# Patient Record
Sex: Female | Born: 1940 | Race: White | Hispanic: No | Marital: Married | Smoking: Never smoker
Health system: Southern US, Community
[De-identification: ages and names within clinical notes are randomized; demographics above are authoritative.]

## PROBLEM LIST (undated history)

## (undated) DIAGNOSIS — T7840XA Allergy, unspecified, initial encounter: Secondary | ICD-10-CM

## (undated) DIAGNOSIS — B019 Varicella without complication: Secondary | ICD-10-CM

## (undated) DIAGNOSIS — K921 Melena: Secondary | ICD-10-CM

## (undated) DIAGNOSIS — N390 Urinary tract infection, site not specified: Secondary | ICD-10-CM

## (undated) DIAGNOSIS — N189 Chronic kidney disease, unspecified: Secondary | ICD-10-CM

## (undated) DIAGNOSIS — J189 Pneumonia, unspecified organism: Secondary | ICD-10-CM

## (undated) DIAGNOSIS — N200 Calculus of kidney: Secondary | ICD-10-CM

## (undated) DIAGNOSIS — K5900 Constipation, unspecified: Secondary | ICD-10-CM

## (undated) DIAGNOSIS — K219 Gastro-esophageal reflux disease without esophagitis: Secondary | ICD-10-CM

## (undated) DIAGNOSIS — K449 Diaphragmatic hernia without obstruction or gangrene: Secondary | ICD-10-CM

## (undated) DIAGNOSIS — M1990 Unspecified osteoarthritis, unspecified site: Secondary | ICD-10-CM

## (undated) HISTORY — PX: DILATION AND CURETTAGE OF UTERUS: SHX78

## (undated) HISTORY — DX: Urinary tract infection, site not specified: N39.0

## (undated) HISTORY — PX: LITHOTRIPSY: SUR834

## (undated) HISTORY — DX: Melena: K92.1

## (undated) HISTORY — DX: Varicella without complication: B01.9

## (undated) HISTORY — DX: Calculus of kidney: N20.0

## (undated) HISTORY — DX: Allergy, unspecified, initial encounter: T78.40XA

---
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
## 1966-01-11 DIAGNOSIS — J189 Pneumonia, unspecified organism: Secondary | ICD-10-CM

## 1966-01-11 HISTORY — DX: Pneumonia, unspecified organism: J18.9

## 1998-10-20 ENCOUNTER — Encounter: Admission: RE | Admit: 1998-10-20 | Discharge: 1998-10-20 | Payer: Self-pay | Admitting: Obstetrics and Gynecology

## 1999-01-08 ENCOUNTER — Encounter: Payer: Self-pay | Admitting: Obstetrics and Gynecology

## 1999-01-08 ENCOUNTER — Encounter: Admission: RE | Admit: 1999-01-08 | Discharge: 1999-01-08 | Payer: Self-pay | Admitting: Obstetrics and Gynecology

## 2000-01-11 ENCOUNTER — Encounter: Admission: RE | Admit: 2000-01-11 | Discharge: 2000-01-11 | Payer: Self-pay | Admitting: Obstetrics and Gynecology

## 2000-01-11 ENCOUNTER — Encounter: Payer: Self-pay | Admitting: Obstetrics and Gynecology

## 2000-01-21 ENCOUNTER — Encounter: Payer: Self-pay | Admitting: Obstetrics and Gynecology

## 2000-01-21 ENCOUNTER — Encounter: Admission: RE | Admit: 2000-01-21 | Discharge: 2000-01-21 | Payer: Self-pay | Admitting: Obstetrics and Gynecology

## 2001-01-30 ENCOUNTER — Encounter: Admission: RE | Admit: 2001-01-30 | Discharge: 2001-01-30 | Payer: Self-pay | Admitting: Obstetrics and Gynecology

## 2001-01-30 ENCOUNTER — Encounter: Payer: Self-pay | Admitting: Obstetrics and Gynecology

## 2002-01-31 ENCOUNTER — Encounter: Admission: RE | Admit: 2002-01-31 | Discharge: 2002-01-31 | Payer: Self-pay | Admitting: Obstetrics and Gynecology

## 2002-01-31 ENCOUNTER — Encounter: Payer: Self-pay | Admitting: Obstetrics and Gynecology

## 2003-02-12 ENCOUNTER — Encounter: Admission: RE | Admit: 2003-02-12 | Discharge: 2003-02-12 | Payer: Self-pay | Admitting: Obstetrics and Gynecology

## 2004-02-17 ENCOUNTER — Encounter: Admission: RE | Admit: 2004-02-17 | Discharge: 2004-02-17 | Payer: Self-pay | Admitting: Obstetrics and Gynecology

## 2004-08-05 ENCOUNTER — Encounter (INDEPENDENT_AMBULATORY_CARE_PROVIDER_SITE_OTHER): Payer: Self-pay | Admitting: *Deleted

## 2004-08-05 ENCOUNTER — Ambulatory Visit (HOSPITAL_BASED_OUTPATIENT_CLINIC_OR_DEPARTMENT_OTHER): Admission: RE | Admit: 2004-08-05 | Discharge: 2004-08-05 | Payer: Self-pay | Admitting: Urology

## 2004-08-05 ENCOUNTER — Ambulatory Visit (HOSPITAL_COMMUNITY): Admission: RE | Admit: 2004-08-05 | Discharge: 2004-08-05 | Payer: Self-pay | Admitting: Urology

## 2005-02-19 ENCOUNTER — Encounter: Admission: RE | Admit: 2005-02-19 | Discharge: 2005-02-19 | Payer: Self-pay | Admitting: Obstetrics and Gynecology

## 2006-03-02 ENCOUNTER — Encounter: Admission: RE | Admit: 2006-03-02 | Discharge: 2006-03-02 | Payer: Self-pay | Admitting: Obstetrics and Gynecology

## 2007-02-02 ENCOUNTER — Encounter: Admission: RE | Admit: 2007-02-02 | Discharge: 2007-02-02 | Payer: Self-pay | Admitting: Gastroenterology

## 2007-03-14 ENCOUNTER — Encounter: Admission: RE | Admit: 2007-03-14 | Discharge: 2007-03-14 | Payer: Self-pay | Admitting: Obstetrics and Gynecology

## 2008-03-15 ENCOUNTER — Encounter: Admission: RE | Admit: 2008-03-15 | Discharge: 2008-03-15 | Payer: Self-pay | Admitting: Obstetrics and Gynecology

## 2009-04-02 ENCOUNTER — Encounter: Admission: RE | Admit: 2009-04-02 | Discharge: 2009-04-02 | Payer: Self-pay | Admitting: Obstetrics and Gynecology

## 2010-03-19 ENCOUNTER — Other Ambulatory Visit: Payer: Self-pay | Admitting: Obstetrics and Gynecology

## 2010-03-19 DIAGNOSIS — Z139 Encounter for screening, unspecified: Secondary | ICD-10-CM

## 2010-04-07 ENCOUNTER — Ambulatory Visit (HOSPITAL_COMMUNITY)
Admission: RE | Admit: 2010-04-07 | Discharge: 2010-04-07 | Disposition: A | Discharge status: Home / Self Care | Payer: 59 | Source: Ambulatory Visit | Attending: Obstetrics and Gynecology | Admitting: Obstetrics and Gynecology

## 2010-04-07 DIAGNOSIS — Z139 Encounter for screening, unspecified: Secondary | ICD-10-CM

## 2010-04-07 DIAGNOSIS — Z1231 Encounter for screening mammogram for malignant neoplasm of breast: Secondary | ICD-10-CM | POA: Insufficient documentation

## 2010-05-29 NOTE — Op Note (Signed)
[name], [name]              ACCOUNT NO.:  [id number]   MEDICAL RECORD NO.:  [id number]          PATIENT TYPE:  AMB   LOCATION:  [facility]                         FACILITY:  [facility]   PHYSICIAN:  Maretta Bees. Vonita Moss, M.D.DATE OF BIRTH:  Dec 17, 1940   DATE OF PROCEDURE:  08/05/2004  DATE OF DISCHARGE:                                 OPERATIVE REPORT   PREOPERATIVE DIAGNOSIS:  Microhematuria and positive NNP-22 with negative  cytologies.   POSTOPERATIVE DIAGNOSIS:  Microhematuria and positive NNP-22 with negative  cytologies.   PROCEDURE:  Cystoscopy, bilateral retrograde pyelograms with interpretation  and a collection of saline washings from bladder and both renal pelves.   SURGEON:  Maretta Bees. Vonita Moss, M.D.   ANESTHESIA:  General.   INDICATIONS:  This lady has had microhematuria and positive and NNP-22 but  negative cytologies, and she was scheduled for cystoscopy and retrograde  pyelograms and saline washings by Dr. Earlene Plater but Dr. Earlene Plater got involved in a  long, complicated case and asked me for that I would do the case for him as  long as the patient agreed.  As it turned out, her husband is a patient of  mine, and [name] agreed to proceed today as planned with Dr. Earlene Plater.   DESCRIPTION OF PROCEDURE:  The patient brought to the operating room, placed  in the lithotomy position. External genitalia were prepped and draped in  usual fashion.  She was cystoscoped.  The bladder was absolutely normal with  no stones, tumors or inflammatory lesions.  Saline washings were obtained by  barbotage with a Toomey syringe from the bladder and sent to pathology for  cytology.  A 5-French whistle tip ureteral catheter was easily placed up the  left ureter to the left renal pelvis, and I obtained urine from the catheter  in addition to saline washings, all sent together in a separate specimen cup  for cytology.   I then performed a left retrograde pyelogram that showed no obstruction or  filling defects in the pyelocaliceal system or the ureter. In like fashion,  the ureteral catheter was placed up into the right renal pelvis and saline  washings by barbotage again sent for cytology and a right retrograde  pyelogram was obtained showing normal pyelocaliceal system and ureter on the  right side.  At this point the bladder was emptied, the scope removed and  the patient sent to recovery room in good condition having tolerated the  procedure well.     _________________    LJP/MEDQ  D:  08/05/2004  T:  08/06/2004  Job:  [id number]   cc:   S. Kyra Manges, M.D.  [phone] [address]  [place]  [place] [address]  Fax: [phone]

## 2011-03-16 ENCOUNTER — Other Ambulatory Visit (HOSPITAL_COMMUNITY): Payer: Self-pay | Admitting: Family Medicine

## 2011-03-16 ENCOUNTER — Other Ambulatory Visit: Payer: Self-pay

## 2011-03-16 ENCOUNTER — Other Ambulatory Visit: Payer: Self-pay | Admitting: Family Medicine

## 2011-03-16 DIAGNOSIS — Z139 Encounter for screening, unspecified: Secondary | ICD-10-CM

## 2011-04-09 ENCOUNTER — Ambulatory Visit (HOSPITAL_COMMUNITY): Payer: Self-pay

## 2011-04-12 ENCOUNTER — Ambulatory Visit (HOSPITAL_COMMUNITY)
Admission: RE | Admit: 2011-04-12 | Discharge: 2011-04-12 | Disposition: A | Discharge status: Home / Self Care | Payer: MEDICARE | Source: Ambulatory Visit | Attending: Family Medicine | Admitting: Family Medicine

## 2011-04-12 DIAGNOSIS — Z139 Encounter for screening, unspecified: Secondary | ICD-10-CM

## 2011-04-12 DIAGNOSIS — Z1231 Encounter for screening mammogram for malignant neoplasm of breast: Secondary | ICD-10-CM | POA: Insufficient documentation

## 2011-07-28 ENCOUNTER — Other Ambulatory Visit: Payer: Self-pay | Admitting: Gastroenterology

## 2011-08-05 ENCOUNTER — Emergency Department (HOSPITAL_COMMUNITY)
Admission: EM | Admit: 2011-08-05 | Discharge: 2011-08-05 | Disposition: A | Discharge status: Home / Self Care | Payer: Medicare Other | Attending: Emergency Medicine | Admitting: Emergency Medicine

## 2011-08-05 ENCOUNTER — Encounter (HOSPITAL_COMMUNITY): Payer: Self-pay | Admitting: Emergency Medicine

## 2011-08-05 ENCOUNTER — Emergency Department (HOSPITAL_COMMUNITY): Payer: Medicare Other

## 2011-08-05 DIAGNOSIS — Z8739 Personal history of other diseases of the musculoskeletal system and connective tissue: Secondary | ICD-10-CM | POA: Insufficient documentation

## 2011-08-05 DIAGNOSIS — K219 Gastro-esophageal reflux disease without esophagitis: Secondary | ICD-10-CM | POA: Insufficient documentation

## 2011-08-05 DIAGNOSIS — N200 Calculus of kidney: Secondary | ICD-10-CM | POA: Insufficient documentation

## 2011-08-05 HISTORY — DX: Diaphragmatic hernia without obstruction or gangrene: K44.9

## 2011-08-05 HISTORY — DX: Unspecified osteoarthritis, unspecified site: M19.90

## 2011-08-05 HISTORY — DX: Gastro-esophageal reflux disease without esophagitis: K21.9

## 2011-08-05 HISTORY — DX: Constipation, unspecified: K59.00

## 2011-08-05 LAB — CBC WITH DIFFERENTIAL/PLATELET
Basophils Absolute: 0 10*3/uL (ref 0.0–0.1)
Basophils Relative: 0 % (ref 0–1)
Eosinophils Relative: 0 % (ref 0–5)
Lymphocytes Relative: 10 % — ABNORMAL LOW (ref 12–46)
Monocytes Absolute: 0.3 10*3/uL (ref 0.1–1.0)
Monocytes Relative: 3 % (ref 3–12)
Neutro Abs: 10.1 10*3/uL — ABNORMAL HIGH (ref 1.7–7.7)
Neutrophils Relative %: 87 % — ABNORMAL HIGH (ref 43–77)
RDW: 12.7 % (ref 11.5–15.5)
WBC: 11.6 10*3/uL — ABNORMAL HIGH (ref 4.0–10.5)

## 2011-08-05 LAB — URINE MICROSCOPIC-ADD ON

## 2011-08-05 LAB — COMPREHENSIVE METABOLIC PANEL
ALT: 15 U/L (ref 0–35)
AST: 24 U/L (ref 0–37)
Albumin: 3.9 g/dL (ref 3.5–5.2)
Creatinine, Ser: 0.92 mg/dL (ref 0.50–1.10)
GFR calc Af Amer: 70 mL/min — ABNORMAL LOW (ref >90)
Glucose, Bld: 115 mg/dL — ABNORMAL HIGH (ref 70–99)
Potassium: 4.3 mEq/L (ref 3.5–5.1)
Total Bilirubin: 0.3 mg/dL (ref 0.3–1.2)

## 2011-08-05 LAB — URINALYSIS, ROUTINE W REFLEX MICROSCOPIC
Bilirubin Urine: NEGATIVE
Glucose, UA: NEGATIVE mg/dL
Urobilinogen, UA: 0.2 mg/dL (ref 0.0–1.0)
pH: 7 (ref 5.0–8.0)

## 2011-08-05 MED ORDER — KETOROLAC TROMETHAMINE 30 MG/ML IJ SOLN
30.0000 mg | Freq: Once | INTRAMUSCULAR | Status: AC
  Administered 2011-08-05: 30 mg via INTRAVENOUS
  Filled 2011-08-05: qty 1

## 2011-08-05 MED ORDER — ONDANSETRON HCL 4 MG/2ML IJ SOLN
4.0000 mg | Freq: Once | INTRAMUSCULAR | Status: AC
  Administered 2011-08-05: 4 mg via INTRAVENOUS
  Filled 2011-08-05: qty 2

## 2011-08-05 MED ORDER — MORPHINE SULFATE 2 MG/ML IJ SOLN: 2.0000 mg | Freq: Once | INTRAMUSCULAR | Status: DC

## 2011-08-05 MED ORDER — ONDANSETRON HCL 4 MG/2ML IJ SOLN: 4.0000 mg | Freq: Once | INTRAMUSCULAR | Status: DC

## 2011-08-05 MED ORDER — FENTANYL CITRATE 0.05 MG/ML IJ SOLN
50.0000 ug | Freq: Once | INTRAMUSCULAR | Status: AC
  Administered 2011-08-05: 50 ug via INTRAVENOUS
  Filled 2011-08-05: qty 2

## 2011-08-05 MED ORDER — ONDANSETRON 8 MG PO TBDP: 8.0000 mg | ORAL_TABLET | Freq: Three times a day (TID) | ORAL | Status: AC | PRN

## 2011-08-05 MED ORDER — SODIUM CHLORIDE 0.9 % IV BOLUS (SEPSIS)
1000.0000 mL | Freq: Once | INTRAVENOUS | Status: AC
  Administered 2011-08-05: 1000 mL via INTRAVENOUS

## 2011-08-05 MED ORDER — HYDROCODONE-ACETAMINOPHEN 5-325 MG PO TABS: ORAL_TABLET | ORAL | Status: DC

## 2011-08-05 NOTE — ED Notes (Signed)
Pt states that she ate lunch today at work and started having LUQ pain that wraps around to back. States she ate cantaloupe and a salad. Vomitted after lunch as well. States doctor just put her on prilosec for acid reflux and just found out she has a hiatal hernia.

## 2011-08-05 NOTE — ED Notes (Signed)
Pt states that she found out she had a "twist/kink" in her intestines about a year and half ago that her dr placed her on miralax for.

## 2011-08-06 NOTE — ED Provider Notes (Signed)
History     CSN: [id number]  Arrival date & time 08/05/11  1709   First MD Initiated Contact with Patient 08/05/11 1824      Chief Complaint  Patient presents with  . Abdominal Pain  . Emesis    (Consider location/radiation/quality/duration/timing/severity/associated sxs/prior treatment) HPI Patient is a 71 yo female who complains of left flank pain radiating into the left abdomen.  She rates this as an 8/10 and reports that is is sharp and began acutely 4 hours ago.  She endorses nausea but denies vomiting and denies any other GI symptoms.  She has no sick contacts and denies fevers.  Patient denies any urinary symptoms.  She knows she has history of having a kidney stone but reports this has just been seen in radiographic studies.  Nothing has made her symptoms better or worse.  No recent cough or fevers.  There are no other associated or modifying factors.  Past Medical History  Diagnosis Date  . Acid reflux   . Hiatal hernia   . Constipation   . 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 Dilation and curettage of uterus     History reviewed. No pertinent family history.  History  Substance Use Topics  . Smoking status: Never Smoker   . Smokeless tobacco: Not on file  . Alcohol Use: No    OB History    Grav Para Term Preterm Abortions TAB SAB Ect Mult Living                  Review of Systems  Constitutional: Negative.   HENT: Negative.   Eyes: Negative.   Respiratory: Negative.   Cardiovascular: Negative.   Gastrointestinal: Positive for nausea and vomiting.  Genitourinary: Positive for flank pain.  Musculoskeletal: Negative.   Skin: Negative.   Neurological: Negative.   Hematological: Negative.   Psychiatric/Behavioral: Negative.   All other systems reviewed and are negative.    Allergies  Penicillins  Home Medications   Current Outpatient Rx  Name Route Sig Dispense Refill  . CALCIUM CARBONATE 1250 MG PO CHEW Oral  Chew 1 tablet by mouth 2 (two) times daily.    Marland Kitchen NAPROXEN SODIUM 220 MG PO TABS Oral Take 440 mg by mouth daily as needed. For pain.    Marland Kitchen OMEPRAZOLE 20 MG PO CPDR Oral Take 40 mg by mouth daily.    Marland Kitchen POLYETHYLENE GLYCOL 3350 PO PACK Oral Take 17 g by mouth daily as needed. For constipation.    Marland Kitchen HYDROCODONE-ACETAMINOPHEN 5-325 MG PO TABS  Take 1-2 tabs po q 6 hours PRN pain. 15 tablet 0  . ONDANSETRON 8 MG PO TBDP Oral Take 1 tablet (8 mg total) by mouth every 8 (eight) hours as needed for nausea. 20 tablet 0    BP 107/62  Pulse 76  Resp 19  Ht 5\' 7"  (1.702 m)  Wt 157 lb (71.215 kg)  BMI 24.59 kg/m2  SpO2 97%  Physical Exam  Nursing note and vitals reviewed. GEN: Well-developed, well-nourished female in no distress HEENT: Atraumatic, normocephalic. Oropharynx clear without erythema EYES: PERRLA BL, no scleral icterus. NECK: Trachea midline, no meningismus CV: regular rate and rhythm. No murmurs, rubs, or gallops PULM: No respiratory distress.  No crackles, wheezes, or rales. GI: soft, non-tender. No guarding, rebound, or tenderness. + bowel sounds  GU: left CVAT Neuro: cranial nerves grossly 2-12 intact, no abnormalities of strength or sensation, A and O x  3 MSK: Patient moves all 4 extremities symmetrically, no deformity, edema, or injury noted Skin: No rashes petechiae, purpura, or jaundice Psych: no abnormality of mood   ED Course  Procedures (including critical care time)  Indication: protocol Please note this EKG was reviewed extemporaneously by myself.   Date: 08/05/2011  Rate: 70  Rhythm: sinus arrhythmia  QRS Axis: normal  Intervals: normal  ST/T Wave abnormalities: nonspecific T wave changes  Conduction Disutrbances:none  Narrative Interpretation:   Old EKG Reviewed: unchanged      Labs Reviewed  CBC WITH DIFFERENTIAL - Abnormal; Notable for the following:    WBC 11.6 (*)     Neutrophils Relative 87 (*)     Neutro Abs 10.1 (*)     Lymphocytes Relative  10 (*)     All other components within normal limits  COMPREHENSIVE METABOLIC PANEL - Abnormal; Notable for the following:    Glucose, Bld 115 (*)     GFR calc non Af Amer 61 (*)     GFR calc Af Amer 70 (*)     All other components within normal limits  URINALYSIS, ROUTINE W REFLEX MICROSCOPIC - Abnormal; Notable for the following:    APPearance CLOUDY (*)     Hgb urine dipstick LARGE (*)     Ketones, ur 15 (*)     Leukocytes, UA SMALL (*)     All other components within normal limits  LIPASE, BLOOD  URINE MICROSCOPIC-ADD ON  URINE CULTURE   Ct Abdomen Pelvis Wo Contrast  08/05/2011  *RADIOLOGY REPORT*  Clinical Data: Left upper quadrant abdominal and back pain today.  CT ABDOMEN AND PELVIS WITHOUT CONTRAST  Technique:  Multidetector CT imaging of the abdomen and pelvis was performed following the standard protocol without intravenous contrast.  Comparison: CT 01/30/2010.  Findings: There is linear atelectasis or scarring in both lung bases.  The patient has developed moderate hydronephrosis and mild hydroureter on the left with associated perinephric soft tissue stranding.  The previously noted small calculus in the lower pole of the left kidney is now positioned dependently within the left renal pelvis (image 26).  This does not appear to be obstructing the UPJ currently.  No ureteral or bladder calculi are seen.  The right kidney has a stable appearance without nephrolithiasis or hydronephrosis.  A small to moderate hiatal hernia is noted.  The liver, spleen, gallbladder, pancreas and adrenal glands appear unremarkable.  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 There is no evidence of obstruction.  No other inflammatory changes are seen.  The uterus and ovaries appear normal.  No acute osseous findings are seen.  IMPRESSION:  1.  Left-sided hydronephrosis and perinephric soft tissue stranding consistent with acute/recent ureteral obstruction.  A specific etiology is not  demonstrated.  There is a 3 mm calculus dependently in the left renal pelvis which could intermittently obstruct the UPJ.  Alternately, the obstruction could be secondary to blood clot. 2.  No other urinary tract calculi identified. 3.  No other significant findings.  Original Report Authenticated By: Gerrianne Scale, M.D.     1. Nephrolithiasis       MDM  Patient was evaluated by myself.  After pain and nausea medicines she felt better.  UA showed hematuria but the rest of the labs were unremarkable.  Patient did have a 3 mm stone seen on CT and findings to suggest she had passed a stone.  Patient was referred to urology and discharged in  good condition with nausea and pain meds in case symptoms return.          Cyndra Numbers, MD 08/06/11 [phone]

## 2012-02-03 ENCOUNTER — Other Ambulatory Visit: Payer: Self-pay | Admitting: Gastroenterology

## 2012-02-03 DIAGNOSIS — K5900 Constipation, unspecified: Secondary | ICD-10-CM

## 2012-02-03 DIAGNOSIS — R1011 Right upper quadrant pain: Secondary | ICD-10-CM

## 2012-02-16 ENCOUNTER — Ambulatory Visit
Admission: RE | Admit: 2012-02-16 | Discharge: 2012-02-16 | Disposition: A | Discharge status: Home / Self Care | Payer: Medicare Other | Source: Ambulatory Visit | Attending: Gastroenterology | Admitting: Gastroenterology

## 2012-02-16 DIAGNOSIS — K5900 Constipation, unspecified: Secondary | ICD-10-CM

## 2012-02-16 DIAGNOSIS — R1011 Right upper quadrant pain: Secondary | ICD-10-CM

## 2012-03-20 ENCOUNTER — Other Ambulatory Visit: Payer: Self-pay

## 2012-03-20 DIAGNOSIS — Z1231 Encounter for screening mammogram for malignant neoplasm of breast: Secondary | ICD-10-CM

## 2012-04-19 ENCOUNTER — Ambulatory Visit
Admission: RE | Admit: 2012-04-19 | Discharge: 2012-04-19 | Disposition: A | Discharge status: Home / Self Care | Payer: Medicare Other | Source: Ambulatory Visit

## 2012-04-19 DIAGNOSIS — Z1231 Encounter for screening mammogram for malignant neoplasm of breast: Secondary | ICD-10-CM

## 2013-03-27 ENCOUNTER — Other Ambulatory Visit: Payer: Self-pay

## 2013-03-27 DIAGNOSIS — Z1231 Encounter for screening mammogram for malignant neoplasm of breast: Secondary | ICD-10-CM

## 2013-04-24 ENCOUNTER — Ambulatory Visit
Admission: RE | Admit: 2013-04-24 | Discharge: 2013-04-24 | Disposition: A | Discharge status: Home / Self Care | Payer: Medicare Other | Source: Ambulatory Visit

## 2013-04-24 DIAGNOSIS — Z1231 Encounter for screening mammogram for malignant neoplasm of breast: Secondary | ICD-10-CM

## 2014-02-21 ENCOUNTER — Other Ambulatory Visit: Payer: Self-pay | Admitting: Urology

## 2014-03-14 ENCOUNTER — Encounter (HOSPITAL_COMMUNITY): Payer: Self-pay | Admitting: *Deleted

## 2014-03-18 ENCOUNTER — Encounter (HOSPITAL_COMMUNITY)
Admission: RE | Disposition: A | Discharge status: Home / Self Care | Payer: Self-pay | Source: Ambulatory Visit | Attending: Urology

## 2014-03-18 ENCOUNTER — Ambulatory Visit (HOSPITAL_COMMUNITY)
Admission: RE | Admit: 2014-03-18 | Discharge: 2014-03-18 | Disposition: A | Discharge status: Home / Self Care | Payer: Medicare Other | Source: Ambulatory Visit | Attending: Urology | Admitting: Urology

## 2014-03-18 ENCOUNTER — Encounter (HOSPITAL_COMMUNITY): Payer: Self-pay | Admitting: *Deleted

## 2014-03-18 ENCOUNTER — Ambulatory Visit (HOSPITAL_COMMUNITY): Payer: Medicare Other

## 2014-03-18 DIAGNOSIS — N200 Calculus of kidney: Secondary | ICD-10-CM | POA: Diagnosis not present

## 2014-03-18 DIAGNOSIS — Z79899 Other long term (current) drug therapy: Secondary | ICD-10-CM | POA: Diagnosis not present

## 2014-03-18 DIAGNOSIS — Z87442 Personal history of urinary calculi: Secondary | ICD-10-CM | POA: Diagnosis not present

## 2014-03-18 HISTORY — DX: Pneumonia, unspecified organism: J18.9

## 2014-03-18 HISTORY — DX: Chronic kidney disease, unspecified: N18.9

## 2014-03-18 SURGERY — LITHOTRIPSY, ESWL
Anesthesia: LOCAL | Laterality: Left

## 2014-03-18 MED ORDER — DIAZEPAM 5 MG PO TABS
10.0000 mg | ORAL_TABLET | ORAL | Status: AC
  Administered 2014-03-18: 10 mg via ORAL
  Filled 2014-03-18: qty 2

## 2014-03-18 MED ORDER — DIPHENHYDRAMINE HCL 25 MG PO CAPS
25.0000 mg | ORAL_CAPSULE | ORAL | Status: AC
  Administered 2014-03-18: 25 mg via ORAL
  Filled 2014-03-18: qty 1

## 2014-03-18 MED ORDER — CIPROFLOXACIN HCL 500 MG PO TABS
500.0000 mg | ORAL_TABLET | ORAL | Status: AC
  Administered 2014-03-18: 500 mg via ORAL
  Filled 2014-03-18: qty 1

## 2014-03-18 MED ORDER — DEXTROSE-NACL 5-0.45 % IV SOLN
INTRAVENOUS | Status: DC
  Administered 2014-03-18: 08:00:00 via INTRAVENOUS

## 2014-03-18 NOTE — H&P (Signed)
Reason For Visit Cystoscopy   Active Problems Problems  1. Microscopic hematuria (R31.2)   Assessed By: Carolan Clines (Urology); Last Assessed: 20 Feb 2014 2. Nephrolithiasis (N20.0)  History of Present Illness     74 YO female returns today for a cystoscopy for hx of microhematuria. She has a hx of bilateral retrograde pyelogram pyelograms in 2006 ( Dr. Terance Hart) because of abnormal cytologies, with negative evaluation; and CT in 2012 ( Dr. Rosana Hoes) in 2012 showed L renal pelvic stone measuring 4.51mm.    She was last seen for right flank pain and dysuria by Dr. Jeffie Pollock. No definitive right ureteral stone noted. Occasional Pepsi. Water: 3 glasses/day + bottle/day.     Seen 02/04/14 with the complaint of right flank pain followed by dark urine. She took Cipro that she had on hand.  She has has mild urgency as well. She had no nausea. She has a prior history of stones with a left renal pelvic stone in 2013.   Past Medical History Problems  1. History of Calculus of left ureter (N20.1) 2. History of Calculus of ureter (N20.1) 3. History of Essential Hematuria 4. History of kidney stones (Z87.442) 5. History of Microscopic hematuria (R31.2)  Surgical History Problems  1. History of Colonoscopy (Fiberoptic) 2. History of Cystoscopy (Diagnostic)  Current Meds 1. Biotin 10 MG Oral Tablet;  Therapy: (Recorded:15Jan2013) to Recorded 2. Calcium TABS;  Therapy: (Recorded:06Oct2008) to Recorded 3. MiraLax Oral Powder;  Therapy: (Recorded:15Jan2013) to Recorded 4. Ranitidine TABS;  Therapy: (Recorded:25Jan2016) to Recorded 5. Tamsulosin HCl - 0.4 MG Oral Capsule; TAKE 1 CAPSULE Daily;  Therapy: [id number] to (Evaluate:24Apr2016)  Requested for: 25Jan2016;  Last Rx:25Jan2016 Ordered 6. TraMADol HCl - 50 MG Oral Tablet; 1 po q4-6 hours prn for pain;  Therapy: [id number] to (Last Rx:25Jan2016) Ordered 7. Uribel 118 MG Oral Capsule; take 1 capsule   4  times a day prn;  Therapy:  [id number] to (Last Rx:25Jan2016) Ordered  Allergies Medication  1. Penicillins  Family History Problems  1. Family history of Death In The Family Father : Father   age 60  of pneumonia 2. Family history of Death In The Family Mother : Mother   age 54; pneumonia and congestive heart failure 3. Family history of Family Health Status Number Of Children   2 daughters  Social History Problems  1. Denied: Alcohol Use 2. Caffeine Use   2 a day 3. Marital History - Currently Married 4. Never A Smoker 5. Occupation:   Psychologist, counselling 6. Denied: Tobacco Use  Review of Systems Genitourinary, constitutional, skin, eye, otolaryngeal, hematologic/lymphatic, cardiovascular, pulmonary, endocrine, musculoskeletal, gastrointestinal, neurological and psychiatric system(s) were reviewed and pertinent findings if present are noted and are otherwise negative.  Genitourinary: urinary frequency, urinary urgency, dysuria and hematuria, but no pelvic pain.  Gastrointestinal: flank pain and constipation.    Physical Exam Constitutional: Well nourished and well developed . No acute distress.  ENT:. The ears and nose are normal in appearance.  Neck: The appearance of the neck is normal and no neck mass is present.  Pulmonary: No respiratory distress and normal respiratory rhythm and effort.  Cardiovascular: Heart rate and rhythm are normal . No peripheral edema.  Abdomen: The abdomen is rounded. The abdomen is soft and nontender. No masses are palpated. No CVA tenderness. No hernias are palpable. No hepatosplenomegaly noted.  Genitourinary:  Chaperone Present: kim lewis.  Examination of the external genitalia shows normal female external genitalia, no vulvar atrophy and no  lesions. The urethra is normal in appearance and not tender. There is no urethral mass. Urethral hypermobility is present. Vaginal exam demonstrates the vaginal epithelium to be poorly estrogenized, but no abnormalities, no  uterine prolapse and no mass. No cystocele is identified. No enterocele is identified. No rectocele is identified. There is no evidence of a vesicovaginal fistula. The uterus is without abnormalities and non tender. The adnexa are palpably normal. The bladder is non tender and not distended. The anus is normal on inspection. The perineum is normal on inspection.  Lymphatics: The femoral and inguinal nodes are not enlarged or tender.  Skin: Normal skin turgor, no visible rash and no visible skin lesions.  Neuro/Psych:. Mood and affect are appropriate.    Results/Data  Urine [Data Includes: Last 1 Day]   [id number]  COLOR YELLOW   APPEARANCE CLEAR   SPECIFIC GRAVITY 1.010   pH 7.0   GLUCOSE NEG mg/dL  BILIRUBIN NEG   KETONE NEG mg/dL  BLOOD NEG   PROTEIN NEG mg/dL  UROBILINOGEN 0.2 mg/dL  NITRITE NEG   LEUKOCYTE ESTERASE SMALL   SQUAMOUS EPITHELIAL/HPF RARE   WBC 0-2 WBC/hpf  RBC 0-2 RBC/hpf  BACTERIA RARE   CRYSTALS NONE SEEN   CASTS NONE SEEN   Selected Results  AU CT-STONE PROTOCOL [id number] 12:00AM Jimmey Ralph   Test Name Result Flag Reference  CT-STONE PROTOCOL (Report)    ** RADIOLOGY REPORT BY [facility] **   CLINICAL DATA: Microhematuria  EXAM: CT ABDOMEN AND PELVIS WITHOUT CONTRAST  TECHNIQUE: Multidetector CT imaging of the abdomen and pelvis was performed following the standard protocol without IV contrast.  COMPARISON: 02/16/2012  FINDINGS: Lower chest: There is no pleural or pericardial effusion identified. The lung bases appear clear.  Hepatobiliary: No suspicious liver abnormality. The gallbladder appears normal. No biliary dilatation.  Pancreas: Normal appearance of the pancreas.  Spleen: The spleen is normal.  Adrenals/Urinary Tract: The adrenal glands are both within normal limits. Normal appearance of the right kidney. There is left pelvocaliectasis. Within the dependent portion of the mildly dilated extra renal pelvis there is  a stone measuring 5 mm, image 21/series 2. The urinary bladder appears normal.  Stomach/Bowel: Moderate to large hiatal hernia. The small bowel loops have a normal course and caliber. No obstruction. Most of the colon is left of the midline compatible with a new malrotation deformity. This is similar to the previous exam. No obstruction.  Vascular/Lymphatic: Calcified atherosclerotic disease involves the abdominal aorta. No aneurysm. No enlarged retroperitoneal or mesenteric adenopathy. No enlarged pelvic or inguinal lymph nodes.  Reproductive: The uterus and adnexal structures have a normal appearance for patient's age.  Other: There is no ascites or focal fluid collections within the abdomen or pelvis.  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  IMPRESSION: 1. Left pelvocaliectasis. Stone within the dependent portion of the mildly dilated extrarenal pelvis measures 5 mm. 2. Atherosclerotic disease.   Electronically Signed  By: Kerby Moors M.D.  On: 02/15/2014 11:34    20 Feb 2014 7:47 AM  UA With REFLEX    COLOR YELLOW     APPEARANCE CLEAR     SPECIFIC GRAVITY 1.010     pH 7.0     GLUCOSE NEG     BILIRUBIN NEG     KETONE NEG     BLOOD NEG     PROTEIN NEG     UROBILINOGEN 0.2     NITRITE NEG     LEUKOCYTE  ESTERASE SMALL     SQUAMOUS EPITHELIAL/HPF RARE     WBC 0-2     CRYSTALS NONE SEEN     CASTS NONE SEEN     RBC 0-2     BACTERIA RARE  Procedure  Procedure: Cystoscopy  Chaperone Present: kim lewis.  Indication: Hematuria.  Informed Consent: Risks, benefits, and potential adverse events were discussed and informed consent was obtained from the patient.  Prep: The patient was prepped with betadine.  Anesthesia:. Local anesthesia was administered intraurethrally with 2% lidocaine jelly.  Antibiotic prophylaxis: Ciprofloxacin.  Procedure Note:  Urethral meatus:. No abnormalities.  Anterior urethra: No  abnormalities.  Prostatic urethra: No abnormalities.  Bladder: Visulization was clear. The ureteral orifices were in the normal anatomic position bilaterally and had clear efflux of urine. A systematic survey of the bladder demonstrated no bladder tumors or stones. The mucosa was smooth without abnormalities. The patient tolerated the procedure well.  Complications: None.    Assessment Assessed  1. Nephrolithiasis (N20.0) 2. Microscopic hematuria (R31.2)  Microhematuria 2ndary to 30mm stone in the L extrarenal pelvis. She has caliectasis also, with hx of negative evaluation by Dr. Terance Hart and Dr. Rosana Hoes. Joaquim Lai is at risk of moving into the upper ureter. No flank pain today. We have discussed her x-rays and her hematuria and her findings. I have recommended lithotripsy because it is less invasive, and should be efficient in powdering a stone of this size ( probably ca-oxalate). She will stop ASA 1 week before her surgery. No medical problems.   Plan Nephrolithiasis  1. Follow-up Schedule Surgery Office  Follow-up  Status: Hold For -  Appointment  Requested for: [phone]  Lithotripsy of L renal pelvic stone.   Discussion/Summary cc: Dr. Levin Bacon   Signatures Electronically signed by : Carolan Clines, M.D.; Feb 20 2014  8:45AM EST

## 2014-03-18 NOTE — Discharge Instructions (Signed)
Lithotripsy for Kidney Stones °Lithotripsy is a treatment that can sometimes help eliminate kidney stones and pain that they cause. A form of lithotripsy, also known as extracorporeal shock wave lithotripsy, is a nonsurgical procedure that helps your body rid itself of the kidney stone when it is too big to pass on its own. Extracorporeal shock wave lithotripsy is a method of crushing a kidney stone with shock waves. These shock waves pass through your body and are focused on your stone. They cause the kidney stones to crumble while still in the urinary tract. It is then easier for the smaller pieces of stone to pass in the urine. °Lithotripsy usually takes about an hour. It is done in a hospital, a lithotripsy center, or a mobile unit. It usually does not require an overnight stay. Your health care provider will instruct you on preparation for the procedure. Your health care provider will tell you what to expect afterward. °LET YOUR HEALTH CARE PROVIDER KNOW ABOUT: °· Any allergies you have. °· All medicines you are taking, including vitamins, herbs, eye drops, creams, and over-the-counter medicines. °· Previous problems you or members of your family have had with the use of anesthetics. °· Any blood disorders you have. °· Previous surgeries you have had. °· Medical conditions you have. °RISKS AND COMPLICATIONS °Generally, lithotripsy for kidney stones is a safe procedure. However, as with any procedure, complications can occur. Possible complications include: °· Infection. °· Bleeding of the kidney. °· Bruising of the kidney or skin. °· Obstruction of the ureter. °· Failure of the stone to fragment. °BEFORE THE PROCEDURE °· Do not eat or drink for 6-8 hours prior to the procedure. You may, however, take the medications with a sip of water that your physician instructs you to take °· Do not take aspirin or aspirin-containing products for 7 days prior to your procedure °· Do not take nonsteroidal anti-inflammatory  products for 7 days prior to your procedure °PROCEDURE °A stent (flexible tube with holes) may be placed in your ureter. The ureter is the tube that transports the urine from the kidneys to the bladder. Your health care provider may place a stent before the procedure. This will help keep urine flowing from the kidney if the fragments of the stone block the ureter. You may have an IV tube placed in one of your veins to give you fluids and medicines. These medicines may help you relax or make you sleep. During the procedure, you will lie comfortably on a fluid-filled cushion or in a warm-water bath. After an X-ray or ultrasound exam to locate your stone, shock waves are aimed at the stone. If you are awake, you may feel a tapping sensation as the shock waves pass through your body. If large stone particles remain after treatment, a second procedure may be necessary at a later date. °For comfort during the test: °· Relax as much as possible. °· Try to remain still as much as possible. °· Try to follow instructions to speed up the test. °· Let your health care provider know if you are uncomfortable, anxious, or in pain. °AFTER THE PROCEDURE  °After surgery, you will be taken to the recovery area. A nurse will watch and check your progress. Once you're awake, stable, and taking fluids well, you will be allowed to go home as long as there are no problems. You will also be allowed to pass your urine before discharge. You may be given antibiotics to help prevent infection. You may also be prescribed   pain medicine if needed. In a week or two, your health care provider may remove your stent, if you have one. You may first have an X-ray exam to check on how successful the fragmentation of your stone has been and how much of the stone has passed. Your health care provider will check to see whether or not stone particles remain. °SEEK IMMEDIATE MEDICAL CARE IF: °· You develop a fever or shaking chills. °· Your pain is not  relieved by medicine. °· You feel sick to your stomach (nauseated) and you vomit. °· You develop heavy bleeding. °· You have difficulty urinating. °· You start to pass your stent from your penis. °Document Released: 12/26/1999 Document Revised: 10/18/2012 Document Reviewed: 07/13/2012 °Exit[employer]® Patient Information ©2015 [employer]. This information is not intended to replace advice given to you by your health care provider. Make sure you discuss any questions you have with your health care provider. ° °

## 2014-03-18 NOTE — Interval H&P Note (Signed)
History and Physical Interval Note:  03/18/2014 8:48 AM  Tanya Swanson  has presented today for surgery, with the diagnosis of LEFT RENAL PELVIC STONE  The various methods of treatment have been discussed with the patient and family. After consideration of risks, benefits and other options for treatment, the patient has consented to  Procedure(s): LEFT EXTRACORPOREAL SHOCK WAVE LITHOTRIPSY (ESWL) (Left) as a surgical intervention .  The patient's history has been reviewed, patient examined, no change in status, stable for surgery.  I have reviewed the patient's chart and labs.  Questions were answered to the patient's satisfaction.     Carolan Clines I

## 2014-03-20 ENCOUNTER — Other Ambulatory Visit: Payer: Self-pay

## 2014-03-20 DIAGNOSIS — Z1231 Encounter for screening mammogram for malignant neoplasm of breast: Secondary | ICD-10-CM

## 2014-04-26 ENCOUNTER — Ambulatory Visit
Admission: RE | Admit: 2014-04-26 | Discharge: 2014-04-26 | Disposition: A | Discharge status: Home / Self Care | Payer: Medicare Other | Source: Ambulatory Visit

## 2014-04-26 DIAGNOSIS — Z1231 Encounter for screening mammogram for malignant neoplasm of breast: Secondary | ICD-10-CM

## 2015-01-13 DIAGNOSIS — H40013 Open angle with borderline findings, low risk, bilateral: Secondary | ICD-10-CM | POA: Diagnosis not present

## 2015-01-13 DIAGNOSIS — H5203 Hypermetropia, bilateral: Secondary | ICD-10-CM | POA: Diagnosis not present

## 2015-01-13 DIAGNOSIS — H2513 Age-related nuclear cataract, bilateral: Secondary | ICD-10-CM | POA: Diagnosis not present

## 2015-01-14 DIAGNOSIS — H2511 Age-related nuclear cataract, right eye: Secondary | ICD-10-CM | POA: Diagnosis not present

## 2015-02-10 DIAGNOSIS — L570 Actinic keratosis: Secondary | ICD-10-CM | POA: Diagnosis not present

## 2015-02-10 DIAGNOSIS — X32XXXD Exposure to sunlight, subsequent encounter: Secondary | ICD-10-CM | POA: Diagnosis not present

## 2015-02-10 DIAGNOSIS — D225 Melanocytic nevi of trunk: Secondary | ICD-10-CM | POA: Diagnosis not present

## 2015-02-10 DIAGNOSIS — L821 Other seborrheic keratosis: Secondary | ICD-10-CM | POA: Diagnosis not present

## 2015-02-17 NOTE — Patient Instructions (Signed)
Your procedure is scheduled on:  02/24/2015               Report to [facility] at  6:15   AM.  Call this number if you have problems the morning of surgery: [phone]   Remember:   Do not eat or drink :After Midnight.    Take these medicines the morning of surgery with A SIP OF WATER:    Zactac         Do not wear jewelry, make-up or nail polish.  Do not wear lotions, powders, or perfumes. You may wear deodorant.  Do not shave 48 hours prior to surgery.  Do not bring valuables to the hospital.  Contacts, dentures or bridgework may not be worn into surgery.  Patients discharged the day of surgery will not be allowed to drive home.  Name and phone number of your driver:    @[email or web address] @ Cataract Surgery  A cataract is a clouding of the lens of the eye. When a lens becomes cloudy, vision is reduced based on the degree and nature of the clouding. Surgery may be needed to improve vision. Surgery removes the cloudy lens and usually replaces it with a substitute lens (intraocular lens, IOL). LET YOUR EYE DOCTOR KNOW ABOUT:  Allergies to food or medicine.   Medicines taken including herbs, eyedrops, over-the-counter medicines, and creams.   Use of steroids (by mouth or creams).   Previous problems with anesthetics or numbing medicine.   History of bleeding problems or blood clots.   Previous surgery.   Other health problems, including diabetes and kidney problems.   Possibility of pregnancy, if this applies.  RISKS AND COMPLICATIONS  Infection.   Inflammation of the eyeball (endophthalmitis) that can spread to both eyes (sympathetic ophthalmia).   Poor wound healing.   If an IOL is inserted, it can later fall out of proper position. This is very uncommon.   Clouding of the part of your eye that holds an IOL in place. This is called an "after-cataract." These are uncommon, but easily treated.  BEFORE THE PROCEDURE  Do not eat or drink anything except small amounts of  water for 8 to 12 before your surgery, or as directed by your caregiver.   Unless you are told otherwise, continue any eyedrops you have been prescribed.   Talk to your primary caregiver about all other medicines that you take (both prescription and non-prescription). In some cases, you may need to stop or change medicines near the time of your surgery. This is most important if you are taking blood-thinning medicine.Do not stop medicines unless you are told to do so.   Arrange for someone to drive you to and from the procedure.   Do not put contact lenses in either eye on the day of your surgery.  PROCEDURE There is more than one method for safely removing a cataract. Your doctor can explain the differences and help determine which is best for you. Phacoemulsification surgery is the most common form of cataract surgery.  An injection is given behind the eye or eyedrops are given to make this a painless procedure.   A small cut (incision) is made on the edge of the clear, dome-shaped surface that covers the front of the eye (cornea).   A tiny probe is painlessly inserted into the eye. This device gives off ultrasound waves that soften and break up the cloudy center of the lens. This makes it easier for the cloudy lens to be  removed by suction.   An IOL may be implanted.   The normal lens of the eye is covered by a clear capsule. Part of that capsule is intentionally left in the eye to support the IOL.   Your surgeon may or may not use stitches to close the incision.  There are other forms of cataract surgery that require a larger incision and stiches to close the eye. This approach is taken in cases where the doctor feels that the cataract cannot be easily removed using phacoemulsification. AFTER THE PROCEDURE  When an IOL is implanted, it does not need care. It becomes a permanent part of your eye and cannot be seen or felt.   Your doctor will schedule follow-up exams to check on your  progress.   Review your other medicines with your doctor to see which can be resumed after surgery.   Use eyedrops or take medicine as prescribed by your doctor.  Document Released: 12/17/2010 Document Reviewed: 12/14/2010 [facility] Patient Information 2012 [place].  .Cataract Surgery Care After Refer to this sheet in the next few weeks. These instructions provide you with information on caring for yourself after your procedure. Your caregiver may also give you more specific instructions. Your treatment has been planned according to current medical practices, but problems sometimes occur. Call your caregiver if you have any problems or questions after your procedure.  HOME CARE INSTRUCTIONS   Avoid strenuous activities as directed by your caregiver.   Ask your caregiver when you can resume driving.   Use eyedrops or other medicines to help healing and control pressure inside your eye as directed by your caregiver.   Only take over-the-counter or prescription medicines for pain, discomfort, or fever as directed by your caregiver.   Do not to touch or rub your eyes.   You may be instructed to use a protective shield during the first few days and nights after surgery. If not, wear sunglasses to protect your eyes. This is to protect the eye from pressure or from being accidentally bumped.   Keep the area around your eye clean and dry. Avoid swimming or allowing water to hit you directly in the face while showering. Keep soap and shampoo out of your eyes.   Do not bend or lift heavy objects. Bending increases pressure in the eye. You can walk, climb stairs, and do light household chores.   Do not put a contact lens into the eye that had surgery until your caregiver says it is okay to do so.   Ask your doctor when you can return to work. This will depend on the kind of work that you do. If you work in a dusty environment, you may be advised to wear protective eyewear for a period of  time.   Ask your caregiver when it will be safe to engage in sexual activity.   Continue with your regular eye exams as directed by your caregiver.  What to expect:  It is normal to feel itching and mild discomfort for a few days after cataract surgery. Some fluid discharge is also common, and your eye may be sensitive to light and touch.   After 1 to 2 days, even moderate discomfort should disappear. In most cases, healing will take about 6 weeks.   If you received an intraocular lens (IOL), you may notice that colors are very bright or have a blue tinge. Also, if you have been in bright sunlight, everything may appear reddish for a few hours.  If you see these color tinges, it is because your lens is clear and no longer cloudy. Within a few months after receiving an IOL, these extra colors should go away. When you have healed, you will probably need new glasses.  SEEK MEDICAL CARE IF:   You have increased bruising around your eye.   You have discomfort not helped by medicine.  SEEK IMMEDIATE MEDICAL CARE IF:   You have a fever.   You have a worsening or sudden vision loss.   You have redness, swelling, or increasing pain in the eye.   You have a thick discharge from the eye that had surgery.  MAKE SURE YOU:  Understand these instructions.   Will watch your condition.   Will get help right away if you are not doing well or get worse.  Document Released: 07/17/2004 Document Revised: 12/17/2010 Document Reviewed: 08/21/2010 [facility] Patient Information 2012 [place].    Monitored Anesthesia Care  Monitored anesthesia care is an anesthesia service for a medical procedure. Anesthesia is the loss of the ability to feel pain. It is produced by medications called anesthetics. It may affect a small area of your body (local anesthesia), a large area of your body (regional anesthesia), or your entire body (general anesthesia). The need for monitored anesthesia care depends your  procedure, your condition, and the potential need for regional or general anesthesia. It is often provided during procedures where:   General anesthesia may be needed if there are complications. This is because you need special care when you are under general anesthesia.   You will be under local or regional anesthesia. This is so that you are able to have higher levels of anesthesia if needed.   You will receive calming medications (sedatives). This is especially the case if sedatives are given to put you in a semi-conscious state of relaxation (deep sedation). This is because the amount of sedative needed to produce this state can be hard to predict. Too much of a sedative can produce general anesthesia. Monitored anesthesia care is performed by one or more caregivers who have special training in all types of anesthesia. You will need to meet with these caregivers before your procedure. During this meeting, they will ask you about your medical history. They will also give you instructions to follow. (For example, you will need to stop eating and drinking before your procedure. You may also need to stop or change medications you are taking.) During your procedure, your caregivers will stay with you. They will:   Watch your condition. This includes watching you blood pressure, breathing, and level of pain.   Diagnose and treat problems that occur.   Give medications if they are needed. These may include calming medications (sedatives) and anesthetics.   Make sure you are comfortable.  Having monitored anesthesia care does not necessarily mean that you will be under anesthesia. It does mean that your caregivers will be able to manage anesthesia if you need it or if it occurs. It also means that you will be able to have a different type of anesthesia than you are having if you need it. When your procedure is complete, your caregivers will continue to watch your condition. They will make sure any  medications wear off before you are allowed to go home.  Document Released: 09/23/2004 Document Revised: 04/24/2012 Document Reviewed: 02/09/2012 [facility] Patient Information 2014 [place], [place].

## 2015-02-18 ENCOUNTER — Encounter (HOSPITAL_COMMUNITY)
Admission: RE | Admit: 2015-02-18 | Discharge: 2015-02-18 | Disposition: A | Discharge status: Home / Self Care | Payer: Medicare HMO | Source: Ambulatory Visit | Attending: Ophthalmology | Admitting: Ophthalmology

## 2015-02-18 ENCOUNTER — Encounter (HOSPITAL_COMMUNITY): Payer: Self-pay

## 2015-02-18 ENCOUNTER — Other Ambulatory Visit: Payer: Self-pay

## 2015-02-18 DIAGNOSIS — Z01812 Encounter for preprocedural laboratory examination: Secondary | ICD-10-CM | POA: Insufficient documentation

## 2015-02-18 DIAGNOSIS — Z01810 Encounter for preprocedural cardiovascular examination: Secondary | ICD-10-CM | POA: Diagnosis not present

## 2015-02-21 MED ORDER — PHENYLEPHRINE HCL 2.5 % OP SOLN
OPHTHALMIC | Status: AC
  Filled 2015-02-21: qty 15

## 2015-02-21 MED ORDER — CYCLOPENTOLATE-PHENYLEPHRINE OP SOLN OPTIME - NO CHARGE
OPHTHALMIC | Status: AC
  Filled 2015-02-21: qty 2

## 2015-02-21 MED ORDER — TETRACAINE HCL 0.5 % OP SOLN
OPHTHALMIC | Status: AC
  Filled 2015-02-21: qty 4

## 2015-02-21 MED ORDER — LIDOCAINE HCL 3.5 % OP GEL
OPHTHALMIC | Status: AC
  Filled 2015-02-21: qty 1

## 2015-02-21 MED ORDER — LIDOCAINE HCL (PF) 1 % IJ SOLN
INTRAMUSCULAR | Status: AC
  Filled 2015-02-21: qty 2

## 2015-02-21 MED ORDER — NEOMYCIN-POLYMYXIN-DEXAMETH 3.5-10000-0.1 OP SUSP
OPHTHALMIC | Status: AC
  Filled 2015-02-21: qty 5

## 2015-02-24 ENCOUNTER — Ambulatory Visit (HOSPITAL_COMMUNITY): Payer: Medicare HMO | Admitting: Anesthesiology

## 2015-02-24 ENCOUNTER — Encounter (HOSPITAL_COMMUNITY): Payer: Self-pay | Admitting: *Deleted

## 2015-02-24 ENCOUNTER — Ambulatory Visit (HOSPITAL_COMMUNITY)
Admission: RE | Admit: 2015-02-24 | Discharge: 2015-02-24 | Disposition: A | Discharge status: Home / Self Care | Payer: Medicare HMO | Source: Ambulatory Visit | Attending: Ophthalmology | Admitting: Ophthalmology

## 2015-02-24 ENCOUNTER — Encounter (HOSPITAL_COMMUNITY)
Admission: RE | Disposition: A | Discharge status: Home / Self Care | Payer: Self-pay | Source: Ambulatory Visit | Attending: Ophthalmology

## 2015-02-24 DIAGNOSIS — Z79899 Other long term (current) drug therapy: Secondary | ICD-10-CM | POA: Insufficient documentation

## 2015-02-24 DIAGNOSIS — H269 Unspecified cataract: Secondary | ICD-10-CM | POA: Diagnosis not present

## 2015-02-24 DIAGNOSIS — K219 Gastro-esophageal reflux disease without esophagitis: Secondary | ICD-10-CM | POA: Diagnosis not present

## 2015-02-24 DIAGNOSIS — H2511 Age-related nuclear cataract, right eye: Secondary | ICD-10-CM | POA: Diagnosis not present

## 2015-02-24 HISTORY — PX: CATARACT EXTRACTION W/PHACO: SHX586

## 2015-02-24 SURGERY — PHACOEMULSIFICATION, CATARACT, WITH IOL INSERTION
Anesthesia: Monitor Anesthesia Care | Site: Eye | Laterality: Right

## 2015-02-24 MED ORDER — MIDAZOLAM HCL 2 MG/2ML IJ SOLN
INTRAMUSCULAR | Status: AC
  Filled 2015-02-24: qty 2

## 2015-02-24 MED ORDER — NEOMYCIN-POLYMYXIN-DEXAMETH 3.5-10000-0.1 OP SUSP
OPHTHALMIC | Status: DC | PRN
  Administered 2015-02-24: 2 [drp] via OPHTHALMIC

## 2015-02-24 MED ORDER — EPINEPHRINE HCL 1 MG/ML IJ SOLN
INTRAMUSCULAR | Status: AC
  Filled 2015-02-24: qty 1

## 2015-02-24 MED ORDER — BSS IO SOLN
INTRAOCULAR | Status: DC | PRN
  Administered 2015-02-24: 15 mL

## 2015-02-24 MED ORDER — TETRACAINE HCL 0.5 % OP SOLN
1.0000 [drp] | OPHTHALMIC | Status: AC
  Administered 2015-02-24 (×3): 1 [drp] via OPHTHALMIC

## 2015-02-24 MED ORDER — PHENYLEPHRINE HCL 2.5 % OP SOLN
1.0000 [drp] | OPHTHALMIC | Status: AC
  Administered 2015-02-24 (×3): 1 [drp] via OPHTHALMIC

## 2015-02-24 MED ORDER — LIDOCAINE HCL 3.5 % OP GEL
1.0000 "application " | Freq: Once | OPHTHALMIC | Status: AC
  Administered 2015-02-24: 1 via OPHTHALMIC

## 2015-02-24 MED ORDER — EPINEPHRINE HCL 1 MG/ML IJ SOLN
INTRAOCULAR | Status: DC | PRN
  Administered 2015-02-24: 500 mL

## 2015-02-24 MED ORDER — CYCLOPENTOLATE-PHENYLEPHRINE 0.2-1 % OP SOLN
1.0000 [drp] | OPHTHALMIC | Status: AC
  Administered 2015-02-24 (×3): 1 [drp] via OPHTHALMIC

## 2015-02-24 MED ORDER — LACTATED RINGERS IV SOLN
INTRAVENOUS | Status: DC
  Administered 2015-02-24: 07:00:00 via INTRAVENOUS

## 2015-02-24 MED ORDER — FENTANYL CITRATE (PF) 100 MCG/2ML IJ SOLN: 25.0000 ug | INTRAMUSCULAR | Status: DC | PRN

## 2015-02-24 MED ORDER — POVIDONE-IODINE 5 % OP SOLN
OPHTHALMIC | Status: DC | PRN
  Administered 2015-02-24: 1 via OPHTHALMIC

## 2015-02-24 MED ORDER — PROVISC 10 MG/ML IO SOLN
INTRAOCULAR | Status: DC | PRN
  Administered 2015-02-24: 0.85 mL via INTRAOCULAR

## 2015-02-24 MED ORDER — ONDANSETRON HCL 4 MG/2ML IJ SOLN: 4.0000 mg | Freq: Once | INTRAMUSCULAR | Status: DC | PRN

## 2015-02-24 MED ORDER — MIDAZOLAM HCL 2 MG/2ML IJ SOLN
1.0000 mg | INTRAMUSCULAR | Status: DC | PRN
  Administered 2015-02-24: 2 mg via INTRAVENOUS

## 2015-02-24 MED ORDER — LIDOCAINE HCL (PF) 1 % IJ SOLN
INTRAMUSCULAR | Status: DC | PRN
  Administered 2015-02-24: .5 mL

## 2015-02-24 SURGICAL SUPPLY — 12 items
CLOTH BEACON ORANGE TIMEOUT ST (SAFETY) ×1 IMPLANT
EYE SHIELD UNIVERSAL CLEAR (GAUZE/BANDAGES/DRESSINGS) ×1 IMPLANT
GLOVE BIOGEL PI IND STRL 6.5 (GLOVE) IMPLANT
GLOVE BIOGEL PI IND STRL 7.0 (GLOVE) IMPLANT
GLOVE BIOGEL PI INDICATOR 6.5 (GLOVE) ×1
GLOVE BIOGEL PI INDICATOR 7.0 (GLOVE) ×1
PAD ARMBOARD 7.5X6 YLW CONV (MISCELLANEOUS) ×1 IMPLANT
SIGHTPATH CAT PROC W REG LENS (Ophthalmic Related) ×2 IMPLANT
SYRINGE LUER LOK 1CC (MISCELLANEOUS) ×1 IMPLANT
TAPE SURG TRANSPORE 1 IN (GAUZE/BANDAGES/DRESSINGS) IMPLANT
TAPE SURGICAL TRANSPORE 1 IN (GAUZE/BANDAGES/DRESSINGS) ×1
WATER STERILE IRR 250ML POUR (IV SOLUTION) ×1 IMPLANT

## 2015-02-24 NOTE — Discharge Instructions (Signed)

## 2015-02-24 NOTE — Transfer of Care (Signed)
Immediate Anesthesia Transfer of Care Note  Patient: Tanya Swanson  Procedure(s) Performed: Procedure(s): CATARACT EXTRACTION PHACO AND INTRAOCULAR LENS PLACEMENT RIGHT EYE CDE=8.01 (Right)  Patient Location: Short Stay  Anesthesia Type:MAC  Level of Consciousness: awake, alert , oriented and patient cooperative  Airway & Oxygen Therapy: Patient Spontanous Breathing  Post-op Assessment: Report given to RN and Post -op Vital signs reviewed and stable  Post vital signs: Reviewed and stable  Last Vitals:  Filed Vitals:   02/24/15 0715 02/24/15 0720  BP: 104/59 96/63  Pulse:    Temp:    Resp: 16 17    Complications: No apparent anesthesia complications

## 2015-02-24 NOTE — Anesthesia Procedure Notes (Signed)
Procedure Name: MAC Date/Time: 02/24/2015 7:26 AM Performed by: Andree Elk, AMY A Pre-anesthesia Checklist: Patient identified, Timeout performed, Emergency Drugs available, Suction available and Patient being monitored Patient Re-evaluated:Patient Re-evaluated prior to inductionOxygen Delivery Method: Nasal cannula

## 2015-02-24 NOTE — Anesthesia Postprocedure Evaluation (Signed)
Anesthesia Post Note  Patient: Tanya Swanson  Procedure(s) Performed: Procedure(s) (LRB): CATARACT EXTRACTION PHACO AND INTRAOCULAR LENS PLACEMENT RIGHT EYE CDE=8.01 (Right)  Patient location during evaluation: Short Stay Anesthesia Type: MAC Level of consciousness: awake and alert and oriented Pain management: pain level controlled Vital Signs Assessment: post-procedure vital signs reviewed and stable Respiratory status: spontaneous breathing Cardiovascular status: stable Postop Assessment: no signs of nausea or vomiting Anesthetic complications: no    Last Vitals:  Filed Vitals:   02/24/15 0715 02/24/15 0720  BP: 104/59 96/63  Pulse:    Temp:    Resp: 16 17    Last Pain: There were no vitals filed for this visit.               ADAMS, AMY A

## 2015-02-24 NOTE — Op Note (Signed)
Date of Admission: 02/24/2015  Date of Surgery: 02/24/2015   Pre-Op Dx: Cataract Right Eye  Post-Op Dx: Senile Nuclear Cataract Right  Eye,  Dx Code H25.11  Surgeon: Tonny Branch, M.D.  Assistants: None  Anesthesia: Topical with MAC  Indications: Painless, progressive loss of vision with compromise of daily activities.  Surgery: Cataract Extraction with Intraocular lens Implant Right Eye  Discription: The patient had dilating drops and viscous lidocaine placed into the Right eye in the pre-op holding area. After transfer to the operating room, a time out was performed. The patient was then prepped and draped. Beginning with a 72 degree blade a paracentesis port was made at the surgeon's 2 o'clock position. The anterior chamber was then filled with 1% non-preserved lidocaine. This was followed by filling the anterior chamber with Provisc.  A 2.30mm keratome blade was used to make a clear corneal incision at the temporal limbus.  A bent cystatome needle was used to create a continuous tear capsulotomy. Hydrodissection was performed with balanced salt solution on a Fine canula. The lens nucleus was then removed using the phacoemulsification handpiece. Residual cortex was removed with the I&A handpiece. The anterior chamber and capsular bag were refilled with Provisc. A posterior chamber intraocular lens was placed into the capsular bag with it's injector. The implant was positioned with the Kuglan hook. The Provisc was then removed from the anterior chamber and capsular bag with the I&A handpiece. Stromal hydration of the main incision and paracentesis port was performed with BSS on a Fine canula. The wounds were tested for leak which was negative. The patient tolerated the procedure well. There were no operative complications. The patient was then transferred to the recovery room in stable condition.  Complications: None  Specimen: None  EBL: None  Prosthetic device: Hoya iSert 250, power 19.5 D,  SN [id number].

## 2015-02-24 NOTE — Anesthesia Preprocedure Evaluation (Signed)
Anesthesia Evaluation  Patient identified by MRN, date of birth, ID band Patient awake    Reviewed: Allergy & Precautions, NPO status   Airway Mallampati: II  TM Distance: >3 FB     Dental  (+) Dental Advisory Given,    Pulmonary pneumonia, resolved,    Pulmonary exam normal        Cardiovascular Normal cardiovascular exam     Neuro/Psych  Neuromuscular disease    GI/Hepatic hiatal hernia, GERD  Medicated and Controlled,  Endo/Other    Renal/GU      Musculoskeletal   Abdominal Normal abdominal exam  (+)   Peds  Hematology   Anesthesia Other Findings   Reproductive/Obstetrics                             Anesthesia Physical Anesthesia Plan  ASA: II  Anesthesia Plan: MAC   Post-op Pain Management:    Induction: Intravenous  Airway Management Planned: Nasal Cannula  Additional Equipment:   Intra-op Plan:   Post-operative Plan:   Informed Consent: I have reviewed the patients History and Physical, chart, labs and discussed the procedure including the risks, benefits and alternatives for the proposed anesthesia with the patient or authorized representative who has indicated his/her understanding and acceptance.   Dental advisory given  Plan Discussed with: CRNA  Anesthesia Plan Comments:         Anesthesia Quick Evaluation

## 2015-02-24 NOTE — H&P (Signed)
I have reviewed the H&P, the patient was re-examined, and I have identified no interval changes in medical condition and plan of care since the history and physical of record  

## 2015-02-25 ENCOUNTER — Encounter (HOSPITAL_COMMUNITY): Payer: Self-pay | Admitting: Ophthalmology

## 2015-03-17 DIAGNOSIS — H2512 Age-related nuclear cataract, left eye: Secondary | ICD-10-CM | POA: Diagnosis not present

## 2015-03-17 DIAGNOSIS — Z961 Presence of intraocular lens: Secondary | ICD-10-CM | POA: Diagnosis not present

## 2015-03-17 DIAGNOSIS — H5202 Hypermetropia, left eye: Secondary | ICD-10-CM | POA: Diagnosis not present

## 2015-03-17 DIAGNOSIS — H5231 Anisometropia: Secondary | ICD-10-CM | POA: Diagnosis not present

## 2015-03-28 ENCOUNTER — Encounter (HOSPITAL_COMMUNITY): Payer: Self-pay

## 2015-03-28 ENCOUNTER — Encounter (HOSPITAL_COMMUNITY)
Admission: RE | Admit: 2015-03-28 | Discharge: 2015-03-28 | Disposition: A | Discharge status: Home / Self Care | Payer: Medicare HMO | Source: Ambulatory Visit | Attending: Ophthalmology | Admitting: Ophthalmology

## 2015-04-02 ENCOUNTER — Other Ambulatory Visit: Payer: Self-pay

## 2015-04-02 DIAGNOSIS — Z1231 Encounter for screening mammogram for malignant neoplasm of breast: Secondary | ICD-10-CM

## 2015-04-03 ENCOUNTER — Encounter (HOSPITAL_COMMUNITY)
Admission: RE | Disposition: A | Discharge status: Home / Self Care | Payer: Self-pay | Source: Ambulatory Visit | Attending: Ophthalmology

## 2015-04-03 ENCOUNTER — Ambulatory Visit (HOSPITAL_COMMUNITY)
Admission: RE | Admit: 2015-04-03 | Discharge: 2015-04-03 | Disposition: A | Discharge status: Home / Self Care | Payer: Medicare HMO | Source: Ambulatory Visit | Attending: Ophthalmology | Admitting: Ophthalmology

## 2015-04-03 ENCOUNTER — Ambulatory Visit (HOSPITAL_COMMUNITY): Payer: Medicare HMO | Admitting: Anesthesiology

## 2015-04-03 ENCOUNTER — Encounter (HOSPITAL_COMMUNITY): Payer: Self-pay | Admitting: *Deleted

## 2015-04-03 DIAGNOSIS — Z79899 Other long term (current) drug therapy: Secondary | ICD-10-CM | POA: Insufficient documentation

## 2015-04-03 DIAGNOSIS — Z7982 Long term (current) use of aspirin: Secondary | ICD-10-CM | POA: Insufficient documentation

## 2015-04-03 DIAGNOSIS — H2512 Age-related nuclear cataract, left eye: Secondary | ICD-10-CM | POA: Diagnosis not present

## 2015-04-03 DIAGNOSIS — I10 Essential (primary) hypertension: Secondary | ICD-10-CM | POA: Insufficient documentation

## 2015-04-03 DIAGNOSIS — H269 Unspecified cataract: Secondary | ICD-10-CM | POA: Diagnosis not present

## 2015-04-03 DIAGNOSIS — K219 Gastro-esophageal reflux disease without esophagitis: Secondary | ICD-10-CM | POA: Diagnosis not present

## 2015-04-03 HISTORY — PX: CATARACT EXTRACTION W/PHACO: SHX586

## 2015-04-03 SURGERY — PHACOEMULSIFICATION, CATARACT, WITH IOL INSERTION
Anesthesia: Monitor Anesthesia Care | Site: Eye | Laterality: Left

## 2015-04-03 MED ORDER — PHENYLEPHRINE HCL 2.5 % OP SOLN
1.0000 [drp] | OPHTHALMIC | Status: AC
  Administered 2015-04-03 (×3): 1 [drp] via OPHTHALMIC

## 2015-04-03 MED ORDER — LACTATED RINGERS IV SOLN
INTRAVENOUS | Status: DC
  Administered 2015-04-03: 10:00:00 via INTRAVENOUS

## 2015-04-03 MED ORDER — POVIDONE-IODINE 5 % OP SOLN
OPHTHALMIC | Status: DC | PRN
  Administered 2015-04-03: 1 via OPHTHALMIC

## 2015-04-03 MED ORDER — MIDAZOLAM HCL 2 MG/2ML IJ SOLN
INTRAMUSCULAR | Status: AC
  Filled 2015-04-03: qty 2

## 2015-04-03 MED ORDER — EPINEPHRINE HCL 1 MG/ML IJ SOLN
INTRAMUSCULAR | Status: AC
Start: 2015-04-03 — End: 2015-04-03
  Filled 2015-04-03: qty 1

## 2015-04-03 MED ORDER — EPINEPHRINE HCL 1 MG/ML IJ SOLN
INTRAMUSCULAR | Status: DC | PRN
  Administered 2015-04-03: 500 mL

## 2015-04-03 MED ORDER — BSS IO SOLN
INTRAOCULAR | Status: DC | PRN
  Administered 2015-04-03: 15 mL

## 2015-04-03 MED ORDER — LIDOCAINE HCL 3.5 % OP GEL
1.0000 "application " | Freq: Once | OPHTHALMIC | Status: AC
  Administered 2015-04-03: 1 via OPHTHALMIC

## 2015-04-03 MED ORDER — NEOMYCIN-POLYMYXIN-DEXAMETH 3.5-10000-0.1 OP SUSP
OPHTHALMIC | Status: DC | PRN
  Administered 2015-04-03: 2 [drp] via OPHTHALMIC

## 2015-04-03 MED ORDER — LIDOCAINE HCL (PF) 1 % IJ SOLN
INTRAMUSCULAR | Status: DC | PRN
  Administered 2015-04-03: .5 mL

## 2015-04-03 MED ORDER — FENTANYL CITRATE (PF) 100 MCG/2ML IJ SOLN
25.0000 ug | INTRAMUSCULAR | Status: AC
  Administered 2015-04-03 (×2): 25 ug via INTRAVENOUS

## 2015-04-03 MED ORDER — FENTANYL CITRATE (PF) 100 MCG/2ML IJ SOLN
INTRAMUSCULAR | Status: AC
  Filled 2015-04-03: qty 2

## 2015-04-03 MED ORDER — LIDOCAINE 3.5 % OP GEL OPTIME - NO CHARGE
OPHTHALMIC | Status: DC | PRN
  Administered 2015-04-03: 1 [drp] via OPHTHALMIC

## 2015-04-03 MED ORDER — PROVISC 10 MG/ML IO SOLN
INTRAOCULAR | Status: DC | PRN
  Administered 2015-04-03: 0.85 mL via INTRAOCULAR

## 2015-04-03 MED ORDER — MIDAZOLAM HCL 2 MG/2ML IJ SOLN
1.0000 mg | INTRAMUSCULAR | Status: DC | PRN
  Administered 2015-04-03: 2 mg via INTRAVENOUS

## 2015-04-03 MED ORDER — TETRACAINE HCL 0.5 % OP SOLN
1.0000 [drp] | OPHTHALMIC | Status: AC
  Administered 2015-04-03 (×3): 1 [drp] via OPHTHALMIC

## 2015-04-03 MED ORDER — CYCLOPENTOLATE-PHENYLEPHRINE 0.2-1 % OP SOLN
1.0000 [drp] | OPHTHALMIC | Status: AC
Start: 2015-04-03 — End: 2015-04-03
  Administered 2015-04-03 (×3): 1 [drp] via OPHTHALMIC

## 2015-04-03 SURGICAL SUPPLY — 12 items
CLOTH BEACON ORANGE TIMEOUT ST (SAFETY) ×1 IMPLANT
EYE SHIELD UNIVERSAL CLEAR (GAUZE/BANDAGES/DRESSINGS) ×1 IMPLANT
GLOVE BIOGEL PI IND STRL 7.0 (GLOVE) IMPLANT
GLOVE BIOGEL PI INDICATOR 7.0 (GLOVE) ×2
GLOVE EXAM NITRILE MD LF STRL (GLOVE) ×1 IMPLANT
GOWN STRL REUS W/TWL LRG LVL3 (GOWN DISPOSABLE) ×1 IMPLANT
PAD ARMBOARD 7.5X6 YLW CONV (MISCELLANEOUS) ×1 IMPLANT
SIGHTPATH CAT PROC W REG LENS (Ophthalmic Related) ×2 IMPLANT
SYRINGE LUER LOK 1CC (MISCELLANEOUS) ×1 IMPLANT
TAPE SURG TRANSPORE 1 IN (GAUZE/BANDAGES/DRESSINGS) IMPLANT
TAPE SURGICAL TRANSPORE 1 IN (GAUZE/BANDAGES/DRESSINGS) ×1
WATER STERILE IRR 250ML POUR (IV SOLUTION) ×1 IMPLANT

## 2015-04-03 NOTE — Op Note (Signed)
Date of Admission: 04/03/2015  Date of Surgery: 04/03/2015   Pre-Op Dx: Cataract Left Eye  Post-Op Dx: Senile Nuclear Cataract Left  Eye,  Dx Code H25.12  Surgeon: Tonny Branch, M.D.  Assistants: None  Anesthesia: Topical with MAC  Indications: Painless, progressive loss of vision with compromise of daily activities.  Surgery: Cataract Extraction with Intraocular lens Implant Left Eye  Discription: The patient had dilating drops and viscous lidocaine placed into the Left eye in the pre-op holding area. After transfer to the operating room, a time out was performed. The patient was then prepped and draped. Beginning with a 36 degree blade a paracentesis port was made at the surgeon's 2 o'clock position. The anterior chamber was then filled with 1% non-preserved lidocaine. This was followed by filling the anterior chamber with Provisc.  A 2.28mm keratome blade was used to make a clear corneal incision at the temporal limbus.  A bent cystatome needle was used to create a continuous tear capsulotomy. Hydrodissection was performed with balanced salt solution on a Fine canula. The lens nucleus was then removed using the phacoemulsification handpiece. Residual cortex was removed with the I&A handpiece. The anterior chamber and capsular bag were refilled with Provisc. A posterior chamber intraocular lens was placed into the capsular bag with it's injector. The implant was positioned with the Kuglan hook. The Provisc was then removed from the anterior chamber and capsular bag with the I&A handpiece. Stromal hydration of the main incision and paracentesis port was performed with BSS on a Fine canula. The wounds were tested for leak which was negative. The patient tolerated the procedure well. There were no operative complications. The patient was then transferred to the recovery room in stable condition.  Complications: None  Specimen: None  EBL: None  Prosthetic device: Hoya iSert 250, power 19.5 D, SN  [id number].

## 2015-04-03 NOTE — H&P (Signed)
I have reviewed the H&P, the patient was re-examined, and I have identified no interval changes in medical condition and plan of care since the history and physical of record  

## 2015-04-03 NOTE — Anesthesia Preprocedure Evaluation (Signed)
Anesthesia Evaluation  Patient identified by MRN, date of birth, ID band Patient awake    Reviewed: Allergy & Precautions, NPO status   Airway Mallampati: II  TM Distance: >3 FB     Dental  (+) Dental Advisory Given,    Pulmonary pneumonia, resolved,    Pulmonary exam normal        Cardiovascular Normal cardiovascular exam     Neuro/Psych  Neuromuscular disease    GI/Hepatic hiatal hernia, GERD  Medicated and Controlled,  Endo/Other    Renal/GU      Musculoskeletal   Abdominal Normal abdominal exam  (+)   Peds  Hematology   Anesthesia Other Findings   Reproductive/Obstetrics                             Anesthesia Physical Anesthesia Plan  ASA: II  Anesthesia Plan: MAC   Post-op Pain Management:    Induction: Intravenous  Airway Management Planned: Nasal Cannula  Additional Equipment:   Intra-op Plan:   Post-operative Plan:   Informed Consent: I have reviewed the patients History and Physical, chart, labs and discussed the procedure including the risks, benefits and alternatives for the proposed anesthesia with the patient or authorized representative who has indicated his/her understanding and acceptance.   Dental advisory given  Plan Discussed with: CRNA  Anesthesia Plan Comments:         Anesthesia Quick Evaluation

## 2015-04-03 NOTE — Discharge Instructions (Signed)
Cataract Surgery, Care After °Refer to this sheet in the next few weeks. These instructions provide you with information on caring for yourself after your procedure. Your caregiver may also give you more specific instructions. Your treatment has been planned according to current medical practices, but problems sometimes occur. Call your caregiver if you have any problems or questions after your procedure.  °HOME CARE INSTRUCTIONS  °· Avoid strenuous activities as directed by your caregiver. °· Ask your caregiver when you can resume driving. °· Use eyedrops or other medicines to help healing and control pressure inside your eye as directed by your caregiver. °· Only take over-the-counter or prescription medicines for pain, discomfort, or fever as directed by your caregiver. °· Do not to touch or rub your eyes. °· You may be instructed to use a protective shield during the first few days and nights after surgery. If not, wear sunglasses to protect your eyes. This is to protect the eye from pressure or from being accidentally bumped. °· Keep the area around your eye clean and dry. Avoid swimming or allowing water to hit you directly in the face while showering. Keep soap and shampoo out of your eyes. °· Do not bend or lift heavy objects. Bending increases pressure in the eye. You can walk, climb stairs, and do light household chores. °· Do not put a contact lens into the eye that had surgery until your caregiver says it is okay to do so. °· Ask your doctor when you can return to work. This will depend on the kind of work that you do. If you work in a dusty environment, you may be advised to wear protective eyewear for a period of time. °· Ask your caregiver when it will be safe to engage in sexual activity. °· Continue with your regular eye exams as directed by your caregiver. °What to expect: °· It is normal to feel itching and mild discomfort for a few days after cataract surgery. Some fluid discharge is also common,  and your eye may be sensitive to light and touch. °· After 1 to 2 days, even moderate discomfort should disappear. In most cases, healing will take about 6 weeks. °· If you received an intraocular lens (IOL), you may notice that colors are very bright or have a blue tinge. Also, if you have been in bright sunlight, everything may appear reddish for a few hours. If you see these color tinges, it is because your lens is clear and no longer cloudy. Within a few months after receiving an IOL, these extra colors should go away. When you have healed, you will probably need new glasses. °SEEK MEDICAL CARE IF:  °· You have increased bruising around your eye. °· You have discomfort not helped by medicine. °SEEK IMMEDIATE MEDICAL CARE IF:  °· You have a  fever. °· You have a worsening or sudden vision loss. °· You have redness, swelling, or increasing pain in the eye. °· You have a thick discharge from the eye that had surgery. °MAKE SURE YOU: °· Understand these instructions. °· Will watch your condition. °· Will get help right away if you are not doing well or get worse. °  °This information is not intended to replace advice given to you by your health care provider. Make sure you discuss any questions you have with your health care provider. °  °Document Released: 07/17/2004 Document Revised: 01/18/2014 Document Reviewed: 08/21/2010 °Elsevier Interactive Patient Education ©2016 [employer] ° °

## 2015-04-03 NOTE — Transfer of Care (Signed)
Immediate Anesthesia Transfer of Care Note  Patient: Tanya Swanson  Procedure(s) Performed: Procedure(s) with comments: CATARACT EXTRACTION PHACO AND INTRAOCULAR LENS PLACEMENT (IOC) (Left) - CDE:11.74  Patient Location: Short Stay  Anesthesia Type:MAC  Level of Consciousness: awake, alert , oriented and patient cooperative  Airway & Oxygen Therapy: Patient Spontanous Breathing  Post-op Assessment: Report given to RN, Post -op Vital signs reviewed and stable and Patient moving all extremities  Post vital signs: Reviewed and stable  Last Vitals:  Filed Vitals:   04/03/15 1000 04/03/15 1005  BP: 106/66 100/64  Resp: 18 18    Complications: No apparent anesthesia complications

## 2015-04-03 NOTE — Anesthesia Postprocedure Evaluation (Signed)
Anesthesia Post Note  Patient: Tanya Swanson  Procedure(s) Performed: Procedure(s) (LRB): CATARACT EXTRACTION PHACO AND INTRAOCULAR LENS PLACEMENT (IOC) (Left)  Patient location during evaluation: Short Stay Anesthesia Type: MAC Level of consciousness: awake and alert, oriented and patient cooperative Pain management: pain level controlled Respiratory status: spontaneous breathing and nonlabored ventilation Cardiovascular status: stable Postop Assessment: no signs of nausea or vomiting Anesthetic complications: no    Last Vitals:  Filed Vitals:   04/03/15 1000 04/03/15 1005  BP: 106/66 100/64  Resp: 18 18    Last Pain: There were no vitals filed for this visit.               Haruto Demaria J

## 2015-04-04 ENCOUNTER — Encounter (HOSPITAL_COMMUNITY): Payer: Self-pay | Admitting: Ophthalmology

## 2015-04-09 DIAGNOSIS — Z1211 Encounter for screening for malignant neoplasm of colon: Secondary | ICD-10-CM | POA: Diagnosis not present

## 2015-04-09 DIAGNOSIS — Z0000 Encounter for general adult medical examination without abnormal findings: Secondary | ICD-10-CM | POA: Diagnosis not present

## 2015-04-20 DIAGNOSIS — H60502 Unspecified acute noninfective otitis externa, left ear: Secondary | ICD-10-CM | POA: Diagnosis not present

## 2015-04-29 ENCOUNTER — Ambulatory Visit
Admission: RE | Admit: 2015-04-29 | Discharge: 2015-04-29 | Disposition: A | Discharge status: Home / Self Care | Payer: Medicare HMO | Source: Ambulatory Visit

## 2015-04-29 DIAGNOSIS — Z1231 Encounter for screening mammogram for malignant neoplasm of breast: Secondary | ICD-10-CM

## 2015-05-05 DIAGNOSIS — K5901 Slow transit constipation: Secondary | ICD-10-CM | POA: Diagnosis not present

## 2015-05-05 DIAGNOSIS — R1310 Dysphagia, unspecified: Secondary | ICD-10-CM | POA: Diagnosis not present

## 2015-05-05 DIAGNOSIS — K219 Gastro-esophageal reflux disease without esophagitis: Secondary | ICD-10-CM | POA: Diagnosis not present

## 2015-05-05 DIAGNOSIS — K921 Melena: Secondary | ICD-10-CM | POA: Diagnosis not present

## 2015-05-12 DIAGNOSIS — K921 Melena: Secondary | ICD-10-CM | POA: Diagnosis not present

## 2015-08-26 DIAGNOSIS — M7541 Impingement syndrome of right shoulder: Secondary | ICD-10-CM | POA: Diagnosis not present

## 2015-08-27 DIAGNOSIS — K219 Gastro-esophageal reflux disease without esophagitis: Secondary | ICD-10-CM | POA: Diagnosis not present

## 2015-08-27 DIAGNOSIS — K921 Melena: Secondary | ICD-10-CM | POA: Diagnosis not present

## 2015-08-27 DIAGNOSIS — K5901 Slow transit constipation: Secondary | ICD-10-CM | POA: Diagnosis not present

## 2015-09-09 DIAGNOSIS — K5901 Slow transit constipation: Secondary | ICD-10-CM | POA: Diagnosis not present

## 2015-09-09 DIAGNOSIS — K921 Melena: Secondary | ICD-10-CM | POA: Diagnosis not present

## 2015-09-16 DIAGNOSIS — D72829 Elevated white blood cell count, unspecified: Secondary | ICD-10-CM | POA: Diagnosis not present

## 2015-09-16 LAB — CBC AND DIFFERENTIAL
HEMATOCRIT: 37 (ref 36–46)
Hemoglobin: 12.7 (ref 12.0–16.0)
NEUTROS ABS: 4
Platelets: 261 (ref 150–399)
WBC: 6.7

## 2015-09-16 LAB — FECAL OCCULT BLOOD, GUAIAC: FECAL OCCULT BLD: POSITIVE

## 2015-10-13 DIAGNOSIS — R69 Illness, unspecified: Secondary | ICD-10-CM | POA: Diagnosis not present

## 2015-12-08 DIAGNOSIS — M545 Low back pain: Secondary | ICD-10-CM | POA: Diagnosis not present

## 2015-12-08 DIAGNOSIS — G8929 Other chronic pain: Secondary | ICD-10-CM | POA: Diagnosis not present

## 2015-12-08 DIAGNOSIS — R109 Unspecified abdominal pain: Secondary | ICD-10-CM | POA: Diagnosis not present

## 2015-12-25 DIAGNOSIS — M461 Sacroiliitis, not elsewhere classified: Secondary | ICD-10-CM | POA: Diagnosis not present

## 2016-01-20 DIAGNOSIS — M461 Sacroiliitis, not elsewhere classified: Secondary | ICD-10-CM | POA: Diagnosis not present

## 2016-01-20 DIAGNOSIS — M5136 Other intervertebral disc degeneration, lumbar region: Secondary | ICD-10-CM | POA: Diagnosis not present

## 2016-02-18 DIAGNOSIS — Z599 Problem related to housing and economic circumstances, unspecified: Secondary | ICD-10-CM | POA: Diagnosis not present

## 2016-02-18 DIAGNOSIS — Z6825 Body mass index (BMI) 25.0-25.9, adult: Secondary | ICD-10-CM | POA: Diagnosis not present

## 2016-02-18 DIAGNOSIS — K219 Gastro-esophageal reflux disease without esophagitis: Secondary | ICD-10-CM | POA: Diagnosis not present

## 2016-02-18 DIAGNOSIS — Z7982 Long term (current) use of aspirin: Secondary | ICD-10-CM | POA: Diagnosis not present

## 2016-02-18 DIAGNOSIS — Z0000 Encounter for general adult medical examination without abnormal findings: Secondary | ICD-10-CM | POA: Diagnosis not present

## 2016-03-24 ENCOUNTER — Other Ambulatory Visit: Payer: Self-pay | Admitting: Physician Assistant

## 2016-03-24 DIAGNOSIS — Z1231 Encounter for screening mammogram for malignant neoplasm of breast: Secondary | ICD-10-CM

## 2016-04-09 DIAGNOSIS — Z23 Encounter for immunization: Secondary | ICD-10-CM | POA: Diagnosis not present

## 2016-04-09 DIAGNOSIS — Z0000 Encounter for general adult medical examination without abnormal findings: Secondary | ICD-10-CM | POA: Diagnosis not present

## 2016-04-29 ENCOUNTER — Ambulatory Visit
Admission: RE | Admit: 2016-04-29 | Discharge: 2016-04-29 | Disposition: A | Discharge status: Home / Self Care | Payer: Medicare HMO | Source: Ambulatory Visit | Attending: Physician Assistant | Admitting: Physician Assistant

## 2016-04-29 DIAGNOSIS — Z1231 Encounter for screening mammogram for malignant neoplasm of breast: Secondary | ICD-10-CM | POA: Diagnosis not present

## 2016-06-24 DIAGNOSIS — M5136 Other intervertebral disc degeneration, lumbar region: Secondary | ICD-10-CM | POA: Diagnosis not present

## 2016-06-24 DIAGNOSIS — M461 Sacroiliitis, not elsewhere classified: Secondary | ICD-10-CM | POA: Diagnosis not present

## 2016-07-21 ENCOUNTER — Encounter: Payer: Self-pay | Admitting: Family Medicine

## 2016-07-28 ENCOUNTER — Encounter: Payer: Self-pay | Admitting: Family Medicine

## 2016-07-28 ENCOUNTER — Ambulatory Visit (INDEPENDENT_AMBULATORY_CARE_PROVIDER_SITE_OTHER): Payer: Medicare HMO | Admitting: Family Medicine

## 2016-07-28 VITALS — BP 110/72 | HR 78 | Resp 12 | Ht 67.0 in | Wt 156.2 lb

## 2016-07-28 DIAGNOSIS — M545 Low back pain: Secondary | ICD-10-CM | POA: Diagnosis not present

## 2016-07-28 DIAGNOSIS — R0989 Other specified symptoms and signs involving the circulatory and respiratory systems: Secondary | ICD-10-CM

## 2016-07-28 DIAGNOSIS — G8929 Other chronic pain: Secondary | ICD-10-CM

## 2016-07-28 DIAGNOSIS — R5383 Other fatigue: Secondary | ICD-10-CM

## 2016-07-28 LAB — URINALYSIS, ROUTINE W REFLEX MICROSCOPIC
Bilirubin Urine: NEGATIVE
Hgb urine dipstick: NEGATIVE
KETONES UR: NEGATIVE
Leukocytes, UA: NEGATIVE
Nitrite: NEGATIVE
PH: 8 (ref 5.0–8.0)
RBC / HPF: NONE SEEN (ref 0–?)
SPECIFIC GRAVITY, URINE: 1.02 (ref 1.000–1.030)
TOTAL PROTEIN, URINE-UPE24: NEGATIVE
UROBILINOGEN UA: 0.2 (ref 0.0–1.0)
Urine Glucose: NEGATIVE

## 2016-07-28 LAB — CBC
HCT: 39.1 % (ref 36.0–46.0)
Hemoglobin: 13.1 g/dL (ref 12.0–15.0)
MCHC: 33.6 g/dL (ref 30.0–36.0)
MCV: 90 fl (ref 78.0–100.0)
Platelets: 322 10*3/uL (ref 150.0–400.0)
RBC: 4.34 Mil/uL (ref 3.87–5.11)
RDW: 13.5 % (ref 11.5–15.5)
WBC: 5.6 10*3/uL (ref 4.0–10.5)

## 2016-07-28 LAB — BASIC METABOLIC PANEL
BUN: 11 mg/dL (ref 6–23)
CALCIUM: 9.6 mg/dL (ref 8.4–10.5)
CO2: 28 mEq/L (ref 19–32)
Chloride: 106 mEq/L (ref 96–112)
Creatinine, Ser: 0.82 mg/dL (ref 0.40–1.20)
GFR: 71.76 mL/min (ref >60.00)
GLUCOSE: 92 mg/dL (ref 70–99)
Potassium: 4.5 mEq/L (ref 3.5–5.1)
Sodium: 142 mEq/L (ref 135–145)

## 2016-07-28 LAB — TSH: TSH: 1.08 u[IU]/mL (ref 0.35–4.50)

## 2016-07-28 NOTE — Patient Instructions (Addendum)
A few things to remember from today's visit:   Other fatigue - Plan: Basic metabolic panel, TSH, CBC, Urinalysis, Routine w reflex microscopic  Chronic left-sided low back pain without sciatica - Plan: Basic metabolic panel, Urinalysis, Routine w reflex microscopic  Bruit of right carotid artery   Carotid ltrasound will be arranged.  We have ordered labs or studies at this visit.  It can take up to 1-2 weeks for results and processing. IF results require follow up or explanation, we will call you with instructions. Clinically stable results will be released to your [facility] If you have not heard from [place] or cannot find your results in [facility] in 2 weeks please contact our office at [phone].  If you are not yet signed up for [facility], please consider signing up  Please be sure medication list is accurate. If a new problem present, please set up appointment sooner than planned today.

## 2016-07-28 NOTE — Progress Notes (Signed)
HPI:   Ms.Donnelle Fuller Plan Dioguardi is a 76 y.o. female, who is here today to establish care.  Former PCP: Tollie Pizza. Last preventive routine visit: 03/2016.  Chronic medical problems: Nephrolithiasis, lower back pain,GERD, vit D deficiency, and osteopenia among some. Blood in stool last year, saw Dr Coletta Memos GI.  Follows with urologists prn.   Concerns today: Fatigue.  For the past 6 weeks she has felt more tired than usual.She attributes it to her age but would like some labs done.  She denies recent illness, changes in sleep or appetite. She tried to take 45 min naps but felt worse. Not aware of sleep apnea. She sleeps around 6 hours, feels rested.  She has not been exercising for the past 8 weeks because her husband recently had coronary stent placed and now recovering.  She denies Hx of anxiety or depression.  She still works part time, 5 days per month.  Lower back pain for 1-2 years. She has followed with ortho and Dx with DDD. According to pt, she received an epidural injection but did not help. Pain is Sharp, 8/10, no radiated, no associated LE saddle anesthesia or urine/bowel incontinence.  Pain is worse when she is in bed at night, exacerbated by turning in bed; and first thing I the morning when she gets up. + Stiffness. Improves with movement and Tylenol 1000 mg at bedtime also helps.  Denies dysuria,increased urinary frequency, gross hematuria,or decreased urine output.  She also mentions that she had some screening test done that included carotid artery  Evaluation and reported as "abnormal." She denies dizziness or syncope.   Review of Systems  Constitutional: Positive for fatigue. Negative for activity change, appetite change, fever and unexpected weight change.  HENT: Negative for mouth sores, nosebleeds and trouble swallowing.   Eyes: Negative for redness and visual disturbance.  Respiratory: Negative for cough, shortness of breath and wheezing.     Cardiovascular: Negative for chest pain, palpitations and leg swelling.  Gastrointestinal: Negative for abdominal pain, nausea and vomiting.       Negative for changes in bowel habits.  Endocrine: Negative for cold intolerance, heat intolerance, polydipsia, polyphagia and polyuria.  Genitourinary: Negative for decreased urine volume, dysuria and hematuria.  Musculoskeletal: Positive for back pain. Negative for gait problem.  Skin: Negative for pallor and rash.  Allergic/Immunologic: Positive for environmental allergies.  Neurological: Negative for dizziness, syncope, weakness, numbness and headaches.  Hematological: Negative for adenopathy. Does not bruise/bleed easily.  Psychiatric/Behavioral: Negative for confusion and sleep disturbance. The patient is nervous/anxious.       Current Outpatient Prescriptions on File Prior to Visit  Medication Sig Dispense Refill  . aspirin EC 81 MG tablet Take 81 mg by mouth daily.    . polyethylene glycol (MIRALAX / GLYCOLAX) packet Take 17 g by mouth daily as needed. For constipation.    . ranitidine (ZANTAC) 300 MG tablet Take 300 mg by mouth daily.     No current facility-administered medications on file prior to visit.      Past Medical History:  Diagnosis Date  . Acid reflux   . Allergy   . 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 Blood in stool   . Chicken pox   . Chronic kidney disease    UTI and kidneystones  . Constipation    unknown urology procedure 2006  . Hiatal hernia   . Kidney stones   . Pneumonia 1968  . Urinary tract infection  Allergies  Allergen Reactions  . Penicillins Other (See Comments)    Has patient had a PCN reaction causing immediate rash, facial/tongue/throat swelling, SOB or lightheadedness with hypotension: Yes Has patient had a PCN reaction causing severe rash involving mucus membranes or skin necrosis: No Has patient had a PCN reaction that required hospitalization No Has patient had a PCN reaction occurring  within the last 10 years: No If all of the above answers are "NO", then may proceed with Cephalosporin use. Passed out   Past Surgical History:  Procedure Laterality Date  . CATARACT EXTRACTION W/PHACO Right 02/24/2015   Procedure: CATARACT EXTRACTION PHACO AND INTRAOCULAR LENS PLACEMENT RIGHT EYE CDE=8.01;  Surgeon: Tonny Branch, MD;  Location: [facility];  Service: Ophthalmology;  Laterality: Right;  . CATARACT EXTRACTION W/PHACO Left 04/03/2015   Procedure: CATARACT EXTRACTION PHACO AND INTRAOCULAR LENS PLACEMENT (IOC);  Surgeon: Tonny Branch, MD;  Location: [facility];  Service: Ophthalmology;  Laterality: Left;  CDE:11.74  . DILATION AND CURETTAGE OF UTERUS    . LITHOTRIPSY    . TONSILLECTOMY  1951    Family History  Problem Relation Age of Onset  . Breast cancer Maternal Aunt   . Arthritis Mother   . Hypertension Mother   . Arthritis Father   . Hypertension Father     Social History   Social History  . Marital status: Married    Spouse name: N/A  . Number of children: N/A  . Years of education: N/A   Social History Main Topics  . Smoking status: Never Smoker  . Smokeless tobacco: Never Used  . Alcohol use No  . Drug use: No  . Sexual activity: Not Asked   Other Topics Concern  . None   Social History Narrative  . None    Vitals:   07/28/16 0756  BP: 110/72  Pulse: 78  Resp: 12   O2 sat at RA 96%. Body mass index is 24.47 kg/m.   Physical Exam  Nursing note and vitals reviewed. Constitutional: She is oriented to person, place, and time. She appears well-developed and well-nourished. No distress.  HENT:  Head: Atraumatic.  Mouth/Throat: Oropharynx is clear and moist and mucous membranes are normal.  Eyes: Pupils are equal, round, and reactive to light. Conjunctivae and EOM are normal.  Neck: Carotid bruit is present (Right). No tracheal deviation present. No thyroid mass and no thyromegaly present.  Cardiovascular: Normal rate and regular rhythm.   No murmur  heard. Pulses:      Dorsalis pedis pulses are 2+ on the right side, and 2+ on the left side.  Respiratory: Effort normal and breath sounds normal. No respiratory distress.  GI: Soft. She exhibits no mass. There is no hepatomegaly. There is no tenderness.  Musculoskeletal: She exhibits no edema.  Pain upon palpation of left paraspinal lumbar muscles, mild muscle spasm.  Pain is exacerbated with movement on exam table.   Lymphadenopathy:    She has no cervical adenopathy.  Neurological: She is alert and oriented to person, place, and time. She has normal strength. Gait normal.  Reflex Scores:      Patellar reflexes are 2+ on the right side and 2+ on the left side. SLR negative bilateral.  Skin: Skin is warm. No rash noted. No erythema.  Psychiatric: Her mood appears anxious.  Well groomed, good eye contact.     ASSESSMENT AND PLAN:   Ms. Bristol was seen today for establish care.  Diagnoses and all orders for this visit:  Other  fatigue  We discussed possible causes, recent stress related to husband's illness and stopping regular exercise could contribute to problem. Further recommendations will be given according to lab results. F/U in 3 months.  -     Basic metabolic panel -     TSH -     CBC -     Urinalysis, Routine w reflex microscopic  Chronic left-sided low back pain without sciatica  Reporting ortho evaluation and imaging done. It seems musculoskeletal related. Recommend OTC Asper cream with Lidocaine. Acetaminophen 500 mg max 3000 mg daily.  -     Basic metabolic panel -     Urinalysis, Routine w reflex microscopic  Bruit of right carotid artery  Asymptomatic. Carotid Duplex US will be arranged. Instructed about warning signs. Continue Aspirin for now.    Setsuko Robins G. Martinique, MD  [facility]. [place] office.

## 2016-07-29 ENCOUNTER — Encounter: Payer: Self-pay | Admitting: Family Medicine

## 2016-08-11 ENCOUNTER — Telehealth: Payer: Self-pay | Admitting: Family Medicine

## 2016-08-11 DIAGNOSIS — R0989 Other specified symptoms and signs involving the circulatory and respiratory systems: Secondary | ICD-10-CM

## 2016-08-11 NOTE — Telephone Encounter (Signed)
The order is incorrect please use VAS [id number] please re enter carotid doppler per felicia at [facility] [place]

## 2016-08-11 NOTE — Telephone Encounter (Signed)
Order corrected & re-entered.  Thanks!

## 2016-08-18 ENCOUNTER — Ambulatory Visit (HOSPITAL_COMMUNITY)
Admission: RE | Admit: 2016-08-18 | Discharge: 2016-08-18 | Disposition: A | Discharge status: Home / Self Care | Payer: Medicare HMO | Source: Ambulatory Visit | Attending: Cardiology | Admitting: Cardiology

## 2016-08-18 DIAGNOSIS — R0989 Other specified symptoms and signs involving the circulatory and respiratory systems: Secondary | ICD-10-CM | POA: Insufficient documentation

## 2016-08-18 DIAGNOSIS — I6523 Occlusion and stenosis of bilateral carotid arteries: Secondary | ICD-10-CM | POA: Insufficient documentation

## 2016-08-24 ENCOUNTER — Encounter: Payer: Self-pay | Admitting: Family Medicine

## 2016-09-30 ENCOUNTER — Encounter: Payer: Self-pay | Admitting: Family Medicine

## 2016-10-19 DIAGNOSIS — R69 Illness, unspecified: Secondary | ICD-10-CM | POA: Diagnosis not present

## 2017-01-28 ENCOUNTER — Telehealth: Payer: Self-pay | Admitting: Family Medicine

## 2017-01-28 NOTE — Telephone Encounter (Signed)
Spoke with [employer], no fax as of yet, she stated that she would have them refax orders.

## 2017-01-28 NOTE — Telephone Encounter (Signed)
Copied from [place] [phone]. Topic: Quick Communication - See Telephone Encounter >> Jan 28, 2017 10:38 AM Ahmed Prima L wrote: CRM for notification. See Telephone encounter for:   01/28/17.  [facility] called and wants to know if the office received a fax for a script for Lumbar and should brace  Call back is [phone]

## 2017-02-17 DIAGNOSIS — D2372 Other benign neoplasm of skin of left lower limb, including hip: Secondary | ICD-10-CM | POA: Diagnosis not present

## 2017-02-17 DIAGNOSIS — M79672 Pain in left foot: Secondary | ICD-10-CM | POA: Diagnosis not present

## 2017-04-11 DIAGNOSIS — R1310 Dysphagia, unspecified: Secondary | ICD-10-CM | POA: Diagnosis not present

## 2017-04-11 DIAGNOSIS — E559 Vitamin D deficiency, unspecified: Secondary | ICD-10-CM | POA: Insufficient documentation

## 2017-04-11 DIAGNOSIS — K5901 Slow transit constipation: Secondary | ICD-10-CM | POA: Diagnosis not present

## 2017-04-11 DIAGNOSIS — K219 Gastro-esophageal reflux disease without esophagitis: Secondary | ICD-10-CM | POA: Diagnosis not present

## 2017-04-11 NOTE — Progress Notes (Signed)
HPI:   Tanya Swanson is a 77 y.o. female, who is here today for her routine physical.  Last CPE: 03/2016  Regular exercise 3 or more time per week: She does exercise twice per week, she goes to the [facility].  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  Following a healthy diet: She is trying to follow a healthier diet.  She lives with her husband, who is also independent.  Chronic medical problems: Vitamin D deficiency,back pain,allergic rhinitis,GERD,hand OA. She follows with ortho. Takes Tylenol extra strength 2 tabs daily.  She followed with her GI yesterday.  Immunization History  Administered Date(s) Administered  . Influenza,inj,Quad PF,6+ Mos 10/13/2015  . Pneumococcal Conjugate-13 04/09/2016  . Pneumococcal Polysaccharide-23 01/12/2011  . Tdap 01/14/2011  . Zoster 01/12/2007   Denies depression symptoms. No falls in the past year.  Mammogram: 04/2016 [facility] 1 Colonoscopy: Virtual colonoscopy 02/2012.  1.No clinically significant polyp or constricting lesion is seen. 2.  The majority the colon is in the left abdomen, and there does appear to be transposition of the SMA and SMA, indicating a degree of intestinal malrotation.  DEXA: 02/2012.   She takes calcium with vitamin D.  She is not interested in having DEXA.   She has no concerns today. She would like blood work done today.    Review of Systems  Constitutional: Negative for appetite change, fatigue and fever.  HENT: Positive for congestion, hearing loss (Chronic.) and rhinorrhea. Negative for facial swelling, mouth sores, sore throat and trouble swallowing.   Eyes: Negative for redness and visual disturbance.  Respiratory: Negative for cough, shortness of breath and wheezing.   Cardiovascular: Negative for chest pain and leg swelling.  Gastrointestinal: Negative for abdominal pain, nausea and vomiting.       No changes in bowel habits.    Endocrine: Negative for cold intolerance, heat intolerance, polydipsia, polyphagia and polyuria.  Genitourinary: Negative for decreased urine volume, dysuria and hematuria.  Musculoskeletal: Positive for arthralgias and back pain. Negative for gait problem.  Skin: Negative for color change and rash.  Allergic/Immunologic: Positive for environmental allergies.  Neurological: Negative for syncope, weakness and headaches.  Psychiatric/Behavioral: Negative for confusion and sleep disturbance. The patient is not nervous/anxious.   All other systems reviewed and are negative.     Current Outpatient Medications on File Prior to Visit  Medication Sig Dispense Refill  . Biotin (SUPER BIOTIN) 5 MG TABS Take by mouth.    . Calcium-Vitamin D-Vitamin K [id number] MG-UNT-MCG CHEW Chew by mouth.    . Cholecalciferol (VITAMIN D3) 2000 units TABS Take 1 tablet by mouth daily.    . polyethylene glycol (MIRALAX / GLYCOLAX) packet Take 17 g by mouth daily as needed. For constipation.    . ranitidine (ZANTAC) 300 MG tablet Take 300 mg by mouth daily.     No current facility-administered medications on file prior to visit.      Past Medical History:  Diagnosis Date  . Acid reflux   . Allergy   . 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 Blood in stool   . Chicken pox   . Chronic kidney disease    UTI and kidneystones  . Constipation    unknown urology procedure 2006  . Hiatal hernia   . Kidney stones   . Pneumonia 1968  . Urinary tract infection     Past Surgical History:  Procedure Laterality Date  .  CATARACT EXTRACTION W/PHACO Right 02/24/2015   Procedure: CATARACT EXTRACTION PHACO AND INTRAOCULAR LENS PLACEMENT RIGHT EYE CDE=8.01;  Surgeon: Tonny Branch, MD;  Location: [facility];  Service: Ophthalmology;  Laterality: Right;  . CATARACT EXTRACTION W/PHACO Left 04/03/2015   Procedure: CATARACT EXTRACTION PHACO AND INTRAOCULAR LENS PLACEMENT (IOC);  Surgeon: Tonny Branch, MD;  Location: [facility];  Service:  Ophthalmology;  Laterality: Left;  CDE:11.74  . DILATION AND CURETTAGE OF UTERUS    . LITHOTRIPSY    . TONSILLECTOMY  1951    Allergies  Allergen Reactions  . Penicillins Other (See Comments)    Has patient had a PCN reaction causing immediate rash, facial/tongue/throat swelling, SOB or lightheadedness with hypotension: Yes Has patient had a PCN reaction causing severe rash involving mucus membranes or skin necrosis: No Has patient had a PCN reaction that required hospitalization No Has patient had a PCN reaction occurring within the last 10 years: No If all of the above answers are "NO", then may proceed with Cephalosporin use. Passed out    Family History  Problem Relation Age of Onset  . Breast cancer Maternal Aunt   . Arthritis Mother   . Hypertension Mother   . Arthritis Father   . Hypertension Father     Social History   Socioeconomic History  . Marital status: Married    Spouse name: Not on file  . Number of children: Not on file  . Years of education: Not on file  . Highest education level: Not on file  Occupational History  . Not on file  Social Needs  . Financial resource strain: Not on file  . Food insecurity:    Worry: Not on file    Inability: Not on file  . Transportation needs:    Medical: Not on file    Non-medical: Not on file  Tobacco Use  . Smoking status: Never Smoker  . Smokeless tobacco: Never Used  Substance and Sexual Activity  . Alcohol use: No  . Drug use: No  . Sexual activity: Not on file  Lifestyle  . Physical activity:    Days per week: Not on file    Minutes per session: Not on file  . Stress: Not on file  Relationships  . Social connections:    Talks on phone: Not on file    Gets together: Not on file    Attends religious service: Not on file    Active member of club or organization: Not on file    Attends meetings of clubs or organizations: Not on file    Relationship status: Not on file  Other Topics Concern  . Not on  file  Social History Narrative  . Not on file     Vitals:   04/12/17 0740  BP: 110/66  Pulse: 79  Resp: 12  Temp: 98 F (36.7 C)  SpO2: 97%   Body mass index is 24.49 kg/m.   Wt Readings from Last 3 Encounters:  04/12/17 156 lb 6 oz (70.9 kg)  07/28/16 156 lb 4 oz (70.9 kg)  04/03/15 148 lb (67.1 kg)    Physical Exam  Nursing note and vitals reviewed. Constitutional: She is oriented to person, place, and time. She appears well-developed and well-nourished. No distress.  HENT:  Head: Normocephalic and atraumatic.  Right Ear: Hearing, tympanic membrane, external ear and ear canal normal.  Left Ear: Hearing, tympanic membrane, external ear and ear canal normal.  Mouth/Throat: Uvula is midline, oropharynx is clear and moist and mucous  membranes are normal.  Eyes: Pupils are equal, round, and reactive to light. Conjunctivae and EOM are normal.  Neck: No tracheal deviation present. No thyromegaly present.  Cardiovascular: Normal rate and regular rhythm.  No murmur heard. Pulses:      Dorsalis pedis pulses are 2+ on the right side, and 2+ on the left side.  Varicose veins in LE, bilateral.  Respiratory: Effort normal and breath sounds normal. No respiratory distress.  GI: Soft. She exhibits no mass. There is no hepatomegaly. There is no tenderness.  Musculoskeletal: She exhibits no edema or tenderness.  Lymphadenopathy:    She has no cervical adenopathy.       Right: No supraclavicular adenopathy present.       Left: No supraclavicular adenopathy present.  Neurological: She is alert and oriented to person, place, and time. She has normal strength. No cranial nerve deficit. Gait normal.  Skin: Skin is warm. No rash noted. No erythema.  Psychiatric: She has a normal mood and affect.  Well groomed, good eye contact.      ASSESSMENT AND PLAN:  Ms. Leighanna Kirn was here today annual physical examination.   Orders Placed This Encounter  Procedures  . Basic  metabolic panel  . Lipid panel  . VITAMIN D 25 Hydroxy (Vit-D Deficiency, Fractures)    Lab Results  Component Value Date   CHOL 149 04/12/2017   HDL 60.30 04/12/2017   LDLCALC 74 04/12/2017   TRIG 71.0 04/12/2017   CHOLHDL 2 04/12/2017   Lab Results  Component Value Date   CREATININE 0.82 04/12/2017   BUN 11 04/12/2017   NA 143 04/12/2017   K 4.2 04/12/2017   CL 107 04/12/2017   CO2 30 04/12/2017     Routine general medical examination at a health care facility  We discussed the importance of regular physical activity and healthy diet for prevention of chronic illness and/or complications. Preventive guidelines reviewed. Vaccination up to date. She doe snot have a POA or living will, strongly recommended. Fall prevention. Ca++ and vit D supplementation to continue. Next CPE in a year.  Vitamin D deficiency, unspecified  No changes in current management, will follow labs done today and will give further recommendations accordingly.  -     Basic metabolic panel -     Lipid panel -     VITAMIN D 25 Hydroxy (Vit-D Deficiency, Fractures)  Gastroesophageal reflux disease without esophagitis  Continue Ranitidine. GERD precautions. Following with Dr Watt Climes.  Generalized osteoarthritis of multiple sites  Continue Tylenol daily as needed. Following with ortho for back pain.     Return in 1 year (on 04/13/2018) for routine.          Ahyan Kreeger G. Martinique, MD  [facility]. [place] office.

## 2017-04-12 ENCOUNTER — Ambulatory Visit (INDEPENDENT_AMBULATORY_CARE_PROVIDER_SITE_OTHER): Payer: Medicare HMO | Admitting: Family Medicine

## 2017-04-12 ENCOUNTER — Other Ambulatory Visit: Payer: Self-pay | Admitting: Family Medicine

## 2017-04-12 ENCOUNTER — Encounter: Payer: Self-pay | Admitting: Family Medicine

## 2017-04-12 VITALS — BP 110/66 | HR 79 | Temp 98.0°F | Resp 12 | Ht 67.0 in | Wt 156.4 lb

## 2017-04-12 DIAGNOSIS — K219 Gastro-esophageal reflux disease without esophagitis: Secondary | ICD-10-CM | POA: Insufficient documentation

## 2017-04-12 DIAGNOSIS — Z0000 Encounter for general adult medical examination without abnormal findings: Secondary | ICD-10-CM | POA: Diagnosis not present

## 2017-04-12 DIAGNOSIS — E559 Vitamin D deficiency, unspecified: Secondary | ICD-10-CM | POA: Diagnosis not present

## 2017-04-12 DIAGNOSIS — Z139 Encounter for screening, unspecified: Secondary | ICD-10-CM

## 2017-04-12 DIAGNOSIS — M159 Polyosteoarthritis, unspecified: Secondary | ICD-10-CM | POA: Diagnosis not present

## 2017-04-12 LAB — BASIC METABOLIC PANEL
BUN: 11 mg/dL (ref 6–23)
CO2: 30 meq/L (ref 19–32)
Calcium: 9.3 mg/dL (ref 8.4–10.5)
Chloride: 107 mEq/L (ref 96–112)
Creatinine, Ser: 0.82 mg/dL (ref 0.40–1.20)
GFR: 71.63 mL/min (ref >60.00)
GLUCOSE: 85 mg/dL (ref 70–99)
POTASSIUM: 4.2 meq/L (ref 3.5–5.1)
SODIUM: 143 meq/L (ref 135–145)

## 2017-04-12 LAB — LIPID PANEL
CHOL/HDL RATIO: 2
Cholesterol: 149 mg/dL (ref 0–200)
HDL: 60.3 mg/dL (ref >39.00)
LDL CALC: 74 mg/dL (ref 0–99)
NONHDL: 88.26
Triglycerides: 71 mg/dL (ref 0.0–149.0)
VLDL: 14.2 mg/dL (ref 0.0–40.0)

## 2017-04-12 LAB — VITAMIN D 25 HYDROXY (VIT D DEFICIENCY, FRACTURES): VITD: 75.48 ng/mL (ref 30.00–100.00)

## 2017-04-12 NOTE — Patient Instructions (Addendum)
A few things to remember from today's visit:   Routine general medical examination at a health care facility  Vitamin D deficiency, unspecified - Plan: Basic metabolic panel, Lipid panel  A few tips:  -As we age balance is not as good as it was, so there is a higher risks for falls. Please remove small rugs and furniture that is "in your way" and could increase the risk of falls. Stretching exercises may help with fall prevention: Yoga and Tai Chi are some examples. Low impact exercise is better, so you are not very achy the next day.  -Sun screen and avoidance of direct sun light recommended. Caution with dehydration, if working outdoors be sure to drink enough fluids.  - Some medications are not safe as we age, increases the risk of side effects and can potentially interact with other medication you are also taken;  including some of over the counter medications. Be sure to let me know when you start a new medication even if it is a dietary/vitamin supplement.   -Healthy diet low in red meet/animal fat and sugar + regular physical activity is recommended.      Please be sure medication list is accurate. If a new problem present, please set up appointment sooner than planned today.

## 2017-04-14 ENCOUNTER — Encounter: Payer: Self-pay | Admitting: Family Medicine

## 2017-05-03 ENCOUNTER — Ambulatory Visit
Admission: RE | Admit: 2017-05-03 | Discharge: 2017-05-03 | Disposition: A | Discharge status: Home / Self Care | Payer: Medicare HMO | Source: Ambulatory Visit | Attending: Family Medicine | Admitting: Family Medicine

## 2017-05-03 DIAGNOSIS — Z1231 Encounter for screening mammogram for malignant neoplasm of breast: Secondary | ICD-10-CM | POA: Diagnosis not present

## 2017-05-03 DIAGNOSIS — Z139 Encounter for screening, unspecified: Secondary | ICD-10-CM

## 2017-05-17 DIAGNOSIS — Z1212 Encounter for screening for malignant neoplasm of rectum: Secondary | ICD-10-CM | POA: Diagnosis not present

## 2017-05-17 DIAGNOSIS — Z1211 Encounter for screening for malignant neoplasm of colon: Secondary | ICD-10-CM | POA: Diagnosis not present

## 2017-05-25 DIAGNOSIS — H5200 Hypermetropia, unspecified eye: Secondary | ICD-10-CM | POA: Diagnosis not present

## 2017-05-27 LAB — COLOGUARD: COLOGUARD: POSITIVE

## 2017-06-17 DIAGNOSIS — R195 Other fecal abnormalities: Secondary | ICD-10-CM | POA: Diagnosis not present

## 2017-06-17 DIAGNOSIS — K5730 Diverticulosis of large intestine without perforation or abscess without bleeding: Secondary | ICD-10-CM | POA: Diagnosis not present

## 2017-06-17 DIAGNOSIS — D126 Benign neoplasm of colon, unspecified: Secondary | ICD-10-CM | POA: Diagnosis not present

## 2017-06-21 DIAGNOSIS — D126 Benign neoplasm of colon, unspecified: Secondary | ICD-10-CM | POA: Diagnosis not present

## 2017-08-10 DIAGNOSIS — S90862A Insect bite (nonvenomous), left foot, initial encounter: Secondary | ICD-10-CM | POA: Diagnosis not present

## 2017-08-10 DIAGNOSIS — X32XXXD Exposure to sunlight, subsequent encounter: Secondary | ICD-10-CM | POA: Diagnosis not present

## 2017-08-10 DIAGNOSIS — L570 Actinic keratosis: Secondary | ICD-10-CM | POA: Diagnosis not present

## 2017-10-02 DIAGNOSIS — J302 Other seasonal allergic rhinitis: Secondary | ICD-10-CM | POA: Diagnosis not present

## 2017-10-02 DIAGNOSIS — H60502 Unspecified acute noninfective otitis externa, left ear: Secondary | ICD-10-CM | POA: Diagnosis not present

## 2017-10-12 ENCOUNTER — Encounter: Payer: Self-pay | Admitting: Family Medicine

## 2017-10-12 ENCOUNTER — Ambulatory Visit: Payer: Medicare HMO | Admitting: Family Medicine

## 2017-10-12 VITALS — BP 104/70 | HR 70 | Temp 98.0°F | Resp 12 | Ht 67.0 in | Wt 158.4 lb

## 2017-10-12 DIAGNOSIS — Z1211 Encounter for screening for malignant neoplasm of colon: Secondary | ICD-10-CM

## 2017-10-12 DIAGNOSIS — H9209 Otalgia, unspecified ear: Secondary | ICD-10-CM

## 2017-10-12 DIAGNOSIS — H9313 Tinnitus, bilateral: Secondary | ICD-10-CM

## 2017-10-12 DIAGNOSIS — H9193 Unspecified hearing loss, bilateral: Secondary | ICD-10-CM | POA: Diagnosis not present

## 2017-10-12 DIAGNOSIS — Z23 Encounter for immunization: Secondary | ICD-10-CM | POA: Diagnosis not present

## 2017-10-12 NOTE — Patient Instructions (Addendum)
A few things to remember from today's visit:   Tinnitus of both ears - Plan: Ambulatory referral to ENT  Bilateral hearing loss, unspecified hearing loss type - Plan: Ambulatory referral to ENT  Colon cancer screening Tinnitus Tinnitus refers to hearing a sound when there is no actual source for that sound. This is often described as ringing in the ears. However, people with this condition may hear a variety of noises. A person may hear the sound in one ear or in both ears. The sounds of tinnitus can be soft, loud, or somewhere in between. Tinnitus can last for a few seconds or can be constant for days. It may go away without treatment and come back at various times. When tinnitus is constant or happens often, it can lead to other problems, such as trouble sleeping and trouble concentrating. Almost everyone experiences tinnitus at some point. Tinnitus that is long-lasting (chronic) or comes back often is a problem that may require medical attention. What are the causes? The cause of tinnitus is often not known. In some cases, it can result from other problems or conditions, including:  Exposure to loud noises from machinery, music, or other sources.  Hearing loss.  Ear or sinus infections.  Earwax buildup.  A foreign object in the ear.  Use of certain medicines.  Use of alcohol and caffeine.  High blood pressure.  Heart diseases.  Anemia.  Allergies.  Meniere disease.  Thyroid problems.  Tumors.  An enlarged part of a weakened blood vessel (aneurysm).  What are the signs or symptoms? The main symptom of tinnitus is hearing a sound when there is no source for that sound. It may sound like:  Buzzing.  Roaring.  Ringing.  Blowing air, similar to the sound heard when you listen to a seashell.  Hissing.  Whistling.  Sizzling.  Humming.  Running water.  A sustained musical note.  How is this diagnosed? Tinnitus is diagnosed based on your symptoms. Your  health care provider will do a physical exam. A comprehensive hearing exam (audiologic exam) will be done if your tinnitus:  Affects only one ear (unilateral).  Causes hearing difficulties.  Lasts 6 months or longer.  You may also need to see a health care provider who specializes in hearing disorders (audiologist). You may be asked to complete a questionnaire to determine the severity of your tinnitus. Tests may be done to help determine the cause and to rule out other conditions. These can include:  Imaging studies of your head and brain, such as: ? A CT scan. ? An MRI.  An imaging study of your blood vessels (angiogram).  How is this treated? Treating an underlying medical condition can sometimes make tinnitus go away. If your tinnitus continues, other treatments may include:  Medicines, such as certain antidepressants or sleeping aids.  Sound generators to mask the tinnitus. These include: ? Tabletop sound machines that play relaxing sounds to help you fall asleep. ? Wearable devices that fit in your ear and play sounds or music. ? A small device that uses headphones to deliver a signal embedded in music (acoustic neural stimulation). In time, this may change the pathways of your brain and make you less sensitive to tinnitus. This device is used for very severe cases when no other treatment is working.  Therapy and counseling to help you manage the stress of living with tinnitus.  Using hearing aids or cochlear implants, if your tinnitus is related to hearing loss.  Follow these instructions  at home:  When possible, avoid being in loud places and being exposed to loud sounds.  Wear hearing protection, such as earplugs, when you are exposed to loud noises.  Do not take stimulants, such as nicotine, alcohol, or caffeine.  Practice techniques for reducing stress, such as meditation, yoga, or deep breathing.  Use a white noise machine, a humidifier, or other devices to mask  the sound of tinnitus.  Sleep with your head slightly raised. This may reduce the impact of tinnitus.  Try to get plenty of rest each night. Contact a health care provider if:  You have tinnitus in just one ear.  Your tinnitus continues for 3 weeks or longer without stopping.  Home care measures are not helping.  You have tinnitus after a head injury.  You have tinnitus along with any of the following: ? Dizziness. ? Loss of balance. ? Nausea and vomiting. This information is not intended to replace advice given to you by your health care provider. Make sure you discuss any questions you have with your health care provider. Document Released: 12/28/2004 Document Revised: 08/31/2015 Document Reviewed: 05/30/2013 Elsevier Interactive Patient Education  2018 [employer].   Please be sure medication list is accurate. If a new problem present, please set up appointment sooner than planned today.

## 2017-10-12 NOTE — Progress Notes (Signed)
ACUTE VISIT  HPI:  Chief Complaint  Patient presents with  . Left ear pain    Tanya Swanson is a 77 y.o.female here today complaining of about 3 weeks of mild bilateral ear ache and tinnitus.  [facility] and according to patient, she was prescribed antibiotic eardrops. Ear ache is now "not bad",/10, intermittently.  She also has noted progressive and constant hearing loss for the past 2 to 3 months. Bilateral tinnitus noted 3 to 4 weeks ago, it is constant and seems to be worse at night. No history of recent travel, sick contacts, or ear trauma.  She denies headache, visual changes, dizziness, nausea, vomiting, or focal deficit.   Otalgia   There is pain in both ears. This is a new problem. The current episode started 1 to 4 weeks ago. The problem occurs every few hours. The problem has been gradually improving. There has been no fever. The pain is at a severity of 4/10. The pain is mild. Associated symptoms include hearing loss and rhinorrhea. Pertinent negatives include no abdominal pain, coughing, diarrhea, ear discharge, headaches, neck pain, rash, sore throat or vomiting. She has tried antibiotics and ear drops for the symptoms. The treatment provided moderate relief. There is no history of a chronic ear infection, hearing loss or a tympanostomy tube.     She has history of allergy rhinitis, she has had some nasal congestion and rhinorrhea. She has not tried OTC medications.   She also would like to go through her recent colonoscopy. She sees Dr Watt Climes, last visit I see on 04/11/17 to address GERD.  According to patient, she had a colonoscopy in 06/2017 and a "precancerous" polyp was found.  She states that she understood it was up to her PCP when she will need another colonoscopy done.  Colonoscopy was done because she had a positive Cologuard test. In the past she has had visual colonoscopy and rectocele  anoscopy. According to patient, her colonoscopy was difficult to perform. She has not noted abdominal pain, changes in bowel habits, blood in the stool, or melena. FHx negative for colon cancer.  Review of Systems  Constitutional: Negative for activity change, appetite change, fatigue and fever.  HENT: Positive for congestion, ear pain, hearing loss, postnasal drip, rhinorrhea and tinnitus. Negative for ear discharge, mouth sores, sinus pressure, sore throat, trouble swallowing and voice change.   Eyes: Negative for redness and visual disturbance.  Respiratory: Negative for cough, shortness of breath and wheezing.   Cardiovascular: Negative for chest pain and palpitations.  Gastrointestinal: Negative for abdominal pain, diarrhea, nausea and vomiting.  Musculoskeletal: Negative for gait problem, myalgias and neck pain.  Skin: Negative for rash.  Allergic/Immunologic: Positive for environmental allergies.  Neurological: Negative for dizziness, syncope, weakness and headaches.      Current Outpatient Medications on File Prior to Visit  Medication Sig Dispense Refill  . Acetaminophen (TYLENOL PO) Take 500 mg by mouth as needed.    . Biotin (SUPER BIOTIN) 5 MG TABS Take by mouth.    . Calcium-Vitamin D-Vitamin K [id number] MG-UNT-MCG CHEW Chew by mouth.    . cetirizine (ZYRTEC) 5 MG tablet Take by mouth.    . Cholecalciferol (VITAMIN D3) 2000 units TABS Take 1 tablet by mouth daily.    Marland Kitchen neomycin-polymyxin-hydrocortisone (CORTISPORIN) 3.5-10000-1 OTIC suspension ADMINISTER 4 DROPS INTO EARS 4 (FOUR) TIMES A DAY FOR 7-10 DAYS.  0  . polyethylene glycol (MIRALAX / GLYCOLAX) packet  Take 17 g by mouth daily as needed. For constipation.    . ranitidine (ZANTAC) 300 MG tablet Take 300 mg by mouth daily.     No current facility-administered medications on file prior to visit.      Past Medical History:  Diagnosis Date  . Acid reflux   . Allergy   . 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 Blood in  stool   . Chicken pox   . Chronic kidney disease    UTI and kidneystones  . Constipation    unknown urology procedure 2006  . Hiatal hernia   . Kidney stones   . Pneumonia 1968  . Urinary tract infection    Allergies  Allergen Reactions  . Penicillins Other (See Comments)    Has patient had a PCN reaction causing immediate rash, facial/tongue/throat swelling, SOB or lightheadedness with hypotension: Yes Has patient had a PCN reaction causing severe rash involving mucus membranes or skin necrosis: No Has patient had a PCN reaction that required hospitalization No Has patient had a PCN reaction occurring within the last 10 years: No If all of the above answers are "NO", then may proceed with Cephalosporin use. Passed out Has patient had a PCN reaction causing immediate rash, facial/tongue/throat swelling, SOB or lightheadedness with hypotension: Yes Has patient had a PCN reaction causing severe rash involving mucus membranes or skin necrosis: No Has patient had a PCN reaction that required hospitalization No Has patient had a PCN reaction occurring within the last 10 years: No If all of the above answers are "NO", then may proceed with Cephalosporin use. Passed out fainted    Social History   Socioeconomic History  . Marital status: Married    Spouse name: Not on file  . Number of children: Not on file  . Years of education: Not on file  . Highest education level: Not on file  Occupational History  . Not on file  Social Needs  . Financial resource strain: Not on file  . Food insecurity:    Worry: Not on file    Inability: Not on file  . Transportation needs:    Medical: Not on file    Non-medical: Not on file  Tobacco Use  . Smoking status: Never Smoker  . Smokeless tobacco: Never Used  Substance and Sexual Activity  . Alcohol use: No  . Drug use: No  . Sexual activity: Not on file  Lifestyle  . Physical activity:    Days per week: Not on file    Minutes per  session: Not on file  . Stress: Not on file  Relationships  . Social connections:    Talks on phone: Not on file    Gets together: Not on file    Attends religious service: Not on file    Active member of club or organization: Not on file    Attends meetings of clubs or organizations: Not on file    Relationship status: Not on file  Other Topics Concern  . Not on file  Social History Narrative  . Not on file    Vitals:   10/12/17 0928  BP: 104/70  Pulse: 70  Resp: 12  Temp: 98 F (36.7 C)  SpO2: 99%   Body mass index is 24.81 kg/m.   Physical Exam  Nursing note and vitals reviewed. Constitutional: She is oriented to person, place, and time. She appears well-developed and well-nourished. She does not appear ill. No distress.  HENT:  Head: Atraumatic.  Right Ear: Tympanic membrane, external ear and ear canal normal.  Left Ear: Tympanic membrane, external ear and ear canal normal.  Nose: Rhinorrhea present. Right sinus exhibits no maxillary sinus tenderness and no frontal sinus tenderness. Left sinus exhibits no maxillary sinus tenderness and no frontal sinus tenderness.  Mouth/Throat: Oropharynx is clear and moist and mucous membranes are normal.  Eyes: Conjunctivae are normal.  Neck: Carotid bruit is not present.  Cardiovascular: Normal rate and regular rhythm.  No murmur heard. Respiratory: Effort normal and breath sounds normal. No respiratory distress.  Lymphadenopathy:       Head (right side): No submandibular adenopathy present.       Head (left side): No submandibular adenopathy present.    She has no cervical adenopathy.  Neurological: She is alert and oriented to person, place, and time. She has normal strength. Gait normal.  Skin: Skin is warm. No rash noted. No erythema.  Psychiatric: She has a normal mood and affect.  Well groomed, good eye contact.    ASSESSMENT AND PLAN:   Ms. Tanya Swanson was seen today for left ear pain.  Diagnoses and all orders for  this visit:  Tinnitus of both ears  Educated about diagnosis and prognosis. The fact that it is bilateral is reassuring but because this is a new problem, I recommend ENT evaluation. Recommend avoiding NSAIDs. Instructed about warning signs.  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  Because she is reporting this as a new problem, ENT evaluation recommended.  -     Ambulatory referral to ENT  Colon cancer screening Recommend signing a release form, so we can obtain copy of colonoscopy report.  Earache Improved after topical antibiotic treatment but not quite resolved. Examination does not suggest external otitis. Instructed about warning signs. ENT referral was placed.  Encounter for immunization -     Flu vaccine HIGH DOSE PF     25 min face to face OV. > 50% was dedicated to discussion of Dx, prognosis, treatment options, and coordination of care.  At the time of the visit I do not have a copy of the colonoscopy report. We discussed current recommendations in regard to colon cancer screening as well as polyps. If in fact she had a adenomatous polyp, she may need a colonoscopy in 5 years.      Tanya Gilles G. Martinique, MD  [facility]. [place] office.

## 2017-10-21 DIAGNOSIS — H903 Sensorineural hearing loss, bilateral: Secondary | ICD-10-CM | POA: Diagnosis not present

## 2017-10-21 DIAGNOSIS — H9313 Tinnitus, bilateral: Secondary | ICD-10-CM | POA: Diagnosis not present

## 2017-10-24 ENCOUNTER — Encounter: Payer: Self-pay | Admitting: Family Medicine

## 2017-11-02 DIAGNOSIS — H905 Unspecified sensorineural hearing loss: Secondary | ICD-10-CM | POA: Diagnosis not present

## 2018-04-06 ENCOUNTER — Other Ambulatory Visit: Payer: Self-pay | Admitting: Family Medicine

## 2018-04-06 DIAGNOSIS — Z1231 Encounter for screening mammogram for malignant neoplasm of breast: Secondary | ICD-10-CM

## 2018-04-10 ENCOUNTER — Telehealth: Payer: Self-pay | Admitting: *Deleted

## 2018-04-10 NOTE — Telephone Encounter (Signed)
Copied from [place] [id number]. Topic: Appointment Scheduling - Scheduling Inquiry for Clinic >> Apr 10, 2018 10:29 AM Rutherford Nail, NT wrote: Reason for CRM: Patient calling to requesting a CPE appointment. FC line at office was ringing busy x3. Last CPE was 04/12/2017.  CB#: [phone]

## 2018-04-10 NOTE — Telephone Encounter (Signed)
As far as she has no new problem or concerns we can schedule when public health situation normalized. Thanks, BJ

## 2018-04-10 NOTE — Telephone Encounter (Signed)
Patient offered Web visit [place], she does have a tablet and has been trying to pull up my chart all day and it will not let her. Patient willing to reschedule for a later time if necessary. Please advise

## 2018-04-11 NOTE — Telephone Encounter (Signed)
Spoke with patient and gave recommendations per Dr. Martinique. Patient verbalized understanding and stated that she doesn't have any new problems or concerns and would call in about a month to schedule her CPE. Nothing further needed at this time.

## 2018-04-18 DIAGNOSIS — K219 Gastro-esophageal reflux disease without esophagitis: Secondary | ICD-10-CM | POA: Diagnosis not present

## 2018-04-18 DIAGNOSIS — K5901 Slow transit constipation: Secondary | ICD-10-CM | POA: Diagnosis not present

## 2018-05-31 ENCOUNTER — Ambulatory Visit: Payer: Medicare HMO

## 2018-06-01 ENCOUNTER — Ambulatory Visit
Admission: RE | Admit: 2018-06-01 | Discharge: 2018-06-01 | Disposition: A | Discharge status: Home / Self Care | Payer: Medicare HMO | Source: Ambulatory Visit | Attending: Family Medicine | Admitting: Family Medicine

## 2018-06-01 ENCOUNTER — Other Ambulatory Visit: Payer: Self-pay

## 2018-06-01 ENCOUNTER — Ambulatory Visit: Payer: Medicare HMO

## 2018-06-01 DIAGNOSIS — Z1231 Encounter for screening mammogram for malignant neoplasm of breast: Secondary | ICD-10-CM

## 2018-06-02 DIAGNOSIS — S01511A Laceration without foreign body of lip, initial encounter: Secondary | ICD-10-CM | POA: Diagnosis not present

## 2018-06-09 DIAGNOSIS — S01511D Laceration without foreign body of lip, subsequent encounter: Secondary | ICD-10-CM | POA: Diagnosis not present

## 2018-08-12 DIAGNOSIS — W57XXXA Bitten or stung by nonvenomous insect and other nonvenomous arthropods, initial encounter: Secondary | ICD-10-CM | POA: Diagnosis not present

## 2018-08-12 DIAGNOSIS — S90461A Insect bite (nonvenomous), right great toe, initial encounter: Secondary | ICD-10-CM | POA: Diagnosis not present

## 2018-09-13 ENCOUNTER — Ambulatory Visit (INDEPENDENT_AMBULATORY_CARE_PROVIDER_SITE_OTHER): Payer: Medicare HMO | Admitting: Family Medicine

## 2018-09-13 ENCOUNTER — Other Ambulatory Visit: Payer: Self-pay

## 2018-09-13 ENCOUNTER — Encounter: Payer: Self-pay | Admitting: Family Medicine

## 2018-09-13 VITALS — BP 120/66 | HR 78 | Temp 97.5°F | Resp 12 | Ht 67.0 in | Wt 163.5 lb

## 2018-09-13 DIAGNOSIS — K219 Gastro-esophageal reflux disease without esophagitis: Secondary | ICD-10-CM

## 2018-09-13 DIAGNOSIS — Z780 Asymptomatic menopausal state: Secondary | ICD-10-CM | POA: Diagnosis not present

## 2018-09-13 DIAGNOSIS — Z0000 Encounter for general adult medical examination without abnormal findings: Secondary | ICD-10-CM

## 2018-09-13 DIAGNOSIS — E559 Vitamin D deficiency, unspecified: Secondary | ICD-10-CM

## 2018-09-13 LAB — VITAMIN D 25 HYDROXY (VIT D DEFICIENCY, FRACTURES): VITD: 75.94 ng/mL (ref 30.00–100.00)

## 2018-09-13 LAB — COMPREHENSIVE METABOLIC PANEL WITH GFR
ALT: 10 U/L (ref 0–35)
AST: 17 U/L (ref 0–37)
Albumin: 4.1 g/dL (ref 3.5–5.2)
Alkaline Phosphatase: 83 U/L (ref 39–117)
BUN: 9 mg/dL (ref 6–23)
CO2: 28 meq/L (ref 19–32)
Calcium: 9.3 mg/dL (ref 8.4–10.5)
Chloride: 105 meq/L (ref 96–112)
Creatinine, Ser: 0.79 mg/dL (ref 0.40–1.20)
GFR: 70.1 mL/min
Glucose, Bld: 87 mg/dL (ref 70–99)
Potassium: 4.1 meq/L (ref 3.5–5.1)
Sodium: 142 meq/L (ref 135–145)
Total Bilirubin: 0.5 mg/dL (ref 0.2–1.2)
Total Protein: 6.7 g/dL (ref 6.0–8.3)

## 2018-09-13 NOTE — Progress Notes (Signed)
HPI:   [name] is a 78 y.o. female, who is here today for her routine physical and AWV.  Last CPE: 04/12/17  Regular exercise 3 or more time per week: She is walking 2-3 per week Following a healthy diet: Yes She lives with her husband.  She works part time, 5-6 hours per week.  Chronic medical problems: OA,GERD, hearing loss,anxiety,and Vit D deficiency.  GERD, she takes famotidine 20 mg daily as needed. She has history of constipation, which seems to be well controlled with MiraLAX daily as needed. She follows with GI as needed, Dr. Lum Babe.  Immunization History  Administered Date(s) Administered  . Influenza, High Dose Seasonal PF 10/12/2017  . Influenza,inj,Quad PF,6+ Mos 10/13/2015  . Pneumococcal Conjugate-13 04/09/2016  . Pneumococcal Polysaccharide-23 01/12/2011  . Tdap 01/14/2011  . Zoster 01/12/2007   Mammogram: 06/01/18 Colonoscopy: 06/17/17,adenomathous polyp x 1. Follow up as needed was recommended. DEXA: 02/2012,osteopenia.   AWV: Independent ADL's and IADL's. No falls in the past year and denies depression symptoms.  Functional Status Survey: Is the patient deaf or have difficulty hearing?: Yes(Hearing aids.) Does the patient have difficulty seeing, even when wearing glasses/contacts?: No Does the patient have difficulty concentrating, remembering, or making decisions?: No Does the patient have difficulty walking or climbing stairs?: No Does the patient have difficulty dressing or bathing?: No Does the patient have difficulty doing errands alone such as visiting a doctor's office or shopping?: No  Fall Risk  09/13/2018  Falls in the past year? 0  Number falls in past yr: 0  Injury with Fall? 0  Follow up Education provided   Providers she sees regularly: Eye care provider: Dr Delorise Royals. Dr Lum Babe, gastro.  Depression screen [facility] 2/9 04/12/2017  Decreased Interest 0  Down, Depressed, Hopeless 0  PHQ - 2 Score 0    Mini-Cog -  09/13/18 1343    Normal clock drawing test?  yes    How many words correct?  3        Hearing Screening   125Hz  250Hz  500Hz  1000Hz  2000Hz  3000Hz  4000Hz  6000Hz  8000Hz   Right ear:           Left ear:             Visual Acuity Screening   Right eye Left eye Both eyes  Without correction: 20/30 20/25 20/20   With correction:      She requesting to be "checked for everything." She has no complaints today.  Lab Results  Component Value Date   CHOL 149 04/12/2017   HDL 60.30 04/12/2017   LDLCALC 74 04/12/2017   TRIG 71.0 04/12/2017   CHOLHDL 2 04/12/2017    Vitamin D deficiency, currently she is on vitamin D supplementation 2000 units daily.  Review of Systems  Constitutional: Negative for appetite change, fatigue and fever.  HENT: Positive for tinnitus (chronic). Negative for hearing loss and mouth sores.   Eyes: Negative for redness and visual disturbance.  Respiratory: Negative for cough, shortness of breath and wheezing.   Cardiovascular: Negative for chest pain and leg swelling.  Gastrointestinal: Negative for abdominal pain, blood in stool, nausea and vomiting.       No changes in bowel habits.  Endocrine: Negative for cold intolerance, heat intolerance, polydipsia, polyphagia and polyuria.  Genitourinary: Negative for decreased urine volume, dysuria and hematuria.  Musculoskeletal: Positive for arthralgias. Negative for gait problem and myalgias.  Skin: Negative for color change and rash.  Allergic/Immunologic: Positive for environmental allergies.  Neurological: Negative for syncope, weakness and headaches.  Psychiatric/Behavioral: Negative for confusion and sleep disturbance. The patient is nervous/anxious.   All other systems reviewed and are negative.  Current Outpatient Medications on File Prior to Visit  Medication Sig Dispense Refill  . Acetaminophen (TYLENOL PO) Take 500 mg by mouth as needed.    . Biotin (SUPER BIOTIN) 5 MG TABS Take by mouth.    .  Calcium-Vitamin D-Vitamin K [id number] MG-UNT-MCG CHEW Chew by mouth.    . Cholecalciferol (VITAMIN D3) 50 MCG (2000 UT) capsule Take by mouth.    . Famotidine (PEPCID PO) Take by mouth daily.    Marland Kitchen neomycin-polymyxin-hydrocortisone (CORTISPORIN) 3.5-10000-1 OTIC suspension ADMINISTER 4 DROPS INTO EARS 4 (FOUR) TIMES A DAY FOR 7-10 DAYS.  0  . polyethylene glycol (MIRALAX / GLYCOLAX) packet Take 17 g by mouth daily as needed. For constipation.    . cetirizine (ZYRTEC) 5 MG tablet Take by mouth.     No current facility-administered medications on file prior to visit.    Past Medical History:  Diagnosis Date  . Acid reflux   . Allergy   . 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 Blood in stool   . Chicken pox   . Chronic kidney disease    UTI and kidneystones  . Constipation    unknown urology procedure 2006  . Hiatal hernia   . Kidney stones   . Pneumonia 1968  . Urinary tract infection     Past Surgical History:  Procedure Laterality Date  . CATARACT EXTRACTION W/PHACO Right 02/24/2015   Procedure: CATARACT EXTRACTION PHACO AND INTRAOCULAR LENS PLACEMENT RIGHT EYE CDE=8.01;  Surgeon: Tonny Branch, MD;  Location: [facility];  Service: Ophthalmology;  Laterality: Right;  . CATARACT EXTRACTION W/PHACO Left 04/03/2015   Procedure: CATARACT EXTRACTION PHACO AND INTRAOCULAR LENS PLACEMENT (IOC);  Surgeon: Tonny Branch, MD;  Location: [facility];  Service: Ophthalmology;  Laterality: Left;  CDE:11.74  . DILATION AND CURETTAGE OF UTERUS    . LITHOTRIPSY    . TONSILLECTOMY  1951    Allergies  Allergen Reactions  . Penicillins Other (See Comments)    Has patient had a PCN reaction causing immediate rash, facial/tongue/throat swelling, SOB or lightheadedness with hypotension: Yes Has patient had a PCN reaction causing severe rash involving mucus membranes or skin necrosis: No Has patient had a PCN reaction that required hospitalization No Has patient had a PCN reaction occurring within the last 10 years: No If  all of the above answers are "NO", then may proceed with Cephalosporin use. Passed out Has patient had a PCN reaction causing immediate rash, facial/tongue/throat swelling, SOB or lightheadedness with hypotension: Yes Has patient had a PCN reaction causing severe rash involving mucus membranes or skin necrosis: No Has patient had a PCN reaction that required hospitalization No Has patient had a PCN reaction occurring within the last 10 years: No If all of the above answers are "NO", then may proceed with Cephalosporin use. Passed out fainted Has patient had a PCN reaction causing immediate rash, facial/tongue/throat swelling, SOB or lightheadedness with hypotension: Yes Has patient had a PCN reaction causing severe rash involving mucus membranes or skin necrosis: No Has patient had a PCN reaction that required hospitalization No Has patient had a PCN reaction occurring within the last 10 years: No If all of the above answers are "NO", then may proceed with Cephalosporin use. Passed out fainted Has patient had a PCN reaction causing immediate rash, facial/tongue/throat swelling, SOB or lightheadedness  with hypotension: Yes Has patient had a PCN reaction causing severe rash involving mucus membranes or skin necrosis: No Has patient had a PCN reaction that required hospitalization No Has patient had a PCN reaction occurring within the last 10 years: No If all of the above answers are "NO", then may proceed with Cephalosporin use. Passed out Has patient had a PCN reaction causing immediate rash, facial/tongue/throat swelling, SOB or lightheadedness with hypotension: Yes Has patient had a PCN reaction causing severe rash involving mucus membranes or skin necrosis: No Has patient had a PCN reaction that required hospitalization No Has patient had a PCN reaction occurring within the last 10 years: No If all of the above answers are "NO", then may proceed with Cephalosporin use. Passed out  fainted    Family History  Problem Relation Age of Onset  . Breast cancer Maternal Aunt   . Arthritis Mother   . Hypertension Mother   . Arthritis Father   . Hypertension Father     Social History   Socioeconomic History  . Marital status: Married    Spouse name: Not on file  . Number of children: Not on file  . Years of education: Not on file  . Highest education level: Not on file  Occupational History  . Not on file  Social Needs  . Financial resource strain: Not on file  . Food insecurity    Worry: Not on file    Inability: Not on file  . Transportation needs    Medical: Not on file    Non-medical: Not on file  Tobacco Use  . Smoking status: Never Smoker  . Smokeless tobacco: Never Used  Substance and Sexual Activity  . Alcohol use: No  . Drug use: No  . Sexual activity: Not on file  Lifestyle  . Physical activity    Days per week: Not on file    Minutes per session: Not on file  . Stress: Not on file  Relationships  . Social Herbalist on phone: Not on file    Gets together: Not on file    Attends religious service: Not on file    Active member of club or organization: Not on file    Attends meetings of clubs or organizations: Not on file    Relationship status: Not on file  Other Topics Concern  . Not on file  Social History Narrative  . Not on file   Vitals:   09/13/18 1100  BP: 120/66  Pulse: 78  Resp: 12  Temp: (!) 97.5 F (36.4 C)  SpO2: 97%   Body mass index is 25.61 kg/m.   Wt Readings from Last 3 Encounters:  09/13/18 163 lb 8 oz (74.2 kg)  10/12/17 158 lb 6 oz (71.8 kg)  04/12/17 156 lb 6 oz (70.9 kg)   Physical Exam  Nursing note and vitals reviewed. Constitutional: She is oriented to person, place, and time. She appears well-developed and well-nourished. No distress.  HENT:  Head: Normocephalic and atraumatic.  Right Ear: Tympanic membrane, external ear and ear canal normal.  Left Ear: Tympanic membrane,  external ear and ear canal normal.  Mouth/Throat: Uvula is midline, oropharynx is clear and moist and mucous membranes are normal.  Cerumen excess bilateral,TM's seen partially.  Eyes: Pupils are equal, round, and reactive to light. Conjunctivae and EOM are normal.  Neck: No tracheal deviation present.  Cardiovascular: Normal rate and regular rhythm.  No murmur heard. Pulses:  Dorsalis pedis pulses are 2+ on the right side and 2+ on the left side.  Respiratory: Effort normal and breath sounds normal. No respiratory distress.  GI: Soft. She exhibits no mass. There is no hepatomegaly. There is no abdominal tenderness.  Musculoskeletal:        General: No edema.     Comments: No signs of synovitis appreciated.  Lymphadenopathy:    She has no cervical adenopathy.       Right: No supraclavicular adenopathy present.       Left: No supraclavicular adenopathy present.  Neurological: She is alert and oriented to person, place, and time. She has normal strength. No cranial nerve deficit.  Reflex Scores:      Bicep reflexes are 2+ on the right side and 2+ on the left side.      Patellar reflexes are 2+ on the right side and 2+ on the left side. Stable gait with no assistance.  Skin: Skin is warm. No rash noted. No erythema.  Psychiatric: Her mood appears anxious. Cognition and memory are normal.  Well groomed, good eye contact.    ASSESSMENT AND PLAN:  Ms. Amaziah Sona was here today annual physical examination.   Orders Placed This Encounter  Procedures  . DEXAScan  . VITAMIN D 25 Hydroxy (Vit-D Deficiency, Fractures)  . Comprehensive metabolic panel    Lab Results  Component Value Date   CREATININE 0.79 09/13/2018   BUN 9 09/13/2018   NA 142 09/13/2018   K 4.1 09/13/2018   CL 105 09/13/2018   CO2 28 09/13/2018   Lab Results  Component Value Date   ALT 10 09/13/2018   AST 17 09/13/2018   ALKPHOS 83 09/13/2018   BILITOT 0.5 09/13/2018   Routine general  medical examination at a health care facility We discussed the importance of regular physical activity and healthy diet for prevention of chronic illness. Preventive guidelines reviewed. Vaccination up to date.She will get flu vaccine mid 10/2018 Ca++ and vit D supplementation to continue. Next CPE in a year.  Gastroesophageal reflux disease without esophagitis Problem is well controlled. Continue Pepcid 20 mg daily as needed. GERD precautions.  Vitamin D deficiency, unspecified No changes in current management, will follow labs done today and will give further recommendations accordingly.  -     VITAMIN D 25 Hydroxy (Vit-D Deficiency, Fractures) -     Comprehensive metabolic panel  Medicare annual wellness visit, subsequent We discussed the importance of staying active, physically and mentally, as well as the benefits of a healthy/balance diet. Low impact exercise that involve stretching and strengthing are ideal.  We discussed preventive screening for the next 5-10 years, summery of recommendations given in AVS. Fall prevention. DEXA will be arranged. Annual influenza vaccine.  Advance directives and end of life discussed, she doe snot have POA or living will,strongly recommended and information given on AVS.   Asymptomatic postmenopausal estrogen deficiency -     DEXAScan; Future     Return in 1 year (on 09/13/2019) for cpe.    Harlan Ervine G. Martinique, MD  [facility]. [place] office.

## 2018-09-13 NOTE — Patient Instructions (Signed)
  Tanya Swanson , Thank you for taking time to come for your Medicare Wellness Visit. I appreciate your ongoing commitment to your health goals. Please review the following plan we discussed and let me know if I can assist you in the future.   These are the goals we discussed: Goals   None     This is a list of the screening recommended for you and due dates:  Health Maintenance  Topic Date Due  . DEXA scan (bone density measurement)  02/14/2004  . Flu Shot  08/12/2018  . Tetanus Vaccine  01/13/2021  . Pneumonia vaccines  Completed   A few things to remember from today's visit:   Routine general medical examination at a health care facility  Gastroesophageal reflux disease without esophagitis  Vitamin D deficiency, unspecified - Plan: VITAMIN D 25 Hydroxy (Vit-D Deficiency, Fractures)  Medicare annual wellness visit, subsequent  Asymptomatic postmenopausal estrogen deficiency - Plan: DEXAScan  A few tips:  -As we age balance is not as good as it was, so there is a higher risks for falls. Please remove small rugs and furniture that is "in your way" and could increase the risk of falls. Stretching exercises may help with fall prevention: Yoga and Tai Chi are some examples. Low impact exercise is better, so you are not very achy the next day.  -Sun screen and avoidance of direct sun light recommended. Caution with dehydration, if working outdoors be sure to drink enough fluids.  - Some medications are not safe as we age, increases the risk of side effects and can potentially interact with other medication you are also taken;  including some of over the counter medications. Be sure to let me know when you start a new medication even if it is a dietary/vitamin supplement.   -Healthy diet low in red meet/animal fat and sugar + regular physical activity is recommended.   Screening schedule for the next 5-10 years:  Glaucoma screening/eye exam every 1-2 years.  Mammogram for  breast cancer screening annually.  Flu vaccine annually. Fall prevention   Advance directives:  Please see a lawyer and/or go to this website to help you with advanced directives and designating a health care power of attorney so that your wishes will be followed should you become too ill to make your own medical decisions.  [email or web address]

## 2018-09-15 ENCOUNTER — Encounter: Payer: Self-pay | Admitting: Family Medicine

## 2018-10-05 DIAGNOSIS — M79672 Pain in left foot: Secondary | ICD-10-CM | POA: Diagnosis not present

## 2018-10-05 DIAGNOSIS — D2372 Other benign neoplasm of skin of left lower limb, including hip: Secondary | ICD-10-CM | POA: Diagnosis not present

## 2018-10-10 DIAGNOSIS — R69 Illness, unspecified: Secondary | ICD-10-CM | POA: Diagnosis not present

## 2018-11-01 DIAGNOSIS — K5900 Constipation, unspecified: Secondary | ICD-10-CM | POA: Diagnosis not present

## 2018-11-01 DIAGNOSIS — Z809 Family history of malignant neoplasm, unspecified: Secondary | ICD-10-CM | POA: Diagnosis not present

## 2018-11-01 DIAGNOSIS — Z825 Family history of asthma and other chronic lower respiratory diseases: Secondary | ICD-10-CM | POA: Diagnosis not present

## 2018-11-01 DIAGNOSIS — J309 Allergic rhinitis, unspecified: Secondary | ICD-10-CM | POA: Diagnosis not present

## 2018-11-01 DIAGNOSIS — Z8249 Family history of ischemic heart disease and other diseases of the circulatory system: Secondary | ICD-10-CM | POA: Diagnosis not present

## 2018-11-01 DIAGNOSIS — Z880 Allergy status to penicillin: Secondary | ICD-10-CM | POA: Diagnosis not present

## 2018-11-01 DIAGNOSIS — M1990 Unspecified osteoarthritis, unspecified site: Secondary | ICD-10-CM | POA: Diagnosis not present

## 2018-11-01 DIAGNOSIS — K219 Gastro-esophageal reflux disease without esophagitis: Secondary | ICD-10-CM | POA: Diagnosis not present

## 2018-11-16 DIAGNOSIS — H5200 Hypermetropia, unspecified eye: Secondary | ICD-10-CM | POA: Diagnosis not present

## 2018-12-03 DIAGNOSIS — N3001 Acute cystitis with hematuria: Secondary | ICD-10-CM | POA: Diagnosis not present

## 2018-12-11 ENCOUNTER — Telehealth: Payer: Self-pay

## 2018-12-11 NOTE — Telephone Encounter (Signed)
Copied from [place] [phone]. Topic: General - Inquiry >> Dec 11, 2018  3:21 PM Richardo Priest, [place] wrote: Reason for CRM: Patient called in stating she would like to have another order for a UA to check if she still has blood in urine. Please advise.

## 2018-12-11 NOTE — Telephone Encounter (Signed)
Ok for order?  

## 2018-12-12 ENCOUNTER — Other Ambulatory Visit: Payer: Self-pay | Admitting: Family Medicine

## 2018-12-12 DIAGNOSIS — R3129 Other microscopic hematuria: Secondary | ICD-10-CM

## 2018-12-12 DIAGNOSIS — R350 Frequency of micturition: Secondary | ICD-10-CM | POA: Diagnosis not present

## 2018-12-12 NOTE — Telephone Encounter (Signed)
Order for UA was placed. Last UA I can see was done in 07/2016 and was negative for blood. Thanks, BJ

## 2018-12-28 DIAGNOSIS — R350 Frequency of micturition: Secondary | ICD-10-CM | POA: Diagnosis not present

## 2018-12-28 DIAGNOSIS — R109 Unspecified abdominal pain: Secondary | ICD-10-CM | POA: Diagnosis not present

## 2019-05-02 ENCOUNTER — Other Ambulatory Visit: Payer: Self-pay | Admitting: Family Medicine

## 2019-05-02 DIAGNOSIS — Z1231 Encounter for screening mammogram for malignant neoplasm of breast: Secondary | ICD-10-CM

## 2019-06-04 ENCOUNTER — Other Ambulatory Visit: Payer: Self-pay

## 2019-06-04 ENCOUNTER — Ambulatory Visit
Admission: RE | Admit: 2019-06-04 | Discharge: 2019-06-04 | Disposition: A | Discharge status: Home / Self Care | Payer: Medicare HMO | Source: Ambulatory Visit | Attending: Family Medicine | Admitting: Family Medicine

## 2019-06-04 DIAGNOSIS — Z1231 Encounter for screening mammogram for malignant neoplasm of breast: Secondary | ICD-10-CM

## 2019-07-24 ENCOUNTER — Other Ambulatory Visit: Payer: Self-pay

## 2019-07-24 ENCOUNTER — Ambulatory Visit: Payer: Medicare HMO | Admitting: Family Medicine

## 2019-07-24 ENCOUNTER — Encounter: Payer: Self-pay | Admitting: Family Medicine

## 2019-07-24 VITALS — BP 122/60 | HR 91 | Temp 98.8°F | Wt 166.4 lb

## 2019-07-24 DIAGNOSIS — R5383 Other fatigue: Secondary | ICD-10-CM | POA: Diagnosis not present

## 2019-07-24 NOTE — Progress Notes (Signed)
   Subjective:    Patient ID: Tanya Swanson, female    DOB: 10-27-1940, 79 y.o.   MRN: [id number]  HPI Here for generalized fatigue that she thinks is related to a tick bite she had 3 months ago. [place] and has been bitten by many ticks over the years. This last bite was on the left calf and she was able to pull it out. She was never ill around this time, no fever or rash or body aches or headache. She takes no prescription medications but she does take Allertec frequently for allergy symptoms like itchy eyes or sneezing.    Review of Systems  Constitutional: Positive for fatigue.  Respiratory: Negative.   Cardiovascular: Negative.   Gastrointestinal: Negative.   Genitourinary: Negative.   Neurological: Negative.        Objective:   Physical Exam Constitutional:      Appearance: Normal appearance.  Cardiovascular:     Rate and Rhythm: Normal rate and regular rhythm.     Pulses: Normal pulses.     Heart sounds: Normal heart sounds.  Pulmonary:     Effort: Pulmonary effort is normal.     Breath sounds: Normal breath sounds.  Skin:    Comments: There is a pink macular spot on the left calf   Neurological:     General: No focal deficit present.     Mental Status: She is alert and oriented to person, place, and time.           Assessment & Plan:  Fatigue of uncertain etiology. I told her I doubt this is related in any way to the tick bite, but we will do some testing to make sure. A likely explanation is she could be having side effects from the Alleretec, so she agreed to stop taking this at least for now.  Alysia Penna, MD

## 2019-07-25 LAB — CBC WITH DIFFERENTIAL/PLATELET
Absolute Monocytes: 571 cells/uL (ref 200–950)
Basophils Absolute: 20 cells/uL (ref 0–200)
Basophils Relative: 0.3 %
Eosinophils Absolute: 476 cells/uL (ref 15–500)
Eosinophils Relative: 7 %
HCT: 38.4 % (ref 35.0–45.0)
Hemoglobin: 12.8 g/dL (ref 11.7–15.5)
Lymphs Abs: 1605 cells/uL (ref 850–3900)
MCH: 29.9 pg (ref 27.0–33.0)
MCHC: 33.3 g/dL (ref 32.0–36.0)
MCV: 89.7 fL (ref 80.0–100.0)
MPV: 9.7 fL (ref 7.5–12.5)
Monocytes Relative: 8.4 %
Neutro Abs: 4128 cells/uL (ref 1500–7800)
Neutrophils Relative %: 60.7 %
Platelets: 301 10*3/uL (ref 140–400)
RBC: 4.28 10*6/uL (ref 3.80–5.10)
RDW: 12.1 % (ref 11.0–15.0)
Total Lymphocyte: 23.6 %
WBC: 6.8 10*3/uL (ref 3.8–10.8)

## 2019-07-25 LAB — BASIC METABOLIC PANEL
BUN: 11 mg/dL (ref 7–25)
CO2: 27 mmol/L (ref 20–32)
Calcium: 9.6 mg/dL (ref 8.6–10.4)
Chloride: 104 mmol/L (ref 98–110)
Creat: 0.81 mg/dL (ref 0.60–0.88)
Glucose, Bld: 96 mg/dL (ref 65–99)
Potassium: 4.7 mmol/L (ref 3.5–5.3)
Sodium: 141 mmol/L (ref 135–146)

## 2019-07-25 LAB — HEPATIC FUNCTION PANEL
AG Ratio: 1.6 (calc) (ref 1.0–2.5)
ALT: 13 U/L (ref 6–29)
AST: 18 U/L (ref 10–35)
Albumin: 4.1 g/dL (ref 3.6–5.1)
Alkaline phosphatase (APISO): 88 U/L (ref 37–153)
Bilirubin, Direct: 0.1 mg/dL (ref 0.0–0.2)
Globulin: 2.6 g/dL (calc) (ref 1.9–3.7)
Indirect Bilirubin: 0.3 mg/dL (calc) (ref 0.2–1.2)
Total Bilirubin: 0.4 mg/dL (ref 0.2–1.2)
Total Protein: 6.7 g/dL (ref 6.1–8.1)

## 2019-07-25 LAB — TSH: TSH: 1.26 mIU/L (ref 0.40–4.50)

## 2019-07-25 LAB — B. BURGDORFI ANTIBODIES: B burgdorferi Ab IgG+IgM: <0.9 index

## 2019-09-18 ENCOUNTER — Other Ambulatory Visit: Payer: Self-pay

## 2019-09-18 ENCOUNTER — Telehealth: Payer: Self-pay | Admitting: Family Medicine

## 2019-09-18 ENCOUNTER — Encounter: Payer: Self-pay | Admitting: Family Medicine

## 2019-09-18 ENCOUNTER — Ambulatory Visit (INDEPENDENT_AMBULATORY_CARE_PROVIDER_SITE_OTHER): Payer: Medicare HMO | Admitting: Family Medicine

## 2019-09-18 VITALS — BP 124/80 | HR 79 | Temp 98.5°F | Resp 16 | Ht 67.0 in | Wt 165.0 lb

## 2019-09-18 DIAGNOSIS — Z0000 Encounter for general adult medical examination without abnormal findings: Secondary | ICD-10-CM | POA: Diagnosis not present

## 2019-09-18 DIAGNOSIS — Z0189 Encounter for other specified special examinations: Secondary | ICD-10-CM | POA: Diagnosis not present

## 2019-09-18 DIAGNOSIS — R3129 Other microscopic hematuria: Secondary | ICD-10-CM | POA: Diagnosis not present

## 2019-09-18 DIAGNOSIS — H1031 Unspecified acute conjunctivitis, right eye: Secondary | ICD-10-CM | POA: Diagnosis not present

## 2019-09-18 MED ORDER — NEOMYCIN-POLYMYXIN-HC 3.5-10000-1 OP SUSP: 2.0000 [drp] | Freq: Three times a day (TID) | OPHTHALMIC | 0 refills | Status: DC

## 2019-09-18 NOTE — Telephone Encounter (Signed)
Pharmacy called and stated they do not have the eye drops Cortisporin. They said they will check nearby pharmacies but wonders if there is another brand the pt could try?   Phone: [phone]

## 2019-09-18 NOTE — Progress Notes (Signed)
HPI: [name] Plan [place] is a 79 y.o. female, who is here today for her routine physical.  Last CPE: 09/13/19 No new problems sine her last visit. Regular exercise 3 or more time per week: She walks 5-6 times per week. Following a healthy diet: yes, trying to do so. She lives with her husband.  She still works part time, 4-5 times per week.  No falls in the past year and no depression like symptoms.  Chronic medical problems: Vit D deficiency,GERD, nephrolithiasis,constipation,and OA among some.  Immunization History  Administered Date(s) Administered  . Influenza, High Dose Seasonal PF 10/12/2017, 10/10/2018  . Influenza,inj,Quad PF,6+ Mos 10/13/2015  . Pneumococcal Conjugate-13 04/09/2016  . Pneumococcal Polysaccharide-23 01/12/2011  . Tdap 01/14/2011  . Zoster 01/12/2007   Mammogram: 06/04/19 Bi-Rads 1 Colonoscopy: 06/17/17. DEXA: 02/2012.  Concerns today:  Memory: Forgetting peoples names sometimes. This is not affecting her job performance or social interaction. Family has not noted problems.  She was seen 07/24/19 for urinary symptoms. She was on abx for UTI x 2 because persistent symptoms. Symptoms have resolved. She would like her urine check today.  Microscopic hematuria/nephrolithiasis:She sees urologist at least twice this year and last time she was told to follow as needed.  She takes Tylenol x 2 at bedtime for back pain. She is c/o pain , mld, when in bed, burning sensation. It does not happen every night.  She would like to have her cholesterol check. No hx of HLD.  Right erythema: 3 days ago (Saturday) while working on her garden, she felt like something went into her right eye. Denies having any eye pain or changes in vision. Negative for purulent drainage or photophobia.  + Pruritus and conjunctival erythema. She has not tried OTC medication. Problem seems to be stable.  Review of Systems  Constitutional: Negative for activity change,  appetite change and fever.  HENT: Negative for hearing loss, mouth sores, sore throat and trouble swallowing.   Eyes: Negative for redness and visual disturbance.  Respiratory: Negative for cough, shortness of breath and wheezing.   Cardiovascular: Negative for chest pain and leg swelling.  Gastrointestinal: Negative for abdominal pain, nausea and vomiting.       No changes in bowel habits.  Endocrine: Negative for cold intolerance, heat intolerance, polydipsia, polyphagia and polyuria.  Genitourinary: Negative for decreased urine volume, dysuria and hematuria.  Musculoskeletal: Positive for arthralgias. Negative for gait problem and myalgias.  Skin: Negative for color change and rash.  Allergic/Immunologic: Positive for environmental allergies.  Neurological: Negative for syncope, weakness and headaches.  Hematological: Negative for adenopathy. Does not bruise/bleed easily.  Psychiatric/Behavioral: Negative for confusion and sleep disturbance.  All other systems reviewed and are negative.  Current Outpatient Medications on File Prior to Visit  Medication Sig Dispense Refill  . Acetaminophen (TYLENOL PO) Take 500 mg by mouth as needed.    . Biotin (SUPER BIOTIN) 5 MG TABS Take by mouth.    . Calcium-Vitamin D-Vitamin K 595-638-75 MG-UNT-MCG CHEW Chew by mouth.    . Cholecalciferol (VITAMIN D3) 50 MCG (2000 UT) capsule Take by mouth.    . Famotidine (PEPCID PO) Take by mouth daily.    . polyethylene glycol (MIRALAX / GLYCOLAX) packet Take 17 g by mouth daily as needed. For constipation.    . cetirizine (ZYRTEC) 5 MG tablet Take by mouth.     No current facility-administered medications on file prior to visit.   Past Medical History:  Diagnosis Date  . Acid reflux   .  Allergy   . 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 Blood in stool   . Chicken pox   . Chronic kidney disease    UTI and kidneystones  . Constipation    unknown urology procedure 2006  . Hiatal hernia   . Kidney stones   .  Pneumonia 1968  . Urinary tract infection    Past Surgical History:  Procedure Laterality Date  . CATARACT EXTRACTION W/PHACO Right 02/24/2015   Procedure: CATARACT EXTRACTION PHACO AND INTRAOCULAR LENS PLACEMENT RIGHT EYE CDE=8.01;  Surgeon: Tonny Branch, MD;  Location: [facility];  Service: Ophthalmology;  Laterality: Right;  . CATARACT EXTRACTION W/PHACO Left 04/03/2015   Procedure: CATARACT EXTRACTION PHACO AND INTRAOCULAR LENS PLACEMENT (IOC);  Surgeon: Tonny Branch, MD;  Location: [facility];  Service: Ophthalmology;  Laterality: Left;  CDE:11.74  . DILATION AND CURETTAGE OF UTERUS    . LITHOTRIPSY    . TONSILLECTOMY  1951    Allergies  Allergen Reactions  . Penicillins Other (See Comments)    Has patient had a PCN reaction causing immediate rash, facial/tongue/throat swelling, SOB or lightheadedness with hypotension: Yes Has patient had a PCN reaction causing severe rash involving mucus membranes or skin necrosis: No Has patient had a PCN reaction that required hospitalization No Has patient had a PCN reaction occurring within the last 10 years: No If all of the above answers are "NO", then may proceed with Cephalosporin use. Passed out Has patient had a PCN reaction causing immediate rash, facial/tongue/throat swelling, SOB or lightheadedness with hypotension: Yes Has patient had a PCN reaction causing severe rash involving mucus membranes or skin necrosis: No Has patient had a PCN reaction that required hospitalization No Has patient had a PCN reaction occurring within the last 10 years: No If all of the above answers are "NO", then may proceed with Cephalosporin use. Passed out fainted Has patient had a PCN reaction causing immediate rash, facial/tongue/throat swelling, SOB or lightheadedness with hypotension: Yes Has patient had a PCN reaction causing severe rash involving mucus membranes or skin necrosis: No Has patient had a PCN reaction that required hospitalization No Has patient  had a PCN reaction occurring within the last 10 years: No If all of the above answers are "NO", then may proceed with Cephalosporin use. Passed out fainted Has patient had a PCN reaction causing immediate rash, facial/tongue/throat swelling, SOB or lightheadedness with hypotension: Yes Has patient had a PCN reaction causing severe rash involving mucus membranes or skin necrosis: No Has patient had a PCN reaction that required hospitalization No Has patient had a PCN reaction occurring within the last 10 years: No If all of the above answers are "NO", then may proceed with Cephalosporin use. Passed out Has patient had a PCN reaction causing immediate rash, facial/tongue/throat swelling, SOB or lightheadedness with hypotension: Yes Has patient had a PCN reaction causing severe rash involving mucus membranes or skin necrosis: No Has patient had a PCN reaction that required hospitalization No Has patient had a PCN reaction occurring within the last 10 years: No If all of the above answers are "NO", then may proceed with Cephalosporin use. Passed out fainted    Family History  Problem Relation Age of Onset  . Breast cancer Maternal Aunt   . Arthritis Mother   . Hypertension Mother   . Arthritis Father   . Hypertension Father     Social History   Socioeconomic History  . Marital status: Married    Spouse name: Not on  file  . Number of children: Not on file  . Years of education: Not on file  . Highest education level: Not on file  Occupational History  . Not on file  Tobacco Use  . Smoking status: Never Smoker  . Smokeless tobacco: Never Used  Substance and Sexual Activity  . Alcohol use: No  . Drug use: No  . Sexual activity: Not on file  Other Topics Concern  . Not on file  Social History Narrative  . Not on file   Social Determinants of Health   Financial Resource Strain:   . Difficulty of Paying Living Expenses: Not on file  Food Insecurity:   . Worried About  Charity fundraiser in the Last Year: Not on file  . Ran Out of Food in the Last Year: Not on file  Transportation Needs:   . Lack of Transportation (Medical): Not on file  . Lack of Transportation (Non-Medical): Not on file  Physical Activity:   . Days of Exercise per Week: Not on file  . Minutes of Exercise per Session: Not on file  Stress:   . Feeling of Stress : Not on file  Social Connections:   . Frequency of Communication with Friends and Family: Not on file  . Frequency of Social Gatherings with Friends and Family: Not on file  . Attends Religious Services: Not on file  . Active Member of Clubs or Organizations: Not on file  . Attends Archivist Meetings: Not on file  . Marital Status: Not on file    Vitals:   09/18/19 1124  BP: 124/80  Pulse: 79  Resp: 16  Temp: 98.5 F (36.9 C)  SpO2: 96%   Body mass index is 25.84 kg/m.  Wt Readings from Last 3 Encounters:  09/18/19 165 lb (74.8 kg)  07/24/19 166 lb 6.4 oz (75.5 kg)  09/13/18 163 lb 8 oz (74.2 kg)   Physical Exam Vitals and nursing note reviewed.  Constitutional:      General: She is not in acute distress.    Appearance: She is well-developed.  HENT:     Head: Normocephalic and atraumatic.     Right Ear: Hearing, tympanic membrane, ear canal and external ear normal.     Left Ear: Hearing, tympanic membrane, ear canal and external ear normal.     Mouth/Throat:     Pharynx: Uvula midline.  Eyes:     General: Lids are normal. Lids are everted, no foreign bodies appreciated.        Right eye: No foreign body or discharge.     Extraocular Movements: Extraocular movements intact.     Conjunctiva/sclera:     Right eye: Right conjunctiva is injected. Hemorrhage present. No exudate.    Pupils: Pupils are equal, round, and reactive to light.     Comments: No photophobia.  Neck:     Trachea: No tracheal deviation.  Cardiovascular:     Rate and Rhythm: Normal rate and regular rhythm.     Pulses:            Dorsalis pedis pulses are 2+ on the right side and 2+ on the left side.     Heart sounds: No murmur heard.   Pulmonary:     Effort: Pulmonary effort is normal. No respiratory distress.     Breath sounds: Normal breath sounds.  Abdominal:     Palpations: Abdomen is soft. There is no hepatomegaly or mass.     Tenderness: There is  no abdominal tenderness.  Musculoskeletal:     Comments: No signs of synovitis appreciated.  Lymphadenopathy:     Cervical: No cervical adenopathy.  Skin:    General: Skin is warm.     Findings: No erythema or rash.  Neurological:     Mental Status: She is alert and oriented to person, place, and time.     Cranial Nerves: No cranial nerve deficit.     Coordination: Coordination normal.     Gait: Gait normal.     Deep Tendon Reflexes:     Reflex Scores:      Bicep reflexes are 2+ on the right side and 2+ on the left side.      Patellar reflexes are 2+ on the right side and 2+ on the left side. Psychiatric:        Mood and Affect: Mood and affect normal.     Comments: Well groomed, good eye contact.   ASSESSMENT AND PLAN:  Ms. Tanya Swanson was here today annual physical examination and to address some concerns.  Orders Placed This Encounter  Procedures  . MICROSCOPIC MESSAGE  . Urinalysis, Routine w reflex microscopic  . Lipid panel   Lab Results  Component Value Date   CHOL 177 09/18/2019   HDL 58 09/18/2019   LDLCALC 96 09/18/2019   TRIG 125 09/18/2019   CHOLHDL 3.1 09/18/2019    Routine general medical examination at a health care facility We discussed the importance of regular physical activity and healthy diet for prevention of chronic illness and/or complications. Preventive guidelines reviewed. Vaccination up to date. She is planning on getting flu vaccine in 10/2019.  Ca++ and vit D supplementation recommended. Next CPE in a year.  Microscopic hematuria Follows with urologist prn. Currently she is not having  urinary symptoms. U/A ordered as requested.  Patient request for diagnostic testing -     Lipid panel  Acute conjunctivitis of right eye, unspecified acute conjunctivitis type Subconjunctival hemorrhage. Examination today some snot suggest a serious process. Recommend combination of abx and steroid to help with inflammation.Educated about side effects of ophthalmic steroids and instructed to discard med after 7 days. Clearly instructed about warning signs. F/U with her eye care provider if not greatly improved in 48-72 hours.    Hearing Screening   _0  _1  _2  _3  _4  _5  _6  _7  _8   Right ear:           Left ear:             Visual Acuity Screening   Right eye Left eye Both eyes  Without correction: _9  With correction:      -     Neomycin-polymyxin-hydrocortisone (CORTISPORIN) 3.5-10000-1 ophthalmic suspension; Place 2 drops into the right eye 3 (three) times daily for 7 days.   Return in 1 year (on 09/17/2020) for CPE in a year. She needs AWV done.   Taavi Hoose G. [place], MD  [facility]. [place] office.   A few things to remember from today's visit:   Acute conjunctivitis of right eye, unspecified acute conjunctivitis type  Microscopic hematuria - Plan: Urinalysis, Routine w reflex microscopic  Patient request for diagnostic testing - Plan: Lipid panel  Routine general medical examination at a health care facility  If you need refills please call your pharmacy. Do not use My Chart to request refills or for acute issues that need immediate attention.    Please be sure medication list is accurate. If a new problem  present, please set up appointment sooner than planned today.  A few tips:  -As we age balance is not as good as it was, so there is a higher risks for falls. Please remove small rugs and furniture that is "in your way" and could increase the risk of falls. Stretching exercises may help with fall  prevention: Yoga and Tai Chi are some examples. Low impact exercise is better, so you are not very achy the next day.  -Sun screen and avoidance of direct sun light recommended. Caution with dehydration, if working outdoors be sure to drink enough fluids.  - Some medications are not safe as we age, increases the risk of side effects and can potentially interact with other medication you are also taken;  including some of over the counter medications. Be sure to let me know when you start a new medication even if it is a dietary/vitamin supplement.   -Healthy diet low in red meet/animal fat and sugar + regular physical activity is recommended.

## 2019-09-18 NOTE — Patient Instructions (Addendum)
A few things to remember from today's visit:   Acute conjunctivitis of right eye, unspecified acute conjunctivitis type  Microscopic hematuria - Plan: Urinalysis, Routine w reflex microscopic  Patient request for diagnostic testing - Plan: Lipid panel  Routine general medical examination at a health care facility  If you need refills please call your pharmacy. Do not use My Chart to request refills or for acute issues that need immediate attention.    Please be sure medication list is accurate. If a new problem present, please set up appointment sooner than planned today.  A few tips:  -As we age balance is not as good as it was, so there is a higher risks for falls. Please remove small rugs and furniture that is "in your way" and could increase the risk of falls. Stretching exercises may help with fall prevention: Yoga and Tai Chi are some examples. Low impact exercise is better, so you are not very achy the next day.  -Sun screen and avoidance of direct sun light recommended. Caution with dehydration, if working outdoors be sure to drink enough fluids.  - Some medications are not safe as we age, increases the risk of side effects and can potentially interact with other medication you are also taken;  including some of over the counter medications. Be sure to let me know when you start a new medication even if it is a dietary/vitamin supplement.   -Healthy diet low in red meet/animal fat and sugar + regular physical activity is recommended.

## 2019-09-19 LAB — URINALYSIS, ROUTINE W REFLEX MICROSCOPIC
Bacteria, UA: NONE SEEN /HPF
Bilirubin Urine: NEGATIVE
Glucose, UA: NEGATIVE
Hgb urine dipstick: NEGATIVE
Hyaline Cast: NONE SEEN /LPF
Ketones, ur: NEGATIVE
Nitrite: NEGATIVE
Protein, ur: NEGATIVE
Specific Gravity, Urine: 1.018 (ref 1.001–1.03)
Squamous Epithelial / HPF: NONE SEEN /HPF (ref <=5)
pH: 6.5 (ref 5.0–8.0)

## 2019-09-19 LAB — LIPID PANEL
Cholesterol: 177 mg/dL (ref <200)
HDL: 58 mg/dL (ref >=50)
LDL Cholesterol (Calc): 96 mg/dL (calc)
Non-HDL Cholesterol (Calc): 119 mg/dL (calc) (ref <130)
Total CHOL/HDL Ratio: 3.1 (calc) (ref <5.0)
Triglycerides: 125 mg/dL (ref <150)

## 2019-09-19 MED ORDER — TOBRAMYCIN-DEXAMETHASONE 0.3-0.1 % OP SUSP: 1.0000 [drp] | Freq: Three times a day (TID) | OPHTHALMIC | 0 refills | Status: AC

## 2019-09-19 NOTE — Telephone Encounter (Signed)
Rx sent in

## 2019-09-19 NOTE — Telephone Encounter (Signed)
Tobradex is another options, 1 drop q 8 hours for 7 days. Thanks, BJ

## 2019-09-20 ENCOUNTER — Telehealth: Payer: Self-pay | Admitting: Family Medicine

## 2019-09-20 NOTE — Telephone Encounter (Signed)
Patient called for her results.  Please advise

## 2019-09-21 NOTE — Telephone Encounter (Signed)
Results viewed & provider's comments reviewed by patient.

## 2019-09-21 NOTE — Telephone Encounter (Signed)
Waiting for pcp to review lab results.

## 2019-09-26 ENCOUNTER — Other Ambulatory Visit: Payer: Self-pay

## 2019-09-26 ENCOUNTER — Ambulatory Visit (HOSPITAL_COMMUNITY)
Admission: RE | Admit: 2019-09-26 | Discharge: 2019-09-26 | Disposition: A | Discharge status: Home / Self Care | Payer: Medicare HMO | Source: Ambulatory Visit | Attending: Family Medicine | Admitting: Family Medicine

## 2019-09-26 DIAGNOSIS — Z780 Asymptomatic menopausal state: Secondary | ICD-10-CM | POA: Insufficient documentation

## 2019-10-04 ENCOUNTER — Telehealth: Payer: Self-pay | Admitting: Family Medicine

## 2019-10-04 NOTE — Telephone Encounter (Signed)
Pt is calling to get her bone density results she is aware that the provider has not resulted them but after she does someone in the office will give her a call to give her those results.

## 2019-10-05 NOTE — Telephone Encounter (Signed)
Have you gotten these bone density results back yet?

## 2019-10-08 ENCOUNTER — Telehealth: Payer: Self-pay | Admitting: Family Medicine

## 2019-10-08 NOTE — Telephone Encounter (Signed)
Left message for patient to call back and schedule Medicare Annual Wellness Visit (AWV) either virtually or in office.  Last AWV 09/13/2018; please schedule at anytime with [facility] Nurse Health Advisor 2.  This should be a 45 minute visit.

## 2019-10-09 ENCOUNTER — Encounter: Payer: Self-pay | Admitting: Family Medicine

## 2019-10-09 DIAGNOSIS — M816 Localized osteoporosis [Lequesne]: Secondary | ICD-10-CM | POA: Insufficient documentation

## 2019-10-09 NOTE — Telephone Encounter (Signed)
See result note.  

## 2019-10-15 ENCOUNTER — Other Ambulatory Visit: Payer: Self-pay

## 2019-10-15 MED ORDER — ALENDRONATE SODIUM 70 MG PO TABS: 70.0000 mg | ORAL_TABLET | ORAL | 11 refills | Status: DC

## 2019-10-16 ENCOUNTER — Telehealth: Payer: Self-pay

## 2019-10-16 NOTE — Telephone Encounter (Signed)
Pt has tried Fosamax and Actonel in the past but they caused issues with her esophagus. She is taking Vitamin D & Calcium. Is there anything else she can try?  [phone]

## 2019-10-17 NOTE — Telephone Encounter (Signed)
Pt is interested in prolia. Can you start the verification process? Thanks!

## 2019-10-17 NOTE — Telephone Encounter (Signed)
Next step would be Prolia q 6 months. This med may need PA. Thanks, BJ

## 2019-11-13 NOTE — Telephone Encounter (Signed)
Verification of benefits has been started 11/13/19.

## 2019-11-20 NOTE — Telephone Encounter (Signed)
No cost for patient's Prolia Okay to schedule

## 2019-11-22 NOTE — Telephone Encounter (Signed)
Patient scheduled for 11/23/19.

## 2019-11-23 ENCOUNTER — Other Ambulatory Visit: Payer: Self-pay

## 2019-11-23 ENCOUNTER — Ambulatory Visit (INDEPENDENT_AMBULATORY_CARE_PROVIDER_SITE_OTHER): Payer: Medicare HMO

## 2019-11-23 DIAGNOSIS — M810 Age-related osteoporosis without current pathological fracture: Secondary | ICD-10-CM

## 2019-11-23 MED ORDER — DENOSUMAB 60 MG/ML ~~LOC~~ SOSY
60.0000 mg | PREFILLED_SYRINGE | Freq: Once | SUBCUTANEOUS | Status: AC
  Administered 2019-11-23: 60 mg via SUBCUTANEOUS

## 2019-11-23 NOTE — Progress Notes (Signed)
Patient came into the office for her first Prolia injection. Injection given sub q in the right upper back arm. Pt tolerated injection well. Pt stayed in office for 10-15 minutes after receiving vaccine. No side effects reported.

## 2020-02-21 ENCOUNTER — Telehealth: Payer: Self-pay | Admitting: Family Medicine

## 2020-02-21 NOTE — Telephone Encounter (Signed)
Left message for patient to call back and schedule Medicare Annual Wellness Visit (AWV) either virtually or in office. No detailed message left     Last AWV 09/13/2018  please schedule at anytime with [facility] Nurse Health Advisor 1 or 2   This should be a 45 minute visit.

## 2020-03-03 NOTE — Telephone Encounter (Signed)
Pt called and has been scheduled

## 2020-03-04 ENCOUNTER — Telehealth: Payer: Self-pay | Admitting: Family Medicine

## 2020-03-04 NOTE — Telephone Encounter (Signed)
Left message asking patient to call office,  Please reschedule 03/20/20 appointment.  We have [facility] meeting

## 2020-03-20 ENCOUNTER — Ambulatory Visit: Payer: Medicare HMO

## 2020-03-24 NOTE — Progress Notes (Unsigned)
Subjective:   Tanya Swanson is a 80 y.o. female who presents for Medicare Annual (Subsequent) preventive examination.  Review of Systems    N/A        Objective:    There were no vitals filed for this visit. There is no height or weight on file to calculate BMI.  Advanced Directives 04/03/2015 02/24/2015 03/14/2014  Does Patient Have a Medical Advance Directive? No No No  Would patient like information on creating a medical advance directive? No - patient declined information Yes - Educational materials given Yes - Educational materials given    Current Medications (verified) Outpatient Encounter Medications as of 03/25/2020  Medication Sig  . Acetaminophen (TYLENOL PO) Take 500 mg by mouth as needed.  . Biotin (SUPER BIOTIN) 5 MG TABS Take by mouth.  . Calcium-Vitamin D-Vitamin K [id number] MG-UNT-MCG CHEW Chew by mouth.  . cetirizine (ZYRTEC) 5 MG tablet Take by mouth.  . Cholecalciferol (VITAMIN D3) 50 MCG (2000 UT) capsule Take by mouth.  . Famotidine (PEPCID PO) Take by mouth daily.  . polyethylene glycol (MIRALAX / GLYCOLAX) packet Take 17 g by mouth daily as needed. For constipation.   No facility-administered encounter medications on file as of 03/25/2020.    Allergies (verified) Penicillins   History: Past Medical History:  Diagnosis Date  . Acid reflux   . Allergy   . 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 Blood in stool   . Chicken pox   . Chronic kidney disease    UTI and kidneystones  . Constipation    unknown urology procedure 2006  . Hiatal hernia   . Kidney stones   . Pneumonia 1968  . Urinary tract infection    Past Surgical History:  Procedure Laterality Date  . CATARACT EXTRACTION W/PHACO Right 02/24/2015   Procedure: CATARACT EXTRACTION PHACO AND INTRAOCULAR LENS PLACEMENT RIGHT EYE CDE=8.01;  Surgeon: Tonny Branch, MD;  Location: [facility];  Service: Ophthalmology;  Laterality: Right;  . CATARACT EXTRACTION W/PHACO Left 04/03/2015   Procedure:  CATARACT EXTRACTION PHACO AND INTRAOCULAR LENS PLACEMENT (IOC);  Surgeon: Tonny Branch, MD;  Location: [facility];  Service: Ophthalmology;  Laterality: Left;  CDE:11.74  . DILATION AND CURETTAGE OF UTERUS    . LITHOTRIPSY    . TONSILLECTOMY  1951   Family History  Problem Relation Age of Onset  . Breast cancer Maternal Aunt   . Arthritis Mother   . Hypertension Mother   . Arthritis Father   . Hypertension Father    Social History   Socioeconomic History  . Marital status: Married    Spouse name: Not on file  . Number of children: Not on file  . Years of education: Not on file  . Highest education level: Not on file  Occupational History  . Not on file  Tobacco Use  . Smoking status: Never Smoker  . Smokeless tobacco: Never Used  Substance and Sexual Activity  . Alcohol use: No  . Drug use: No  . Sexual activity: Not on file  Other Topics Concern  . Not on file  Social History Narrative  . Not on file   Social Determinants of Health   Financial Resource Strain: Not on file  Food Insecurity: Not on file  Transportation Needs: Not on file  Physical Activity: Not on file  Stress: Not on file  Social Connections: Not on file    Tobacco Counseling Counseling given: Not Answered   Clinical Intake:  Diabetic? No          Activities of Daily Living In your present state of health, do you have any difficulty performing the following activities: 09/18/2019  Hearing? N  Vision? N  Difficulty concentrating or making decisions? N  Walking or climbing stairs? N  Dressing or bathing? N  Doing errands, shopping? N  Some recent data might be hidden    Patient Care Team: Martinique, Betty G, MD as PCP - General (Family Medicine)  Indicate any recent Medical Services you may have received from other than [facility] providers in the past year (date may be approximate).     Assessment:   This is a routine wellness examination for Tanya Swanson.  Hearing/Vision  screen No exam data present  Dietary issues and exercise activities discussed:    Goals   None    Depression Screen PHQ 2/9 Scores 09/18/2019 04/12/2017  PHQ - 2 Score 0 0    Fall Risk Fall Risk  09/18/2019 09/13/2018  Falls in the past year? 0 0  Number falls in past yr: - 0  Injury with Fall? - 0  Follow up - Education provided    [place]:  Any stairs in or around the home? {YES/NO:21197} If so, are there any without handrails? No  Home free of loose throw rugs in walkways, pet beds, electrical cords, etc? Yes  Adequate lighting in your home to reduce risk of falls? Yes   ASSISTIVE DEVICES UTILIZED TO PREVENT FALLS:  Life alert? {YES/NO:21197} Use of a cane, walker or w/c? {YES/NO:21197} Grab bars in the bathroom? {YES/NO:21197} Shower chair or bench in shower? {YES/NO:21197} Elevated toilet seat or a handicapped toilet? {YES/NO:21197}    Cognitive Function:        Immunizations Immunization History  Administered Date(s) Administered  . Influenza, High Dose Seasonal PF 10/12/2017, 10/10/2018  . Influenza,inj,Quad PF,6+ Mos 10/13/2015  . Pneumococcal Conjugate-13 04/09/2016  . Pneumococcal Polysaccharide-23 01/12/2011  . Tdap 01/14/2011  . Zoster 01/12/2007    TDAP status: Up to date  {Flu Vaccine status:2101806}  Pneumococcal vaccine status: Up to date  {Covid-19 vaccine status:2101808}  Qualifies for Shingles Vaccine? Yes   Zostavax completed Yes   Shingrix Completed?: No.    Education has been provided regarding the importance of this vaccine. Patient has been advised to call insurance company to determine out of pocket expense if they have not yet received this vaccine. Advised may also receive vaccine at local pharmacy or Health Dept. Verbalized acceptance and understanding.  Screening Tests Health Maintenance  Topic Date Due  . COVID-19 Vaccine (1) Never done  . INFLUENZA VACCINE  08/12/2019  . TETANUS/TDAP   01/13/2021  . DEXA SCAN  Completed  . PNA vac Low Risk Adult  Completed  . HPV VACCINES  Aged Out    Health Maintenance  Health Maintenance Due  Topic Date Due  . COVID-19 Vaccine (1) Never done  . INFLUENZA VACCINE  08/12/2019    {Colorectal cancer screening:2101809}  {Mammogram status:21018020}  {Bone Density status:21018021}  Lung Cancer Screening: (Low Dose CT Chest recommended if Age 51-80 years, 30 pack-year currently smoking OR have quit w/in 15years.) {DOES NOT does:27190::"does not"} qualify.   Lung Cancer Screening Referral: ***  Additional Screening:  Hepatitis C Screening: does not qualify;   Vision Screening: Recommended annual ophthalmology exams for early detection of glaucoma and other disorders of the eye. Is the patient up to date with their annual eye exam?  {YES/NO:21197} Who  is the provider or what is the name of the office in which the patient attends annual eye exams? *** If pt is not established with a provider, would they like to be referred to a provider to establish care? {YES/NO:21197}.   Dental Screening: Recommended annual dental exams for proper oral hygiene  Community Resource Referral / Chronic Care Management: CRR required this visit?  No   CCM required this visit?  No      Plan:     I have personally reviewed and noted the following in the patient's chart:   . Medical and social history . Use of alcohol, tobacco or illicit drugs  . Current medications and supplements . Functional ability and status . Nutritional status . Physical activity . Advanced directives . List of other physicians . Hospitalizations, surgeries, and ER visits in previous 12 months . Vitals . Screenings to include cognitive, depression, and falls . Referrals and appointments  In addition, I have reviewed and discussed with patient certain preventive protocols, quality metrics, and best practice recommendations. A written personalized care plan for  preventive services as well as general preventive health recommendations were provided to patient.     Ofilia Neas, LPN   10/04/4142   Nurse Notes: None

## 2020-03-25 ENCOUNTER — Ambulatory Visit: Payer: Self-pay

## 2020-03-25 DIAGNOSIS — Z0000 Encounter for general adult medical examination without abnormal findings: Secondary | ICD-10-CM

## 2020-04-25 ENCOUNTER — Other Ambulatory Visit: Payer: Self-pay | Admitting: Family Medicine

## 2020-04-25 DIAGNOSIS — Z1231 Encounter for screening mammogram for malignant neoplasm of breast: Secondary | ICD-10-CM

## 2020-05-23 ENCOUNTER — Telehealth: Payer: Self-pay

## 2020-05-23 NOTE — Telephone Encounter (Signed)
$  0 cost for Prolia, $10 admin fee. Prior authorization has been approved from 05/22/20-05/22/2021.  Okay to schedule patient. I left a message with patient's husband for her to return a call to schedule her injection.

## 2020-05-26 ENCOUNTER — Telehealth: Payer: Self-pay | Admitting: Family Medicine

## 2020-05-26 NOTE — Telephone Encounter (Signed)
Patient is calling and stated that she received a letter in the mail for the 2nd prolia vaccine being approved. Is this ok to schedule, please advise. CB is [phone]

## 2020-05-26 NOTE — Telephone Encounter (Signed)
Okay to schedule, thank you!

## 2020-06-06 ENCOUNTER — Ambulatory Visit (INDEPENDENT_AMBULATORY_CARE_PROVIDER_SITE_OTHER): Payer: Medicare HMO

## 2020-06-06 ENCOUNTER — Ambulatory Visit: Payer: Medicare HMO

## 2020-06-06 ENCOUNTER — Other Ambulatory Visit: Payer: Self-pay

## 2020-06-06 DIAGNOSIS — M810 Age-related osteoporosis without current pathological fracture: Secondary | ICD-10-CM

## 2020-06-06 MED ORDER — DENOSUMAB 60 MG/ML ~~LOC~~ SOSY
60.0000 mg | PREFILLED_SYRINGE | Freq: Once | SUBCUTANEOUS | Status: AC
  Administered 2020-06-06: 60 mg via SUBCUTANEOUS

## 2020-06-06 NOTE — Progress Notes (Signed)
Per orders of Dr. Martinique, injection of Prolia 60 mg given by Kadyn Guild L Fabiola Mudgett. Patient tolerated injection well.

## 2020-06-17 ENCOUNTER — Ambulatory Visit
Admission: RE | Admit: 2020-06-17 | Discharge: 2020-06-17 | Disposition: A | Discharge status: Home / Self Care | Payer: Medicare HMO | Source: Ambulatory Visit | Attending: Family Medicine | Admitting: Family Medicine

## 2020-06-17 ENCOUNTER — Other Ambulatory Visit: Payer: Self-pay

## 2020-06-17 DIAGNOSIS — Z1231 Encounter for screening mammogram for malignant neoplasm of breast: Secondary | ICD-10-CM

## 2020-06-18 ENCOUNTER — Ambulatory Visit (INDEPENDENT_AMBULATORY_CARE_PROVIDER_SITE_OTHER): Payer: Medicare HMO

## 2020-06-18 VITALS — BP 120/68 | HR 78 | Temp 98.1°F | Ht 66.0 in | Wt 164.0 lb

## 2020-06-18 DIAGNOSIS — Z0000 Encounter for general adult medical examination without abnormal findings: Secondary | ICD-10-CM

## 2020-06-18 NOTE — Progress Notes (Signed)
Subjective:   Elma Shands is a 80 y.o. female who presents for Medicare Annual (Subsequent) preventive examination.  Review of Systems    n/a       Objective:    There were no vitals filed for this visit. There is no height or weight on file to calculate BMI.  Advanced Directives 04/03/2015 02/24/2015 03/14/2014  Does Patient Have a Medical Advance Directive? No No No  Would patient like information on creating a medical advance directive? No - patient declined information Yes - Educational materials given Yes - Educational materials given    Current Medications (verified) Outpatient Encounter Medications as of 06/18/2020  Medication Sig  . Acetaminophen (TYLENOL PO) Take 500 mg by mouth as needed.  . Biotin (SUPER BIOTIN) 5 MG TABS Take by mouth.  . Calcium-Vitamin D-Vitamin K [id number] MG-UNT-MCG CHEW Chew by mouth.  . cetirizine (ZYRTEC) 5 MG tablet Take by mouth.  . Cholecalciferol (VITAMIN D3) 50 MCG (2000 UT) capsule Take by mouth.  . Famotidine (PEPCID PO) Take by mouth daily.  . polyethylene glycol (MIRALAX / GLYCOLAX) packet Take 17 g by mouth daily as needed. For constipation.   No facility-administered encounter medications on file as of 06/18/2020.    Allergies (verified) Penicillins   History: Past Medical History:  Diagnosis Date  . Acid reflux   . Allergy   . 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 Blood in stool   . Chicken pox   . Chronic kidney disease    UTI and kidneystones  . Constipation    unknown urology procedure 2006  . Hiatal hernia   . Kidney stones   . Pneumonia 1968  . Urinary tract infection    Past Surgical History:  Procedure Laterality Date  . CATARACT EXTRACTION W/PHACO Right 02/24/2015   Procedure: CATARACT EXTRACTION PHACO AND INTRAOCULAR LENS PLACEMENT RIGHT EYE CDE=8.01;  Surgeon: Tonny Branch, MD;  Location: [facility];  Service: Ophthalmology;  Laterality: Right;  . CATARACT EXTRACTION W/PHACO Left 04/03/2015   Procedure: CATARACT  EXTRACTION PHACO AND INTRAOCULAR LENS PLACEMENT (IOC);  Surgeon: Tonny Branch, MD;  Location: [facility];  Service: Ophthalmology;  Laterality: Left;  CDE:11.74  . DILATION AND CURETTAGE OF UTERUS    . LITHOTRIPSY    . TONSILLECTOMY  1951   Family History  Problem Relation Age of Onset  . Breast cancer Maternal Aunt   . Arthritis Mother   . Hypertension Mother   . Arthritis Father   . Hypertension Father    Social History   Socioeconomic History  . Marital status: Married    Spouse name: Not on file  . Number of children: Not on file  . Years of education: Not on file  . Highest education level: Not on file  Occupational History  . Not on file  Tobacco Use  . Smoking status: Never Smoker  . Smokeless tobacco: Never Used  Substance and Sexual Activity  . Alcohol use: No  . Drug use: No  . Sexual activity: Not on file  Other Topics Concern  . Not on file  Social History Narrative  . Not on file   Social Determinants of Health   Financial Resource Strain: Not on file  Food Insecurity: Not on file  Transportation Needs: Not on file  Physical Activity: Not on file  Stress: Not on file  Social Connections: Not on file    Tobacco Counseling Counseling given: Not Answered   Clinical Intake:  Diabetic?no         Activities of Daily Living In your present state of health, do you have any difficulty performing the following activities: 09/18/2019  Hearing? N  Vision? N  Difficulty concentrating or making decisions? N  Walking or climbing stairs? N  Dressing or bathing? N  Doing errands, shopping? N  Some recent data might be hidden    Patient Care Team: Martinique, Betty G, MD as PCP - General (Family Medicine)  Indicate any recent Medical Services you may have received from other than [facility] providers in the past year (date may be approximate).     Assessment:   This is a routine wellness examination for Jasmon.  Hearing/Vision screen No  exam data present  Dietary issues and exercise activities discussed:    Goals Addressed   None    Depression Screen PHQ 2/9 Scores 09/18/2019 04/12/2017  PHQ - 2 Score 0 0    Fall Risk Fall Risk  09/18/2019 09/13/2018  Falls in the past year? 0 0  Number falls in past yr: - 0  Injury with Fall? - 0  Follow up - Education provided    FALL RISK PREVENTION PERTAINING TO THE HOME:  Any stairs in or around the home? Yes  If so, are there any without handrails? No  Home free of loose throw rugs in walkways, pet beds, electrical cords, etc? Yes  Adequate lighting in your home to reduce risk of falls? Yes   ASSISTIVE DEVICES UTILIZED TO PREVENT FALLS:  Life alert? No  Use of a cane, walker or w/c? No  Grab bars in the bathroom? No  Shower chair or bench in shower? No  Elevated toilet seat or a handicapped toilet? No   TIMED UP AND GO:  Was the test performed? Yes .  Length of time to ambulate 10 feet: 10 sec.   Gait steady and fast without use of assistive device  Cognitive Function:        Immunizations Immunization History  Administered Date(s) Administered  . Influenza, High Dose Seasonal PF 10/12/2017, 10/10/2018  . Influenza,inj,Quad PF,6+ Mos 10/13/2015  . Pneumococcal Conjugate-13 04/09/2016  . Pneumococcal Polysaccharide-23 01/12/2011  . Tdap 01/14/2011  . Zoster, Live 01/12/2007    TDAP status: Up to date  Flu Vaccine status: Up to date  Pneumococcal vaccine status: Up to date  Covid-19 vaccine status: Completed vaccines  Qualifies for Shingles Vaccine? Yes   Zostavax completed Yes   Shingrix Completed?: Yes  Screening Tests Health Maintenance  Topic Date Due  . COVID-19 Vaccine (1) Never done  . Zoster Vaccines- Shingrix (1 of 2) Never done  . INFLUENZA VACCINE  08/11/2020  . TETANUS/TDAP  01/13/2021  . DEXA SCAN  Completed  . PNA vac Low Risk Adult  Completed  . Pneumococcal Vaccine 52-53 Years old  Aged Out  . HPV VACCINES  Aged Out     Health Maintenance  Health Maintenance Due  Topic Date Due  . COVID-19 Vaccine (1) Never done  . Zoster Vaccines- Shingrix (1 of 2) Never done    Colorectal cancer screening: Type of screening: Colonoscopy. Completed 06/17/2017. Repeat every 5 years  Mammogram status: Completed 06/17/2020. Repeat every year  Bone Density status: Completed 09/26/2019. Results reflect: Bone density results: OSTEOPOROSIS. Repeat every 2 years.  Lung Cancer Screening: (Low Dose CT Chest recommended if Age 43-80 years, 30 pack-year currently smoking OR have quit w/in 15years.) does not qualify.   Lung Cancer Screening Referral: n/a  Additional  Screening:  Hepatitis C Screening: does qualify  Vision Screening: Recommended annual ophthalmology exams for early detection of glaucoma and other disorders of the eye. Is the patient up to date with their annual eye exam?  Yes  Who is the provider or what is the name of the office in which the patient attends annual eye exams? Dr.Cotter If pt is not established with a provider, would they like to be referred to a provider to establish care? No .   Dental Screening: Recommended annual dental exams for proper oral hygiene  Community Resource Referral / Chronic Care Management: CRR required this visit?  No   CCM required this visit?  No      Plan:     I have personally reviewed and noted the following in the patient's chart:   . Medical and social history . Use of alcohol, tobacco or illicit drugs  . Current medications and supplements including opioid prescriptions.  . Functional ability and status . Nutritional status . Physical activity . Advanced directives . List of other physicians . Hospitalizations, surgeries, and ER visits in previous 12 months . Vitals . Screenings to include cognitive, depression, and falls . Referrals and appointments  In addition, I have reviewed and discussed with patient certain preventive protocols, quality  metrics, and best practice recommendations. A written personalized care plan for preventive services as well as general preventive health recommendations were provided to patient.     Randel Pigg, LPN   08/15/6941   Nurse Notes: none

## 2020-06-18 NOTE — Patient Instructions (Addendum)
Tanya Swanson , Thank you for taking time to come for your Medicare Wellness Visit. I appreciate your ongoing commitment to your health goals. Please review the following plan we discussed and let me know if I can assist you in the future.   Screening recommendations/referrals: Colonoscopy: 06/17/2017 due 2024 Mammogram: current due 2023 Bone Density: 09/26/2019  Due 2023 Recommended yearly ophthalmology/optometry visit for glaucoma screening and checkup Recommended yearly dental visit for hygiene and checkup  Vaccinations: Influenza vaccine: due in fall of 2022 Pneumococcal vaccine: completed series  Tdap vaccine: current per pt  Shingles vaccine: completed series     Advanced directives: will provide copies   Conditions/risks identified: none   Next appointment: CPE 09/23/2020  @900am  with Dr.Jordan    Preventive Care 81 Years and Older, Female Preventive care refers to lifestyle choices and visits with your health care provider that can promote health and wellness. What does preventive care include?  A yearly physical exam. This is also called an annual well check.  Dental exams once or twice a year.  Routine eye exams. Ask your health care provider how often you should have your eyes checked.  Personal lifestyle choices, including:  Daily care of your teeth and gums.  Regular physical activity.  Eating a healthy diet.  Avoiding tobacco and drug use.  Limiting alcohol use.  Practicing safe sex.  Taking low-dose aspirin every day.  Taking vitamin and mineral supplements as recommended by your health care provider. What happens during an annual well check? The services and screenings done by your health care provider during your annual well check will depend on your age, overall health, lifestyle risk factors, and family history of disease. Counseling  Your health care provider may ask you questions about your:  Alcohol use.  Tobacco use.  Drug  use.  Emotional well-being.  Home and relationship well-being.  Sexual activity.  Eating habits.  History of falls.  Memory and ability to understand (cognition).  Work and work Statistician.  Reproductive health. Screening  You may have the following tests or measurements:  Height, weight, and BMI.  Blood pressure.  Lipid and cholesterol levels. These may be checked every 5 years, or more frequently if you are over 45 years old.  Skin check.  Lung cancer screening. You may have this screening every year starting at age 81 if you have a 30-pack-year history of smoking and currently smoke or have quit within the past 15 years.  Fecal occult blood test (FOBT) of the stool. You may have this test every year starting at age 81.  Flexible sigmoidoscopy or colonoscopy. You may have a sigmoidoscopy every 5 years or a colonoscopy every 10 years starting at age 55.  Hepatitis C blood test.  Hepatitis B blood test.  Sexually transmitted disease (STD) testing.  Diabetes screening. This is done by checking your blood sugar (glucose) after you have not eaten for a while (fasting). You may have this done every 1-3 years.  Bone density scan. This is done to screen for osteoporosis. You may have this done starting at age 59.  Mammogram. This may be done every 1-2 years. Talk to your health care provider about how often you should have regular mammograms. Talk with your health care provider about your test results, treatment options, and if necessary, the need for more tests. Vaccines  Your health care provider may recommend certain vaccines, such as:  Influenza vaccine. This is recommended every year.  Tetanus, diphtheria, and acellular pertussis (Tdap,  Td) vaccine. You may need a Td booster every 10 years.  Zoster vaccine. You may need this after age 50.  Pneumococcal 13-valent conjugate (PCV13) vaccine. One dose is recommended after age 66.  Pneumococcal polysaccharide  (PPSV23) vaccine. One dose is recommended after age 37. Talk to your health care provider about which screenings and vaccines you need and how often you need them. This information is not intended to replace advice given to you by your health care provider. Make sure you discuss any questions you have with your health care provider. Document Released: 01/24/2015 Document Revised: 09/17/2015 Document Reviewed: 10/29/2014 Elsevier Interactive Patient Education  2017 [place] Prevention in the Home Falls can cause injuries. They can happen to people of all ages. There are many things you can do to make your home safe and to help prevent falls. What can I do on the outside of my home?  Regularly fix the edges of walkways and driveways and fix any cracks.  Remove anything that might make you trip as you walk through a door, such as a raised step or threshold.  Trim any bushes or trees on the path to your home.  Use bright outdoor lighting.  Clear any walking paths of anything that might make someone trip, such as rocks or tools.  Regularly check to see if handrails are loose or broken. Make sure that both sides of any steps have handrails.  Any raised decks and porches should have guardrails on the edges.  Have any leaves, snow, or ice cleared regularly.  Use sand or salt on walking paths during winter.  Clean up any spills in your garage right away. This includes oil or grease spills. What can I do in the bathroom?  Use night lights.  Install grab bars by the toilet and in the tub and shower. Do not use towel bars as grab bars.  Use non-skid mats or decals in the tub or shower.  If you need to sit down in the shower, use a plastic, non-slip stool.  Keep the floor dry. Clean up any water that spills on the floor as soon as it happens.  Remove soap buildup in the tub or shower regularly.  Attach bath mats securely with double-sided non-slip rug tape.  Do not have  throw rugs and other things on the floor that can make you trip. What can I do in the bedroom?  Use night lights.  Make sure that you have a light by your bed that is easy to reach.  Do not use any sheets or blankets that are too big for your bed. They should not hang down onto the floor.  Have a firm chair that has side arms. You can use this for support while you get dressed.  Do not have throw rugs and other things on the floor that can make you trip. What can I do in the kitchen?  Clean up any spills right away.  Avoid walking on wet floors.  Keep items that you use a lot in easy-to-reach places.  If you need to reach something above you, use a strong step stool that has a grab bar.  Keep electrical cords out of the way.  Do not use floor polish or wax that makes floors slippery. If you must use wax, use non-skid floor wax.  Do not have throw rugs and other things on the floor that can make you trip. What can I do with my stairs?  Do not  leave any items on the stairs.  Make sure that there are handrails on both sides of the stairs and use them. Fix handrails that are broken or loose. Make sure that handrails are as long as the stairways.  Check any carpeting to make sure that it is firmly attached to the stairs. Fix any carpet that is loose or worn.  Avoid having throw rugs at the top or bottom of the stairs. If you do have throw rugs, attach them to the floor with carpet tape.  Make sure that you have a light switch at the top of the stairs and the bottom of the stairs. If you do not have them, ask someone to add them for you. What else can I do to help prevent falls?  Wear shoes that:  Do not have high heels.  Have rubber bottoms.  Are comfortable and fit you well.  Are closed at the toe. Do not wear sandals.  If you use a stepladder:  Make sure that it is fully opened. Do not climb a closed stepladder.  Make sure that both sides of the stepladder are  locked into place.  Ask someone to hold it for you, if possible.  Clearly mark and make sure that you can see:  Any grab bars or handrails.  First and last steps.  Where the edge of each step is.  Use tools that help you move around (mobility aids) if they are needed. These include:  Canes.  Walkers.  Scooters.  Crutches.  Turn on the lights when you go into a dark area. Replace any light bulbs as soon as they burn out.  Set up your furniture so you have a clear path. Avoid moving your furniture around.  If any of your floors are uneven, fix them.  If there are any pets around you, be aware of where they are.  Review your medicines with your doctor. Some medicines can make you feel dizzy. This can increase your chance of falling. Ask your doctor what other things that you can do to help prevent falls. This information is not intended to replace advice given to you by your health care provider. Make sure you discuss any questions you have with your health care provider. Document Released: 10/24/2008 Document Revised: 06/05/2015 Document Reviewed: 02/01/2014 Elsevier Interactive Patient Education  2017 [employer].

## 2020-06-20 NOTE — Patient Instructions (Signed)
error 

## 2020-09-22 ENCOUNTER — Telehealth: Payer: Self-pay | Admitting: Family Medicine

## 2020-09-23 ENCOUNTER — Other Ambulatory Visit: Payer: Self-pay

## 2020-09-23 ENCOUNTER — Ambulatory Visit (INDEPENDENT_AMBULATORY_CARE_PROVIDER_SITE_OTHER): Payer: Medicare HMO | Admitting: Family Medicine

## 2020-09-23 ENCOUNTER — Encounter: Payer: Self-pay | Admitting: Family Medicine

## 2020-09-23 VITALS — BP 120/70 | HR 83 | Temp 98.2°F | Resp 16 | Ht 66.0 in | Wt 166.0 lb

## 2020-09-23 DIAGNOSIS — R208 Other disturbances of skin sensation: Secondary | ICD-10-CM

## 2020-09-23 DIAGNOSIS — Z0000 Encounter for general adult medical examination without abnormal findings: Secondary | ICD-10-CM

## 2020-09-23 DIAGNOSIS — M542 Cervicalgia: Secondary | ICD-10-CM | POA: Diagnosis not present

## 2020-09-23 DIAGNOSIS — E559 Vitamin D deficiency, unspecified: Secondary | ICD-10-CM | POA: Diagnosis not present

## 2020-09-23 DIAGNOSIS — R350 Frequency of micturition: Secondary | ICD-10-CM

## 2020-09-23 DIAGNOSIS — M810 Age-related osteoporosis without current pathological fracture: Secondary | ICD-10-CM

## 2020-09-23 DIAGNOSIS — Z130 Encounter for screening for diseases of the blood and blood-forming organs and certain disorders involving the immune mechanism: Secondary | ICD-10-CM

## 2020-09-23 LAB — URINALYSIS, ROUTINE W REFLEX MICROSCOPIC
Bilirubin Urine: NEGATIVE
Hgb urine dipstick: NEGATIVE
Ketones, ur: NEGATIVE
Nitrite: NEGATIVE
RBC / HPF: NONE SEEN (ref 0–?)
Specific Gravity, Urine: 1.01 (ref 1.000–1.030)
Total Protein, Urine: NEGATIVE
Urine Glucose: NEGATIVE
Urobilinogen, UA: 0.2 (ref 0.0–1.0)
pH: 6 (ref 5.0–8.0)

## 2020-09-23 LAB — BASIC METABOLIC PANEL
BUN: 15 mg/dL (ref 6–23)
CO2: 27 mEq/L (ref 19–32)
Calcium: 9.3 mg/dL (ref 8.4–10.5)
Chloride: 106 mEq/L (ref 96–112)
Creatinine, Ser: 0.81 mg/dL (ref 0.40–1.20)
GFR: 67.99 mL/min (ref >60.00)
Glucose, Bld: 92 mg/dL (ref 70–99)
Potassium: 3.8 mEq/L (ref 3.5–5.1)
Sodium: 141 mEq/L (ref 135–145)

## 2020-09-23 LAB — VITAMIN B12: Vitamin B-12: 147 pg/mL — ABNORMAL LOW (ref 211–911)

## 2020-09-23 LAB — VITAMIN D 25 HYDROXY (VIT D DEFICIENCY, FRACTURES): VITD: 82.47 ng/mL (ref 30.00–100.00)

## 2020-09-23 NOTE — Patient Instructions (Addendum)
A few things to remember from today's visit:   Routine general medical examination at a health care facility  Osteoporosis, unspecified osteoporosis type, unspecified pathological fracture presence  Screening for endocrine, metabolic, and immunity disorder  Vitamin D deficiency, unspecified - Plan: Basic Metabolic Panel, VITAMIN D 25 Hydroxy (Vit-D Deficiency, Fractures)  Urinary frequency - Plan: Urinalysis, Routine w reflex microscopic  Burning sensation of feet - Plan: Vitamin B12  Neck pain - Plan: DG Cervical Spine Complete  Do not use My Chart to request refills or for acute issues that need immediate attention.  A few tips:  -As we age balance is not as good as it was, so there is a higher risks for falls. Please remove small rugs and furniture that is "in your way" and could increase the risk of falls. Stretching exercises may help with fall prevention: Yoga and Tai Chi are some examples. Low impact exercise is better, so you are not very achy the next day.  -Sun screen and avoidance of direct sun light recommended. Caution with dehydration, if working outdoors be sure to drink enough fluids.  - Some medications are not safe as we age, increases the risk of side effects and can potentially interact with other medication you are also taken;  including some of over the counter medications. Be sure to let me know when you start a new medication even if it is a dietary/vitamin supplement.   -Healthy diet low in red meet/animal fat and sugar + regular physical activity is recommended.    You can come next week for neck X ray. Local heat/ice or icy hot may help. Acetaminophen 500 mg 3 times daily as needed.  Please be sure medication list is accurate. If a new problem present, please set up appointment sooner than planned today.

## 2020-09-23 NOTE — Progress Notes (Signed)
Chief Complaint  Patient presents with   Annual Exam    Had a bladder infection on Labor day - seen at urgent care. They advised for pt to have her urine rechecked at her next visit with pcp.    Follow-up   HPI: Tanya Swanson is a 80 y.o. female, who is here today for her routine physical.  Last CPE: 09/18/2019.  Regular exercise 3 or more time per week: She has not been back to the [facility] since 2020.She walks a few times per week, [place]. 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 She lives with her husband.  Chronic medical problems: OA,osteoporosis,GERD, hearing loss,and vit D def among some.  Immunization History  Administered Date(s) Administered   Influenza Split 12/20/2003, 12/08/2004, 11/11/2005, 11/11/2006, 10/12/2007, 10/24/2008, 10/23/2009, 11/04/2010, 10/18/2011, 10/24/2019   Influenza, High Dose Seasonal PF 10/12/2017, 10/10/2018   Influenza,inj,Quad PF,6+ Mos 10/13/2015   Pneumococcal Conjugate-13 07/22/2014, 04/09/2016   Pneumococcal Polysaccharide-23 01/12/2011, 10/18/2011   Td 05/08/1991   Tdap 01/14/2011   Zoster, Live 01/12/2007   Mammogram: 06/17/20 Bi-Rads 1. Colonoscopy: 06/17/17. DEXA: 09/26/19 osteoporosis. She is on Prolia.  Concerns and follow up today: Vit D insufficiency: Vit D 2000 U daily. GERD: Pepcid recommended by her GI.  She just completed abx treatment, Cipro. In 05/2020 she had frequency. She would like her urine check. Currently asymptomatic.  Neck pain "sometimes",started a few weeks ago. Denies prior hx. No hx of trauma. She wonders if this is related with Prolia. Pain is not radiated. Exacerbated by certain movements.  Feet burning sensation for the past 6 months,usually at night, not every night. It does not interfere with sleep. She has not noted skin lesions or cyanosis. She has not identified exacerbating or alleviating factors.  Lab Results  Component Value Date    TSH 1.26 07/24/2019   Review of Systems  Constitutional:  Negative for activity change, appetite change, fatigue and fever.  HENT:  Positive for hearing loss. Negative for mouth sores, nosebleeds, sore throat and trouble swallowing.   Eyes:  Negative for redness and visual disturbance.  Respiratory:  Negative for cough, shortness of breath and wheezing.   Cardiovascular:  Negative for chest pain, palpitations and leg swelling.  Gastrointestinal:  Negative for abdominal pain, nausea and vomiting.       Negative for changes in bowel habits.  Endocrine: Negative for cold intolerance, heat intolerance, polydipsia, polyphagia and polyuria.  Genitourinary:  Negative for decreased urine volume, dysuria and hematuria.  Musculoskeletal:  Positive for arthralgias. Negative for gait problem and myalgias.  Allergic/Immunologic: Positive for environmental allergies.  Neurological:  Negative for syncope, weakness and headaches.  Psychiatric/Behavioral:  Negative for confusion. The patient is nervous/anxious.   All other systems reviewed and are negative.  Current Outpatient Medications on File Prior to Visit  Medication Sig Dispense Refill   Acetaminophen (TYLENOL PO) Take 500 mg by mouth as needed.     Biotin 5 MG TABS Take by mouth.     Calcium-Vitamin D-Vitamin K [id number] MG-UNT-MCG CHEW Chew by mouth.     Cholecalciferol (VITAMIN D3) 50 MCG (2000 UT) capsule Take by mouth.     cycloSPORINE (RESTASIS) 0.05 % ophthalmic emulsion Place 1 drop into both eyes 2 (two) times daily.     Famotidine (PEPCID PO) Take by mouth daily.     polyethylene glycol (MIRALAX / GLYCOLAX) packet Take 17 g by mouth daily as needed. For constipation.  cetirizine (ZYRTEC) 5 MG tablet Take by mouth.     No current facility-administered medications on file prior to visit.   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   Blood in stool    Chicken pox    Chronic  kidney disease    UTI and kidneystones   Constipation    unknown urology procedure 2006   Hiatal hernia    Kidney stones    Pneumonia 1968   Urinary tract infection     Past Surgical History:  Procedure Laterality Date   CATARACT EXTRACTION W/PHACO Right 02/24/2015   Procedure: CATARACT EXTRACTION PHACO AND INTRAOCULAR LENS PLACEMENT RIGHT EYE CDE=8.01;  Surgeon: Tonny Branch, MD;  Location: [facility];  Service: Ophthalmology;  Laterality: Right;   CATARACT EXTRACTION W/PHACO Left 04/03/2015   Procedure: CATARACT EXTRACTION PHACO AND INTRAOCULAR LENS PLACEMENT (IOC);  Surgeon: Tonny Branch, MD;  Location: [facility];  Service: Ophthalmology;  Laterality: Left;  CDE:11.74   DILATION AND CURETTAGE OF UTERUS     LITHOTRIPSY     TONSILLECTOMY  1951    Allergies  Allergen Reactions   Penicillins Other (See Comments)    Has patient had a PCN reaction causing immediate rash, facial/tongue/throat swelling, SOB or lightheadedness with hypotension: Yes Has patient had a PCN reaction causing severe rash involving mucus membranes or skin necrosis: No Has patient had a PCN reaction that required hospitalization No Has patient had a PCN reaction occurring within the last 10 years: No If all of the above answers are "NO", then may proceed with Cephalosporin use. Passed out Has patient had a PCN reaction causing immediate rash, facial/tongue/throat swelling, SOB or lightheadedness with hypotension: Yes Has patient had a PCN reaction causing severe rash involving mucus membranes or skin necrosis: No Has patient had a PCN reaction that required hospitalization No Has patient had a PCN reaction occurring within the last 10 years: No If all of the above answers are "NO", then may proceed with Cephalosporin use. Passed out fainted Has patient had a PCN reaction causing immediate rash, facial/tongue/throat swelling, SOB or lightheadedness with hypotension: Yes Has patient had a PCN reaction causing severe rash  involving mucus membranes or skin necrosis: No Has patient had a PCN reaction that required hospitalization No Has patient had a PCN reaction occurring within the last 10 years: No If all of the above answers are "NO", then may proceed with Cephalosporin use. Passed out fainted Has patient had a PCN reaction causing immediate rash, facial/tongue/throat swelling, SOB or lightheadedness with hypotension: Yes Has patient had a PCN reaction causing severe rash involving mucus membranes or skin necrosis: No Has patient had a PCN reaction that required hospitalization No Has patient had a PCN reaction occurring within the last 10 years: No If all of the above answers are "NO", then may proceed with Cephalosporin use. Passed out Has patient had a PCN reaction causing immediate rash, facial/tongue/throat swelling, SOB or lightheadedness with hypotension: Yes Has patient had a PCN reaction causing severe rash involving mucus membranes or skin necrosis: No Has patient had a PCN reaction that required hospitalization No Has patient had a PCN reaction occurring within the last 10 years: No If all of the above answers are "NO", then may proceed with Cephalosporin use. Passed out fainted    Family History  Problem Relation Age of Onset   Breast cancer Maternal Aunt    Arthritis Mother  Hypertension Mother    Arthritis Father    Hypertension Father     Social History   Socioeconomic History   Marital status: Married    Spouse name: Not on file   Number of children: Not on file   Years of education: Not on file   Highest education level: Not on file  Occupational History   Not on file  Tobacco Use   Smoking status: Never   Smokeless tobacco: Never  Substance and Sexual Activity   Alcohol use: No   Drug use: No   Sexual activity: Not on file  Other Topics Concern   Not on file  Social History Narrative   Not on file   Social Determinants of Health   Financial Resource Strain:  Low Risk    Difficulty of Paying Living Expenses: Not hard at all  Food Insecurity: No Food Insecurity   Worried About Running Out of Food in the Last Year: Never true   [place] in the Last Year: Never true  Transportation Needs: No Transportation Needs   Lack of Transportation (Medical): No   Lack of Transportation (Non-Medical): No  Physical Activity: Insufficiently Active   Days of Exercise per Week: 3 days   Minutes of Exercise per Session: 30 min  Stress: No Stress Concern Present   Feeling of Stress : Not at all  Social Connections: Socially Integrated   Frequency of Communication with Friends and Family: Twice a week   Frequency of Social Gatherings with Friends and Family: Twice a week   Attends Religious Services: More than 4 times per year   Active Member of [employer] or Organizations: Yes   Attends Archivist Meetings: 1 to 4 times per year   Marital Status: Married   Vitals:   09/23/20 0848  BP: 120/70  Pulse: 83  Resp: 16  Temp: 98.2 F (36.8 C)  SpO2: 97%   Body mass index is 26.79 kg/m.  Wt Readings from Last 3 Encounters:  09/23/20 166 lb (75.3 kg)  06/18/20 164 lb (74.4 kg)  09/18/19 165 lb (74.8 kg)   Physical Exam Vitals and nursing note reviewed.  Constitutional:      General: She is not in acute distress.    Appearance: She is well-developed.  HENT:     Head: Normocephalic and atraumatic.     Right Ear: External ear normal.     Left Ear: External ear normal.     Ears:     Comments: Hearing aids, excess cerumen , TM's seen partially.    Mouth/Throat:     Mouth: Mucous membranes are moist.     Pharynx: Oropharynx is clear. Uvula midline.  Eyes:     Extraocular Movements: Extraocular movements intact.     Conjunctiva/sclera: Conjunctivae normal.     Pupils: Pupils are equal, round, and reactive to light.  Neck:     Thyroid: No thyroid mass.     Trachea: No tracheal deviation.  Cardiovascular:     Rate and Rhythm: Normal rate  and regular rhythm.     Pulses:          Dorsalis pedis pulses are 2+ on the right side and 2+ on the left side.     Heart sounds: No murmur heard.    Comments: Varicose veins LE, bilateral. Pulmonary:     Effort: Pulmonary effort is normal. No respiratory distress.     Breath sounds: Normal breath sounds.  Abdominal:     Palpations:  Abdomen is soft. There is no hepatomegaly or mass.     Tenderness: There is no abdominal tenderness.  Musculoskeletal:     Cervical back: No edema, erythema, spasms or bony tenderness. Pain with movement present. Decreased range of motion (Left rotation mainly.).     Right lower leg: No edema.     Left lower leg: No edema.     Comments: No signs of synovitis appreciated.  Lymphadenopathy:     Cervical: No cervical adenopathy.  Skin:    General: Skin is warm.     Findings: No erythema or rash.  Neurological:     General: No focal deficit present.     Mental Status: She is alert and oriented to person, place, and time.     Cranial Nerves: No cranial nerve deficit.     Deep Tendon Reflexes:     Reflex Scores:      Patellar reflexes are 2+ on the right side and 2+ on the left side.    Comments: Stable gait, not assisted.  Psychiatric:        Speech: Speech normal.     Comments: Well groomed, good eye contact.   ASSESSMENT AND PLAN:  Tanya Swanson was here today annual physical examination.  Orders Placed This Encounter  Procedures   DG Cervical Spine Complete   Basic Metabolic Panel   VITAMIN D 25 Hydroxy (Vit-D Deficiency, Fractures)   Urinalysis, Routine w reflex microscopic   Vitamin B12   Lab Results  Component Value Date   CREATININE 0.81 09/23/2020   BUN 15 09/23/2020   NA 141 09/23/2020   K 3.8 09/23/2020   CL 106 09/23/2020   CO2 27 09/23/2020   Lab Results  Component Value Date   VITAMINB12 147 (L) 09/23/2020   Routine general medical examination at a health care facility We discussed the importance of regular  physical activity and healthy diet for prevention of chronic illness and/or complications. Preventive guidelines reviewed. Vaccination up to date. Ca++ and vit D supplementation to continue. Next CPE in a year.  Osteoporosis, unspecified osteoporosis type, unspecified pathological fracture presence Continue adequate Ca++ and vit D intake. Fall precautions and weigh baring exercises 2 times per week. Continue Prolia q 6 months, some side effects discussed.  Urinary frequency Improved. We discussed other possible Dx, including non infectious causes.  Burning sensation of feet Possible etiologies discussed. Appropriate foot care. Further recommendations according to lab results.  Neck pain Musculoskeletal. Because new onset , cervical X ray ordered. ROM exercises recommended.  Vitamin D insufficiency Continue current dose of vit D supplementation. Further recommendations will be given according to 25 OH vit D result.  Return in about 1 year (around 09/23/2021) for CPE.  Presten Joost G. Martinique, MD  [facility]. [place] office.

## 2020-09-24 ENCOUNTER — Ambulatory Visit (INDEPENDENT_AMBULATORY_CARE_PROVIDER_SITE_OTHER)
Admission: RE | Admit: 2020-09-24 | Discharge: 2020-09-24 | Disposition: A | Discharge status: Home / Self Care | Payer: Medicare HMO | Source: Ambulatory Visit | Attending: Family Medicine | Admitting: Family Medicine

## 2020-09-24 DIAGNOSIS — M542 Cervicalgia: Secondary | ICD-10-CM | POA: Diagnosis not present

## 2020-09-25 NOTE — Telephone Encounter (Signed)
error 

## 2020-09-29 ENCOUNTER — Telehealth: Payer: Self-pay | Admitting: Family Medicine

## 2020-09-29 NOTE — Telephone Encounter (Signed)
Patient stated that she could only see half of her lab results on MyChart. She is requesting that someone call her with the rest of her results.  Patient can be contacted at [phone].  Please advise.

## 2020-09-29 NOTE — Telephone Encounter (Signed)
See result note.  

## 2020-10-01 ENCOUNTER — Other Ambulatory Visit: Payer: Self-pay

## 2020-10-01 ENCOUNTER — Ambulatory Visit (INDEPENDENT_AMBULATORY_CARE_PROVIDER_SITE_OTHER): Payer: Medicare HMO

## 2020-10-01 DIAGNOSIS — E538 Deficiency of other specified B group vitamins: Secondary | ICD-10-CM | POA: Diagnosis not present

## 2020-10-01 MED ORDER — CYANOCOBALAMIN 1000 MCG/ML IJ SOLN
1000.0000 ug | Freq: Once | INTRAMUSCULAR | Status: AC
Start: 2020-10-01 — End: 2020-10-01
  Administered 2020-10-01: 1000 ug via INTRAMUSCULAR

## 2020-10-01 NOTE — Progress Notes (Signed)
Per orders of Dr. Jordan, injection of B12 given by Bob Eastwood E Tajha Sammarco. Patient tolerated injection well.  

## 2020-10-08 ENCOUNTER — Other Ambulatory Visit: Payer: Self-pay

## 2020-10-08 ENCOUNTER — Ambulatory Visit (INDEPENDENT_AMBULATORY_CARE_PROVIDER_SITE_OTHER): Payer: Medicare HMO

## 2020-10-08 DIAGNOSIS — E538 Deficiency of other specified B group vitamins: Secondary | ICD-10-CM

## 2020-10-08 MED ORDER — CYANOCOBALAMIN 1000 MCG/ML IJ SOLN
1000.0000 ug | Freq: Once | INTRAMUSCULAR | Status: AC
  Administered 2020-10-08: 1000 ug via INTRAMUSCULAR

## 2020-10-08 NOTE — Progress Notes (Signed)
Per orders from Dr Martinique, pt was given B12 injection by Madaline Guthrie, pt tolerated well.

## 2020-10-15 ENCOUNTER — Ambulatory Visit (INDEPENDENT_AMBULATORY_CARE_PROVIDER_SITE_OTHER): Payer: Medicare HMO

## 2020-10-15 ENCOUNTER — Other Ambulatory Visit: Payer: Self-pay

## 2020-10-15 DIAGNOSIS — E538 Deficiency of other specified B group vitamins: Secondary | ICD-10-CM

## 2020-10-15 MED ORDER — CYANOCOBALAMIN 1000 MCG/ML IJ SOLN
1000.0000 ug | Freq: Once | INTRAMUSCULAR | Status: AC
  Administered 2020-10-15: 1000 ug via INTRAMUSCULAR

## 2020-10-15 NOTE — Progress Notes (Signed)
Per orders of Dr. Jordan, injection of B12 given by Keisa Blow E Myrah Strawderman. Patient tolerated injection well.  

## 2020-10-21 ENCOUNTER — Ambulatory Visit (INDEPENDENT_AMBULATORY_CARE_PROVIDER_SITE_OTHER): Payer: Medicare HMO

## 2020-10-21 ENCOUNTER — Other Ambulatory Visit: Payer: Self-pay

## 2020-10-21 DIAGNOSIS — E538 Deficiency of other specified B group vitamins: Secondary | ICD-10-CM | POA: Diagnosis not present

## 2020-10-21 MED ORDER — CYANOCOBALAMIN 1000 MCG/ML IJ SOLN
1000.0000 ug | Freq: Once | INTRAMUSCULAR | Status: AC
  Administered 2020-10-21: 1000 ug via INTRAMUSCULAR

## 2020-10-21 NOTE — Progress Notes (Signed)
Per orders from Dr Martinique, pt was given B12 injection by Madaline Guthrie, pt tolerated well.

## 2020-12-02 NOTE — Progress Notes (Signed)
Chief Complaint  Patient presents with   Follow-up   HPI: [name] is a 80 y.o. female, who is here today requesting ear lavage. According to patient she was evaluated by audiologist a few weeks ago to have hearing aids check. She has had right earache when she pushes hearing ear in ear canal. She was told it was wax and to have PCP washing her ears. Negative for fever, chills, changes in hearing, ear drainage, sore throat, or dizziness. Negative for recent URI or travel. She has not tried OTC medications.  She was last seen on 09/23/20.  Today she is complaining of LE pain when she is in bed at night. She has no pain with ambulation. This is a chronic problem. No numbness, tingling, or weakness. Burning like sensation,7/10. Worse for the past year. Hx of vein disease, she wears compression stocking, which help with LE edema.  Lab Results  Component Value Date   CREATININE 0.81 09/23/2020   BUN 15 09/23/2020   NA 141 09/23/2020   K 3.8 09/23/2020   CL 106 09/23/2020   CO2 27 09/23/2020   B12 deficiency on B12 1000 mcg IM every 4 weeks.  Lab Results  Component Value Date   VITAMINB12 147 (L) 09/23/2020   Lower back pain intermittent for years and also getting worse. Back pain does not affect her daily activities, it seems to be worse at night. She takes Tylenol at bedtime, which helps. Negative for saddle anesthesia or bowel/bladder dysfunction.  Review of Systems  Constitutional:  Negative for activity change, appetite change and unexpected weight change.  Respiratory:  Negative for cough, shortness of breath and wheezing.   Cardiovascular:  Negative for chest pain, palpitations and leg swelling.  Gastrointestinal:  Negative for abdominal pain, nausea and vomiting.  Neurological:  Negative for syncope, weakness and headaches.  Rest see pertinent positives and negatives per HPI.  Current Outpatient Medications on File Prior to Visit  Medication Sig  Dispense Refill   Acetaminophen (TYLENOL PO) Take 500 mg by mouth as needed.     Biotin 5 MG TABS Take by mouth.     Cholecalciferol (VITAMIN D3) 50 MCG (2000 UT) capsule Take by mouth.     cycloSPORINE (RESTASIS) 0.05 % ophthalmic emulsion Place 1 drop into both eyes 2 (two) times daily.     Famotidine (PEPCID PO) Take by mouth daily.     polyethylene glycol (MIRALAX / GLYCOLAX) packet Take 17 g by mouth daily as needed. For constipation.     Calcium-Vitamin D-Vitamin K 144-315-40 MG-UNT-MCG CHEW Chew by mouth. (Patient not taking: Reported on 12/03/2020)     cetirizine (ZYRTEC) 5 MG tablet Take by mouth.     No current facility-administered medications on file prior to visit.   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   Blood in stool    Chicken pox    Chronic kidney disease    UTI and kidneystones   Constipation    unknown urology procedure 2006   Hiatal hernia    Kidney stones    Pneumonia 1968   Urinary tract infection    Allergies  Allergen Reactions   Penicillins Other (See Comments)    Has patient had a PCN reaction causing immediate rash, facial/tongue/throat swelling, SOB or lightheadedness with hypotension: Yes Has patient had a PCN reaction causing severe rash involving mucus membranes or skin necrosis: No Has patient  had a PCN reaction that required hospitalization No Has patient had a PCN reaction occurring within the last 10 years: No If all of the above answers are "NO", then may proceed with Cephalosporin use. Passed out Has patient had a PCN reaction causing immediate rash, facial/tongue/throat swelling, SOB or lightheadedness with hypotension: Yes Has patient had a PCN reaction causing severe rash involving mucus membranes or skin necrosis: No Has patient had a PCN reaction that required hospitalization No Has patient had a PCN reaction occurring within the last 10 years: No If all of the above answers are  "NO", then may proceed with Cephalosporin use. Passed out fainted Has patient had a PCN reaction causing immediate rash, facial/tongue/throat swelling, SOB or lightheadedness with hypotension: Yes Has patient had a PCN reaction causing severe rash involving mucus membranes or skin necrosis: No Has patient had a PCN reaction that required hospitalization No Has patient had a PCN reaction occurring within the last 10 years: No If all of the above answers are "NO", then may proceed with Cephalosporin use. Passed out fainted Has patient had a PCN reaction causing immediate rash, facial/tongue/throat swelling, SOB or lightheadedness with hypotension: Yes Has patient had a PCN reaction causing severe rash involving mucus membranes or skin necrosis: No Has patient had a PCN reaction that required hospitalization No Has patient had a PCN reaction occurring within the last 10 years: No If all of the above answers are "NO", then may proceed with Cephalosporin use. Passed out Has patient had a PCN reaction causing immediate rash, facial/tongue/throat swelling, SOB or lightheadedness with hypotension: Yes Has patient had a PCN reaction causing severe rash involving mucus membranes or skin necrosis: No Has patient had a PCN reaction that required hospitalization No Has patient had a PCN reaction occurring within the last 10 years: No If all of the above answers are "NO", then may proceed with Cephalosporin use. Passed out fainted    Social History   Socioeconomic History   Marital status: Married    Spouse name: Not on file   Number of children: Not on file   Years of education: Not on file   Highest education level: Not on file  Occupational History   Not on file  Tobacco Use   Smoking status: Never   Smokeless tobacco: Never  Substance and Sexual Activity   Alcohol use: No   Drug use: No   Sexual activity: Not on file  Other Topics Concern   Not on file  Social History Narrative    Not on file   Social Determinants of Health   Financial Resource Strain: Low Risk    Difficulty of Paying Living Expenses: Not hard at all  Food Insecurity: No Food Insecurity   Worried About Running Out of Food in the Last Year: Never true   [place] in the Last Year: Never true  Transportation Needs: No Transportation Needs   Lack of Transportation (Medical): No   Lack of Transportation (Non-Medical): No  Physical Activity: Insufficiently Active   Days of Exercise per Week: 3 days   Minutes of Exercise per Session: 30 min  Stress: No Stress Concern Present   Feeling of Stress : Not at all  Social Connections: Socially Integrated   Frequency of Communication with Friends and Family: Twice a week   Frequency of Social Gatherings with Friends and Family: Twice a week   Attends Religious Services: More than 4 times per year   Active Member of Clubs or  Organizations: Yes   Attends Archivist Meetings: 1 to 4 times per year   Marital Status: Married   Vitals:   12/03/20 1005  BP: 118/68  Pulse: 73  Resp: 16  Temp: 98.4 F (36.9 C)  SpO2: 97%   Body mass index is 26.86 kg/m.  Physical Exam Vitals and nursing note reviewed.  Constitutional:      General: She is not in acute distress.    Appearance: She is well-developed.  HENT:     Head: Normocephalic and atraumatic.     Comments: Cerumen excess in ear canal, bilateral. Impacted.    Mouth/Throat:     Mouth: Mucous membranes are moist.     Pharynx: Oropharynx is clear.  Eyes:     Conjunctiva/sclera: Conjunctivae normal.  Cardiovascular:     Rate and Rhythm: Normal rate and regular rhythm.     Heart sounds: No murmur heard.    Comments: DP pulses present. Varicose veins LE, bilateral. Pulmonary:     Effort: Pulmonary effort is normal. No respiratory distress.     Breath sounds: Normal breath sounds.  Abdominal:     Palpations: Abdomen is soft. There is no hepatomegaly or mass.     Tenderness: There  is no abdominal tenderness.  Musculoskeletal:     Lumbar back: No tenderness or bony tenderness. Negative right straight leg raise test and negative left straight leg raise test.  Lymphadenopathy:     Cervical: No cervical adenopathy.  Skin:    General: Skin is warm.     Findings: No erythema or rash.  Neurological:     General: No focal deficit present.     Mental Status: She is alert and oriented to person, place, and time.     Cranial Nerves: No cranial nerve deficit.     Deep Tendon Reflexes:     Reflex Scores:      Patellar reflexes are 2+ on the right side and 2+ on the left side.    Comments: Stable gait, not assisted.  Psychiatric:     Comments: Well groomed, good eye contact.   ASSESSMENT AND PLAN:  Ms.Melannie was seen today for follow-up.  Diagnoses and all orders for this visit: Orders Placed This Encounter  Procedures   DG Lumbar Spine Complete   Chronic bilateral low back pain, unspecified whether sciatica present We discussed possible etiologies, most likely degenerative disc disease/arthritis. She has not had imaging in a while, so we will obtain lumbar x-ray today. Continue Tylenol 500 mg 1 to 2 tablets at bedtime.  Paresthesia of both lower extremities Problem has been going on for years. We discussed possible etiologies. Peripheral pulses are present. ?  Peripheral neuropathy/radiculopathy. She prefers to hold on neurology referral or further work-up. She agrees with trying gabapentin 100 mg at bedtime.  We discussed some side effects. Fall prevention. Instructed about warning signs. Follow-up in 2 months, before if needed.  -     gabapentin (NEURONTIN) 100 MG capsule; Take 1 capsule (100 mg total) by mouth at bedtime.  Bilateral hearing loss due to cerumen impaction We discussed ear lavage cons and pros, she would like to try. My assistance performed ear lavage of right ear, unsuccessful. I removed some cerumen from ear canal with a curette after  verbal consent, she had some discomfort but in general well tolerated. Noted erythema of ear canal. TM no erythematous. We decided to hold on ear lavage of left ear.  Acute otitis externa of right ear, unspecified type  Recommend avoiding wearing hearing aids for 7 to 10 days. Topical treatment with Ciprodex. Instructed about warning signs.  -     ciprofloxacin-dexamethasone (CIPRODEX) OTIC suspension; Place 4 drops into the right ear 2 (two) times daily for 7 days.  B12 deficiency She just completed B12 1000 mcg weekly x4, she can continue every 4 to 5 weeks. We will recheck B12 with next blood work next visit.  I spent a total of 43 minutes in both face to face and non face to face activities for this visit on the date of this encounter. During this time history was obtained and documented, examination was performed, prior labs reviewed, and assessment/plan discussed.  Return in about 2 months (around 02/02/2021).  Darlean Warmoth G. Martinique, MD  [facility]. [place] office.

## 2020-12-03 ENCOUNTER — Other Ambulatory Visit: Payer: Self-pay

## 2020-12-03 ENCOUNTER — Ambulatory Visit (INDEPENDENT_AMBULATORY_CARE_PROVIDER_SITE_OTHER): Payer: Medicare HMO

## 2020-12-03 ENCOUNTER — Encounter: Payer: Self-pay | Admitting: Family Medicine

## 2020-12-03 ENCOUNTER — Ambulatory Visit (INDEPENDENT_AMBULATORY_CARE_PROVIDER_SITE_OTHER): Payer: Medicare HMO | Admitting: Family Medicine

## 2020-12-03 VITALS — BP 118/68 | HR 73 | Temp 98.4°F | Resp 16 | Ht 66.0 in | Wt 166.4 lb

## 2020-12-03 DIAGNOSIS — E538 Deficiency of other specified B group vitamins: Secondary | ICD-10-CM

## 2020-12-03 DIAGNOSIS — M545 Low back pain, unspecified: Secondary | ICD-10-CM | POA: Diagnosis not present

## 2020-12-03 DIAGNOSIS — H60501 Unspecified acute noninfective otitis externa, right ear: Secondary | ICD-10-CM

## 2020-12-03 DIAGNOSIS — G8929 Other chronic pain: Secondary | ICD-10-CM

## 2020-12-03 DIAGNOSIS — R202 Paresthesia of skin: Secondary | ICD-10-CM

## 2020-12-03 DIAGNOSIS — H6123 Impacted cerumen, bilateral: Secondary | ICD-10-CM

## 2020-12-03 MED ORDER — GABAPENTIN 100 MG PO CAPS: 100.0000 mg | ORAL_CAPSULE | Freq: Every day | ORAL | 2 refills | Status: DC

## 2020-12-03 MED ORDER — CIPROFLOXACIN-DEXAMETHASONE 0.3-0.1 % OT SUSP: 4.0000 [drp] | Freq: Two times a day (BID) | OTIC | 0 refills | Status: DC

## 2020-12-03 MED ORDER — CIPROFLOXACIN-DEXAMETHASONE 0.3-0.1 % OT SUSP: 4.0000 [drp] | Freq: Two times a day (BID) | OTIC | 0 refills | Status: AC

## 2020-12-03 NOTE — Patient Instructions (Addendum)
A few things to remember from today's visit:  Chronic bilateral low back pain, unspecified whether sciatica present - Plan: DG Lumbar Spine Complete  Paresthesia of both lower extremities - Plan: gabapentin (NEURONTIN) 100 MG capsule  Bilateral hearing loss due to cerumen impaction  Acute otitis externa of right ear, unspecified type - Plan: ciprofloxacin-dexamethasone (CIPRODEX) OTIC suspension  If you need refills please call your pharmacy. Do not use My Chart to request refills or for acute issues that need immediate attention.   Avoid right hearing aid for at least a week to let ear heal. Gabapentin at bedtime to help with nerve pain.  Please be sure medication list is accurate. If a new problem present, please set up appointment sooner than planned today.   Peripheral Neuropathy Peripheral neuropathy is a type of nerve damage. It affects nerves that carry signals between the spinal cord and the arms, legs, and the rest of the body (peripheral nerves). It does not affect nerves in the spinal cord or brain. In peripheral neuropathy, one nerve or a group of nerves may be damaged. Peripheral neuropathy is a broad category that includes many specific nerve disorders, like diabetic neuropathy, hereditary neuropathy, and carpal tunnel syndrome. What are the causes? This condition may be caused by: Diabetes. This is the most common cause of peripheral neuropathy. Nerve injury. Pressure or stress on a nerve that lasts a long time. Lack (deficiency) of B vitamins. This can result from alcoholism, poor diet, or a restricted diet. Infections. Autoimmune diseases, such as rheumatoid arthritis and systemic lupus erythematosus. Nerve diseases that are passed from parent to child (inherited). Some medicines, such as cancer medicines (chemotherapy). Poisonous (toxic) substances, such as lead and mercury. Too little blood flowing to the legs. Kidney disease. Thyroid disease. In some cases, the  cause of this condition is not known. What are the signs or symptoms? Symptoms of this condition depend on which of your nerves is damaged. Common symptoms include: Loss of feeling (numbness) in the feet, hands, or both. Tingling in the feet, hands, or both. Burning pain. Very sensitive skin. Weakness. Not being able to move a part of the body (paralysis). Muscle twitching. Clumsiness or poor coordination. Loss of balance. Not being able to control your bladder. Feeling dizzy. Sexual problems. How is this diagnosed? Diagnosing and finding the cause of peripheral neuropathy can be difficult. Your health care provider will take your medical history and do a physical exam. A neurological exam will also be done. This involves checking things that are affected by your brain, spinal cord, and nerves (nervous system). For example, your health care provider will check your reflexes, how you move, and what you can feel. You may have other tests, such as: Blood tests. Electromyogram (EMG) and nerve conduction tests. These tests check nerve function and how well the nerves are controlling the muscles. Imaging tests, such as CT scans or MRI to rule out other causes of your symptoms. Removing a small piece of nerve to be examined in a lab (nerve biopsy). Removing and examining a small amount of the fluid that surrounds the brain and spinal cord (lumbar puncture). How is this treated? Treatment for this condition may involve: Treating the underlying cause of the neuropathy, such as diabetes, kidney disease, or vitamin deficiencies. Stopping medicines that can cause neuropathy, such as chemotherapy. Medicine to help relieve pain. Medicines may include: Prescription or over-the-counter pain medicine. Antiseizure medicine. Antidepressants. Pain-relieving patches that are applied to painful areas of skin. Surgery to  relieve pressure on a nerve or to destroy a nerve that is causing pain. Physical  therapy to help improve movement and balance. Devices to help you move around (assistive devices). Follow these instructions at home: Medicines Take over-the-counter and prescription medicines only as told by your health care provider. Do not take any other medicines without first asking your health care provider. Do not drive or use heavy machinery while taking prescription pain medicine. Lifestyle  Do not use any products that contain nicotine or tobacco, such as cigarettes and e-cigarettes. Smoking keeps blood from reaching damaged nerves. If you need help quitting, ask your health care provider. Avoid or limit alcohol. Too much alcohol can cause a vitamin B deficiency, and vitamin B is needed for healthy nerves. Eat a healthy diet. This includes: Eating foods that are high in fiber, such as fresh fruits and vegetables, whole grains, and beans. Limiting foods that are high in fat and processed sugars, such as fried or sweet foods. General instructions  If you have diabetes, work closely with your health care provider to keep your blood sugar under control. If you have numbness in your feet: Check every day for signs of injury or infection. Watch for redness, warmth, and swelling. Wear padded socks and comfortable shoes. These help protect your feet. Develop a good support system. Living with peripheral neuropathy can be stressful. Consider talking with a mental health specialist or joining a support group. Use assistive devices and attend physical therapy as told by your health care provider. This may include using a walker or a cane. Keep all follow-up visits as told by your health care provider. This is important. Contact a health care provider if: You have new signs or symptoms of peripheral neuropathy. You are struggling emotionally from dealing with peripheral neuropathy. Your pain is not well-controlled. Get help right away if: You have an injury or infection that is not healing  normally. You develop new weakness in an arm or leg. You have fallen or do so frequently. Summary Peripheral neuropathy is when the nerves in the arms, or legs are damaged, resulting in numbness, weakness, or pain. There are many causes of peripheral neuropathy, including diabetes, pinched nerves, vitamin deficiencies, autoimmune disease, and hereditary conditions. Diagnosing and finding the cause of peripheral neuropathy can be difficult. Your health care provider will take your medical history, do a physical exam, and do tests, including blood tests and nerve function tests. Treatment involves treating the underlying cause of the neuropathy and taking medicines to help control pain. Physical therapy and assistive devices may also help. This information is not intended to replace advice given to you by your health care provider. Make sure you discuss any questions you have with your health care provider. Document Revised: 10/09/2019 Document Reviewed: 10/09/2019 Elsevier Patient Education  2022 [employer].

## 2020-12-12 ENCOUNTER — Telehealth: Payer: Self-pay

## 2020-12-12 NOTE — Telephone Encounter (Signed)
I left patient a voicemail to call back to schedule her Prolia injection.  - okay to schedule at any time. - estimated $20 admin fee.

## 2020-12-16 NOTE — Telephone Encounter (Signed)
Pt is sch for 12-22-2020 at 10am

## 2020-12-22 ENCOUNTER — Ambulatory Visit (INDEPENDENT_AMBULATORY_CARE_PROVIDER_SITE_OTHER): Payer: Medicare HMO

## 2020-12-22 DIAGNOSIS — M810 Age-related osteoporosis without current pathological fracture: Secondary | ICD-10-CM | POA: Diagnosis not present

## 2020-12-22 MED ORDER — DENOSUMAB 60 MG/ML ~~LOC~~ SOSY
60.0000 mg | PREFILLED_SYRINGE | Freq: Once | SUBCUTANEOUS | Status: AC
Start: 2020-12-22 — End: 2020-12-22
  Administered 2020-12-22: 60 mg via SUBCUTANEOUS

## 2020-12-22 NOTE — Progress Notes (Signed)
Per orders of Dr. Martinique, injection of Denosumab 60 mg/ml  given by Saajan Willmon L Holli Rengel. Patient tolerated injection well.

## 2021-01-30 NOTE — Progress Notes (Signed)
HPI: Tanya Swanson is a 81 y.o. female, who is here today to follow on recent visit. She was last seen on 12/03/20, when she was c/o LE pain when she is in bed at night. She has no pain with ambulation. This is a chronic problem. No numbness, tingling, or weakness. Burning like sensation,7/10. Gabapentin 100 mg was started, she is taking 1 caps daily.  Medication has helped with pretibial burning sensation, "a lot" but still having anterior thigh achy pain. No side effects reported.  Hx of lower back pain and generalized OA. Lower back pain is not radiated, it is mainly at night when she is in bed. She takes a Tylenol at bedtime, which helps. Problem has been stable for years. Negative for fever, abnormal weight loss, night sweats, abdominal pain, changes in bowel habits, or urinary symptoms.  Lumbar x-ray done on 12/03/2020 question of vertebral body height loss of T12 vertebral body, degenerative changes, and aortic atherosclerosis.  Component     Latest Ref Rng & Units 09/18/2019  Cholesterol     0 - 200 mg/dL 177  HDL Cholesterol     >39.00 mg/dL 58  Triglycerides     0.0 - 149.0 mg/dL 125  LDL Cholesterol (Calc)     mg/dL (calc) 96  Total CHOL/HDL Ratio      3.1  Non-HDL Cholesterol (Calc)     <130 mg/dL (calc) 119   B12 deficiency: She completed B12 1000 mcg weekly x4. She is on B12 1000 mcg daily.  Lab Results  Component Value Date   VITAMINB12 147 (L) 09/23/2020   Today she is c/o right jaw achy pain that started suddenly while she was in bed 3-4 days ago. She has already seen her dentist and was told she needed a root canal done.She was referred to oral surgeon. Exacerbated by chewing on affected side.  She wonders if pain can be caused by any of her medications, concerned about Prolia side effects. No hx of trauma and no skin changes. She took Advil x 1 which helped.  Review of Systems  Constitutional:  Negative for activity change, appetite  change and chills.  HENT:  Negative for mouth sores and sore throat.   Respiratory:  Negative for cough, shortness of breath and wheezing.   Cardiovascular:  Negative for chest pain, palpitations and leg swelling.  Gastrointestinal:  Negative for nausea and vomiting.  Genitourinary:  Negative for decreased urine volume, dysuria and hematuria.  Skin:  Negative for pallor and rash.  Neurological:  Negative for syncope and weakness.  Psychiatric/Behavioral:  Negative for confusion.   Rest see pertinent positives and negatives per HPI.  Current Outpatient Medications on File Prior to Visit  Medication Sig Dispense Refill   Acetaminophen (TYLENOL PO) Take 500 mg by mouth as needed.     Biotin 5 MG TABS Take by mouth.     Calcium-Vitamin D-Vitamin K [id number] MG-UNT-MCG CHEW Chew by mouth.     Cholecalciferol (VITAMIN D3) 50 MCG (2000 UT) capsule Take by mouth.     cycloSPORINE (RESTASIS) 0.05 % ophthalmic emulsion Place 1 drop into both eyes 2 (two) times daily.     Famotidine (PEPCID PO) Take by mouth daily.     polyethylene glycol (MIRALAX / GLYCOLAX) packet Take 17 g by mouth daily as needed. For constipation.     cetirizine (ZYRTEC) 5 MG tablet Take by mouth.     No current facility-administered medications on file prior to visit.    Past Medical  History:  Diagnosis Date   Acid reflux    Allergy    Arthritis    in fingers   Blood in stool    Chicken pox    Chronic kidney disease    UTI and kidneystones   Constipation    unknown urology procedure 2006   Hiatal hernia    Kidney stones    Pneumonia 1968   Urinary tract infection    Allergies  Allergen Reactions   Penicillins Other (See Comments)    Has patient had a PCN reaction causing immediate rash, facial/tongue/throat swelling, SOB or lightheadedness with hypotension: Yes Has patient had a PCN reaction causing severe rash involving mucus membranes or skin necrosis: No Has patient had a PCN reaction that required  hospitalization No Has patient had a PCN reaction occurring within the last 10 years: No If all of the above answers are "NO", then may proceed with Cephalosporin use. Passed out Has patient had a PCN reaction causing immediate rash, facial/tongue/throat swelling, SOB or lightheadedness with hypotension: Yes Has patient had a PCN reaction causing severe rash involving mucus membranes or skin necrosis: No Has patient had a PCN reaction that required hospitalization No Has patient had a PCN reaction occurring within the last 10 years: No If all of the above answers are "NO", then may proceed with Cephalosporin use. Passed out fainted Has patient had a PCN reaction causing immediate rash, facial/tongue/throat swelling, SOB or lightheadedness with hypotension: Yes Has patient had a PCN reaction causing severe rash involving mucus membranes or skin necrosis: No Has patient had a PCN reaction that required hospitalization No Has patient had a PCN reaction occurring within the last 10 years: No If all of the above answers are "NO", then may proceed with Cephalosporin use. Passed out fainted Has patient had a PCN reaction causing immediate rash, facial/tongue/throat swelling, SOB or lightheadedness with hypotension: Yes Has patient had a PCN reaction causing severe rash involving mucus membranes or skin necrosis: No Has patient had a PCN reaction that required hospitalization No Has patient had a PCN reaction occurring within the last 10 years: No If all of the above answers are "NO", then may proceed with Cephalosporin use. Passed out Has patient had a PCN reaction causing immediate rash, facial/tongue/throat swelling, SOB or lightheadedness with hypotension: Yes Has patient had a PCN reaction causing severe rash involving mucus membranes or skin necrosis: No Has patient had a PCN reaction that required hospitalization No Has patient had a PCN reaction occurring within the last 10 years: No If  all of the above answers are "NO", then may proceed with Cephalosporin use. Passed out fainted    Social History   Socioeconomic History   Marital status: Married    Spouse name: Not on file   Number of children: Not on file   Years of education: Not on file   Highest education level: Not on file  Occupational History   Not on file  Tobacco Use   Smoking status: Never   Smokeless tobacco: Never  Substance and Sexual Activity   Alcohol use: No   Drug use: No   Sexual activity: Not on file  Other Topics Concern   Not on file  Social History Narrative   Not on file   Social Determinants of Health   Financial Resource Strain: Low Risk    Difficulty of Paying Living Expenses: Not hard at all  Food Insecurity: No Food Insecurity   Worried About Charity fundraiser in  the Last Year: Never true   Ran Out of Food in the Last Year: Never true  Transportation Needs: No Transportation Needs   Lack of Transportation (Medical): No   Lack of Transportation (Non-Medical): No  Physical Activity: Insufficiently Active   Days of Exercise per Week: 3 days   Minutes of Exercise per Session: 30 min  Stress: No Stress Concern Present   Feeling of Stress : Not at all  Social Connections: Socially Integrated   Frequency of Communication with Friends and Family: Twice a week   Frequency of Social Gatherings with Friends and Family: Twice a week   Attends Religious Services: More than 4 times per year   Active Member of [employer] or Organizations: Yes   Attends Archivist Meetings: 1 to 4 times per year   Marital Status: Married    Vitals:   02/02/21 0947  BP: 126/80  Pulse: 76  Resp: 16  SpO2: 98%   Body mass index is 26.69 kg/m.  Physical Exam Vitals and nursing note reviewed.  Constitutional:      General: She is not in acute distress.    Appearance: She is well-developed.  HENT:     Head: Normocephalic and atraumatic.     Mouth/Throat:     Mouth: Mucous membranes  are moist.     Pharynx: Oropharynx is clear.     Comments: Mild tenderness upon palpation along gum right lower side. I do not appreciate edema,erythema,or masses. No tenderness upon palpation of mandibular bone no deformities on inspection. Mouth opening is normal and does not elicit pain. Eyes:     Conjunctiva/sclera: Conjunctivae normal.  Cardiovascular:     Rate and Rhythm: Normal rate and regular rhythm.     Heart sounds: No murmur heard.    Comments: DP and PT pulses present bilateral. Pulmonary:     Effort: Pulmonary effort is normal. No respiratory distress.     Breath sounds: Normal breath sounds.  Abdominal:     Palpations: Abdomen is soft. There is no hepatomegaly or mass.     Tenderness: There is no abdominal tenderness.  Musculoskeletal:     Thoracic back: Deformity (Kyphosis) present.     Lumbar back: No tenderness or bony tenderness.  Lymphadenopathy:     Cervical: No cervical adenopathy.  Skin:    General: Skin is warm.     Findings: No erythema or rash.  Neurological:     General: No focal deficit present.     Mental Status: She is alert and oriented to person, place, and time.     Cranial Nerves: No cranial nerve deficit.     Gait: Gait normal.  Psychiatric:     Comments: Well groomed, good eye contact.   ASSESSMENT AND PLAN:  Tanya Swanson was seen today for follow-up.  Diagnoses and all orders for this visit: Orders Placed This Encounter  Procedures   Lipid panel   Vitamin B12   Lab Results  Component Value Date   GBTDVVOH60 737 (H) 02/02/2021   Lab Results  Component Value Date   CHOL 163 02/02/2021   HDL 55.70 02/02/2021   LDLCALC 76 02/02/2021   TRIG 157.0 (H) 02/02/2021   CHOLHDL 3 02/02/2021   Pain in lower jaw Following with dentist, apparently she needs a root canal, which could be causing pain. We discussed some side effects of Prolia and there is a risk of jaw osteonecrosis, which is rare. Pain has improved. We will wait until she  sees oral surgeon.  Instituted to let provider know she is on Prolia.  B12 deficiency Continue B12 1000 mcg daily. Further recommendation will be given according to B12 results.  Atherosclerosis of aorta ([place]) We discussed lumbar imaging findings. She prefers to hold on statins, we discussed CV benefits. Further recommendations according to lipid panel (not fasting).   Chronic lower back pain Continue Tylenol 500 mg at bedtime. Avoid activities that can aggravate pain.  Paresthesia of both lower extremities Distal burning sensation greatly improved. Still having thigh achy pain, which could be radicular. She agrees with trying higher dose of gabapentin, 200 mg. Some side effects discussed. Fall precautions.  Return in about 8 months (around 10/03/2021) for CPE.  Tegh Franek G. [place], MD  [facility]. [place] [facility].

## 2021-02-02 ENCOUNTER — Ambulatory Visit (INDEPENDENT_AMBULATORY_CARE_PROVIDER_SITE_OTHER): Payer: Medicare HMO | Admitting: Family Medicine

## 2021-02-02 ENCOUNTER — Encounter: Payer: Self-pay | Admitting: Family Medicine

## 2021-02-02 VITALS — BP 126/80 | HR 76 | Resp 16 | Ht 66.0 in | Wt 165.4 lb

## 2021-02-02 DIAGNOSIS — G8929 Other chronic pain: Secondary | ICD-10-CM

## 2021-02-02 DIAGNOSIS — I700 Atherosclerosis of aorta: Secondary | ICD-10-CM

## 2021-02-02 DIAGNOSIS — M545 Low back pain, unspecified: Secondary | ICD-10-CM

## 2021-02-02 DIAGNOSIS — R6884 Jaw pain: Secondary | ICD-10-CM

## 2021-02-02 DIAGNOSIS — R202 Paresthesia of skin: Secondary | ICD-10-CM | POA: Diagnosis not present

## 2021-02-02 DIAGNOSIS — E538 Deficiency of other specified B group vitamins: Secondary | ICD-10-CM | POA: Diagnosis not present

## 2021-02-02 DIAGNOSIS — G629 Polyneuropathy, unspecified: Secondary | ICD-10-CM | POA: Insufficient documentation

## 2021-02-02 LAB — LIPID PANEL
Cholesterol: 163 mg/dL (ref 0–200)
HDL: 55.7 mg/dL (ref >39.00)
LDL Cholesterol: 76 mg/dL (ref 0–99)
NonHDL: 107.1
Total CHOL/HDL Ratio: 3
Triglycerides: 157 mg/dL — ABNORMAL HIGH (ref 0.0–149.0)
VLDL: 31.4 mg/dL (ref 0.0–40.0)

## 2021-02-02 LAB — VITAMIN B12: Vitamin B-12: 965 pg/mL — ABNORMAL HIGH (ref 211–911)

## 2021-02-02 MED ORDER — GABAPENTIN 100 MG PO CAPS: 200.0000 mg | ORAL_CAPSULE | Freq: Every day | ORAL | 2 refills | Status: DC

## 2021-02-02 NOTE — Assessment & Plan Note (Signed)
Distal burning sensation greatly improved. Still having thigh achy pain, which could be radicular. She agrees with trying higher dose of gabapentin, 200 mg. Some side effects discussed. Fall precautions.

## 2021-02-02 NOTE — Assessment & Plan Note (Signed)
Continue Tylenol 500 mg at bedtime. Avoid activities that can aggravate pain.

## 2021-02-02 NOTE — Assessment & Plan Note (Signed)
We discussed lumbar imaging findings. She prefers to hold on statins, we discussed CV benefits. Further recommendations according to lipid panel (not fasting).

## 2021-02-02 NOTE — Patient Instructions (Addendum)
A few things to remember from today's visit:  B12 deficiency - Plan: Vitamin B12  Paresthesia of both lower extremities - Plan: gabapentin (NEURONTIN) 100 MG capsule  Atherosclerosis of aorta ([place]) - Plan: Lipid panel  Pain in lower jaw  If you need refills please call your pharmacy. Do not use My Chart to request refills or for acute issues that need immediate attention.   Let's try increasing Gabapentin from 1 cap to 2 caps at bedtime if no major improvement in 4 weeks you can go down to 1 caps at bedtime. Let me know if 2 caps help.  Continue Tylenol at bedtime. Keep appt with dentist.  Please be sure medication list is accurate. If a new problem present, please set up appointment sooner than planned today.  Atherosclerosis Atherosclerosis is when plaque builds up in the arteries. This causes narrowing and hardening of the arteries. Arteries are blood vessels that carry blood from the heart to all parts of the body. This blood contains oxygen. Plaque occurs due to inflammation or from a buildup of fat, cholesterol, calcium, waste products of cells, and a clotting material in the blood (fibrin). Plaque decreases the amount of blood that can flow through the artery. Atherosclerosis can affect any artery in your body, including: Heart arteries. Damage to these arteries may lead to coronary artery disease, which can cause a heart attack. Brain arteries. Damage to these arteries may cause a stroke. Leg, arm, and pelvis arteries. Peripheral artery disease (PAD) may result from damage to these arteries. Kidney arteries. Kidney (renal) failure may result from damage to kidney arteries. Treatment may slow the disease and prevent further damage to your heart, brain, peripheral arteries, and kidneys. What are the causes? This condition develops slowly over many years. The inner layers of your arteries become damaged and allow the gradual buildup of plaque. The exact cause of atherosclerosis is  not fully understood. Symptoms of atherosclerosis do not occur until an artery becomes narrow or blocked. What increases the risk? The following factors may make you more likely to develop this condition: Being middle-aged or older. Certain medical conditions, including: High blood pressure. High cholesterol. High blood fats (triglycerides). Diabetes. Sleep apnea. Obesity. Certain lab levels, including: Elevated C-reactive protein (CRP). This is a sign of increased inflammation in your body. Elevated homocysteine levels. This is an amino acid that is associated with heart and blood vessel disease. Using tobacco or nicotine products. A family history of atherosclerosis. Not exercising enough (sedentary lifestyle). Being stressed. Drinking too much alcohol or using drugs, such as cocaine or methamphetamine. What are the signs or symptoms? Symptoms of atherosclerosis do not occur until the plaque severely narrows or blocks the artery, which decreases blood flow. Sometimes, atherosclerosis does not cause symptoms. Symptoms of this condition include: Coronary artery disease. This may cause chest pain and shortness of breath. Decreased blood supply to your brain, which may cause a stroke. Signs of a stroke may include sudden: Weakness or numbness in your face, arm, or leg, especially on one side of your body. Trouble walking or difficulty moving your arms or legs. Loss of balance or coordination. Confusion. Slurred speech. Trouble speaking, or trouble understanding speech, or both (aphasia). Vision changes in one or both eyes. This may be double vision, blurred vision, or loss of vision. Severe headache with no known cause. The headache is often described as the worst headache ever experienced. PAD, which may cause pain, numbness, or nonhealing wounds, often in your legs and hips.  Renal failure. This may cause tiredness, problems with urination, swelling, and itchy skin. How is this  diagnosed? This condition is diagnosed based on your medical history and a physical exam. During the exam, your health care provider will: Check your pulse in different places. Listen for a "whooshing" sound over your arteries (bruit). You may also have tests, such as: Blood tests to check your levels of cholesterol, triglycerides, blood sugar, and CRP. Ankle-brachial index to compare blood pressure in your arms to blood pressure in your ankles to see how your blood is flowing. Heart (cardiac) tests. Electrocardiogram (ECG) to check for heart damage. Stress test to see how your heart reacts to exercise. Ultrasound tests. Ultrasound of your peripheral arteries to check blood flow. Echocardiogram to get images of your heart's chambers and valves. X-ray tests. Chest X-ray to see if you have an enlarged heart, which is a sign of heart failure. CT scan to check for damage to your heart, brain, or arteries. Angiogram. This is a test where dye is injected and X-rays are used to see the blood flow in the arteries. How is this treated? This condition is treated with lifestyle changes as the first step. These may include: Changing your diet. Losing weight. Reducing stress. Exercising and being physically active more regularly. Quitting smoking. You may also need medicine to: Lower triglycerides and cholesterol. Control blood pressure. Prevent blood clots. Lower inflammation in your body. Control your blood sugar. Sometimes, surgery is needed to: Remove plaque from an artery (endarterectomy). Open or widen a narrowed heart artery or peripheral artery (angioplasty). Create a new path for your blood with one of these procedures: Heart (coronary) artery bypass graft surgery. Peripheral artery bypass graft surgery. Place a small mesh tube (stent) in an artery to open or widen a narrowed artery. Follow these instructions at home: Eating and drinking  Eat a heart-healthy diet. Talk with your  health care provider or a dietitian if you need help. A heart-healthy diet involves: Limiting unhealthy fats and increasing healthy fats. Some examples of healthy fats are avocados and olive oil. Eating plant-based foods, such as fruits, vegetables, nuts, whole grains, and legumes (such as peas and lentils). If you drink alcohol: Limit how much you have to: 0-1 drink a day for women who are not pregnant. 0-2 drinks a day for men. Know how much alcohol is in a drink. In the [place] one drink equals one 12 oz bottle of beer (355 mL), one 5 oz glass of wine (148 mL), or one 1 oz glass of hard liquor (44 mL). Lifestyle  Maintain a healthy weight. Lose weight if your health care provider says that you need to do that. Follow an exercise program as told by your health care provider. Do not use any products that contain nicotine or tobacco. These products include cigarettes, chewing tobacco, and vaping devices, such as e-cigarettes. If you need help quitting, ask your health care provider. Do not use drugs. General instructions Take over-the-counter and prescription medicines only as told by your health care provider. Manage other health conditions as told. Keep all follow-up visits. This is important. Contact a health care provider if you have: An irregular heartbeat. Unexplained tiredness (fatigue). Trouble urinating, or you are producing less urine or foamy urine. Swelling of your hands or feet, or itchy skin. Unexplained pain or numbness in your legs or hips. A wound that is slow to heal or is not healing. Get help right away if: You have any symptoms of a  heart attack. These may be: Chest pain. This includes squeezing chest pain that may feel like indigestion (angina). Shortness of breath. Pain in your neck, jaw, arms, back, or stomach. Cold sweat. Nausea. Light-headedness. Sudden pain, numbness, or coldness in a limb. You have any symptoms of a stroke. "BE FAST" is an easy way to  remember the main warning signs of a stroke: B - Balance. Signs are dizziness, sudden trouble walking, or loss of balance. E - Eyes. Signs are trouble seeing or a sudden change in vision. F - Face. Signs are sudden weakness or numbness of the face, or the face or eyelid drooping on one side. A - Arms. Signs are weakness or numbness in an arm. This happens suddenly and usually on one side of the body. S - Speech. Signs are sudden trouble speaking, slurred speech, or trouble understanding what people say. T - Time. Time to call emergency services. Write down what time symptoms started. You have other signs of a stroke, such as: A sudden, severe headache with no known cause. Nausea or vomiting. Seizure. These symptoms may represent a serious problem that is an emergency. Do not wait to see if the symptoms will go away. Get medical help right away. Call your local emergency services (911 in the [place] Do not drive yourself to the hospital. Summary Atherosclerosis is when plaque builds up in the arteries and causes narrowing and hardening of the arteries. Plaque occurs due to inflammation or from a buildup of fat, cholesterol, calcium, cellular waste products, and fibrin. This condition may not cause any symptoms. Symptoms of atherosclerosis do not occur until the plaque severely narrows or blocks the artery. Treatment starts with lifestyle changes and may include medicines. In some cases, surgery is needed. Get help right away if you have any symptoms of a heart attack or stroke. This information is not intended to replace advice given to you by your health care provider. Make sure you discuss any questions you have with your health care provider. Document Revised: 04/02/2020 Document Reviewed: 04/02/2020 Elsevier Patient Education  [place].

## 2021-02-03 ENCOUNTER — Encounter: Payer: Self-pay | Admitting: Family Medicine

## 2021-02-05 ENCOUNTER — Telehealth: Payer: Self-pay | Admitting: Family Medicine

## 2021-02-05 MED ORDER — ATORVASTATIN CALCIUM 10 MG PO TABS: 10.0000 mg | ORAL_TABLET | Freq: Every day | ORAL | 3 refills | Status: DC

## 2021-02-05 NOTE — Telephone Encounter (Signed)
Pt is calling and she is ok with trying the  rx for low dose chole med please sent to  [id number] - [place], [place] - [address] Phone:  [phone]  Fax:  [phone]

## 2021-02-05 NOTE — Telephone Encounter (Signed)
Rx has been sent in. 

## 2021-03-12 ENCOUNTER — Ambulatory Visit (INDEPENDENT_AMBULATORY_CARE_PROVIDER_SITE_OTHER): Payer: Medicare HMO

## 2021-03-12 ENCOUNTER — Ambulatory Visit: Payer: Medicare HMO | Admitting: Adult Health

## 2021-03-12 ENCOUNTER — Other Ambulatory Visit: Payer: Self-pay

## 2021-03-12 ENCOUNTER — Encounter: Payer: Self-pay | Admitting: Adult Health

## 2021-03-12 VITALS — BP 110/80 | HR 86 | Temp 98.5°F | Ht 66.0 in | Wt 168.0 lb

## 2021-03-12 DIAGNOSIS — M25551 Pain in right hip: Secondary | ICD-10-CM

## 2021-03-12 DIAGNOSIS — M25552 Pain in left hip: Secondary | ICD-10-CM

## 2021-03-12 MED ORDER — METHYLPREDNISOLONE 4 MG PO TBPK
ORAL_TABLET | ORAL | 0 refills | Status: DC
Start: 2021-03-12 — End: 2021-05-04

## 2021-03-12 NOTE — Progress Notes (Signed)
? ?Subjective:  ? ? Patient ID: Tanya Swanson, female    DOB: [birth year], 81 y.o.   MRN: [id number] ? ?HPI ?81 year old female who  has a past medical history of Acid reflux, Allergy, Arthritis, Blood in stool, Chicken pox, Chronic kidney disease, Constipation, Hiatal hernia, Kidney stones, Pneumonia (1968), and Urinary tract infection. ? ?She is a patient of Dr. Martinique who I am seeing today for an acute issue of bilateral hip pain that started about 1.5 weeks ago. Pain is worse with walking and changing positions ? ?Did start a statin about 2 months ago and she is wondering if this is the cause of the pain. She has no muscle pain in her thighs .  ? ?Denies falls or trauma. Has been taking tylenol without relief.  ? ? ?Review of Systems ?See HPI  ? ?Past Medical History:  ?Diagnosis Date  ? Acid reflux   ? Allergy   ? Arthritis   ? in fingers  ? Blood in stool   ? Chicken pox   ? Chronic kidney disease   ? UTI and kidneystones  ? Constipation   ? unknown urology procedure 2006  ? Hiatal hernia   ? Kidney stones   ? Pneumonia 1968  ? Urinary tract infection   ? ? ?Social History  ? ?Socioeconomic History  ? Marital status: Married  ?  Spouse name: Not on file  ? Number of children: Not on file  ? Years of education: Not on file  ? Highest education level: Not on file  ?Occupational History  ? Not on file  ?Tobacco Use  ? Smoking status: Never  ? Smokeless tobacco: Never  ?Substance and Sexual Activity  ? Alcohol use: No  ? Drug use: No  ? Sexual activity: Not on file  ?Other Topics Concern  ? Not on file  ?Social History Narrative  ? Not on file  ? ?Social Determinants of Health  ? ?Financial Resource Strain: Low Risk   ? Difficulty of Paying Living Expenses: Not hard at all  ?Food Insecurity: No Food Insecurity  ? Worried About Char[employer] fundraiser in the Last Year: Never true  ? Ran Out of Food in the Last Year: Never true  ?Transportation Needs: No Transportation Needs  ? Lack of Transportation (Medical): No   ? Lack of Transportation (Non-Medical): No  ?Physical Activity: Insufficiently Active  ? Days of Exercise per Week: 3 days  ? Minutes of Exercise per Session: 30 min  ?Stress: No Stress Concern Present  ? Feeling of Stress : Not at all  ?Social Connections: Socially Integrated  ? Frequency of Communication with Friends and Family: Twice a week  ? Frequency of Social Gatherings with Friends and Family: Twice a week  ? Attends Religious Services: More than 4 times per year  ? Active Member of Clubs or Organizations: Yes  ? Attends Archivist Meetings: 1 to 4 times per year  ? Marital Status: Married  ?Intimate Partner Violence: Not At Risk  ? Fear of Current or Ex-Partner: No  ? Emotionally Abused: No  ? Physically Abused: No  ? Sexually Abused: No  ? ? ?Past Surgical History:  ?Procedure Laterality Date  ? CATARACT EXTRACTION W/PHACO Right 02/24/2015  ? Procedure: CATARACT EXTRACTION PHACO AND INTRAOCULAR LENS PLACEMENT RIGHT EYE CDE=8.01;  Surgeon: Tonny Branch, MD;  Location: [facility];  Service: Ophthalmology;  Laterality: Right;  ? CATARACT EXTRACTION W/PHACO Left 04/03/2015  ? Procedure: CATARACT EXTRACTION PHACO  AND INTRAOCULAR LENS PLACEMENT (IOC);  Surgeon: Tonny Branch, MD;  Location: [facility];  Service: Ophthalmology;  Laterality: Left;  CDE:11.74  ? DILATION AND CURETTAGE OF UTERUS    ? LITHOTRIPSY    ? TONSILLECTOMY  1951  ? ? ?Family History  ?Problem Relation Age of Onset  ? Breast cancer Maternal Aunt   ? Arthritis Mother   ? Hypertension Mother   ? Arthritis Father   ? Hypertension Father   ? ? ?Allergies  ?Allergen Reactions  ? Penicillins Other (See Comments)  ?  Has patient had a PCN reaction causing immediate rash, facial/tongue/throat swelling, SOB or lightheadedness with hypotension: Yes ?Has patient had a PCN reaction causing severe rash involving mucus membranes or skin necrosis: No ?Has patient had a PCN reaction that required hospitalization No ?Has patient had a PCN reaction occurring  within the last 10 years: No ?If all of the above answers are "NO", then may proceed with Cephalosporin use. ?Passed out ?Has patient had a PCN reaction causing immediate rash, facial/tongue/throat swelling, SOB or lightheadedness with hypotension: Yes ?Has patient had a PCN reaction causing severe rash involving mucus membranes or skin necrosis: No ?Has patient had a PCN reaction that required hospitalization No ?Has patient had a PCN reaction occurring within the last 10 years: No ?If all of the above answers are "NO", then may proceed with Cephalosporin use. ?Passed out ?fainted ?Has patient had a PCN reaction causing immediate rash, facial/tongue/throat swelling, SOB or lightheadedness with hypotension: Yes ?Has patient had a PCN reaction causing severe rash involving mucus membranes or skin necrosis: No ?Has patient had a PCN reaction that required hospitalization No ?Has patient had a PCN reaction occurring within the last 10 years: No ?If all of the above answers are "NO", then may proceed with Cephalosporin use. ?Passed out ?fainted ?Has patient had a PCN reaction causing immediate rash, facial/tongue/throat swelling, SOB or lightheadedness with hypotension: Yes ?Has patient had a PCN reaction causing severe rash involving mucus membranes or skin necrosis: No ?Has patient had a PCN reaction that required hospitalization No ?Has patient had a PCN reaction occurring within the last 10 years: No ?If all of the above answers are "NO", then may proceed with Cephalosporin use. ?Passed out ?Has patient had a PCN reaction causing immediate rash, facial/tongue/throat swelling, SOB or lightheadedness with hypotension: Yes ?Has patient had a PCN reaction causing severe rash involving mucus membranes or skin necrosis: No ?Has patient had a PCN reaction that required hospitalization No ?Has patient had a PCN reaction occurring within the last 10 years: No ?If all of the above answers are "NO", then may proceed with  Cephalosporin use. ?Passed out ?fainted  ? ? ?Current Outpatient Medications on File Prior to Visit  ?Medication Sig Dispense Refill  ? Acetaminophen (TYLENOL PO) Take 500 mg by mouth as needed.    ? atorvastatin (LIPITOR) 10 MG tablet Take 1 tablet (10 mg total) by mouth daily. 30 tablet 3  ? Biotin 5 MG TABS Take by mouth.    ? Calcium-Vitamin D-Vitamin K 102-725-36 MG-UNT-MCG CHEW Chew by mouth.    ? Cholecalciferol (VITAMIN D3) 50 MCG (2000 UT) capsule Take by mouth.    ? cycloSPORINE (RESTASIS) 0.05 % ophthalmic emulsion Place 1 drop into both eyes 2 (two) times daily.    ? Famotidine (PEPCID PO) Take by mouth daily.    ? gabapentin (NEURONTIN) 100 MG capsule Take 2 capsules (200 mg total) by mouth at bedtime. 60 capsule 2  ?  polyethylene glycol (MIRALAX / GLYCOLAX) packet Take 17 g by mouth daily as needed. For constipation.    ? cetirizine (ZYRTEC) 5 MG tablet Take by mouth.    ? ?No current facility-administered medications on file prior to visit.  ? ? ?BP 110/80   Pulse 86   Temp 98.5 ?F (36.9 ?C) (Oral)   Ht 5\' 6"  (1.676 m)   Wt 168 lb (76.2 kg)   SpO2 98%   BMI 27.12 kg/m?  ? ? ?   ?Objective:  ? Physical Exam ?Vitals and nursing note reviewed.  ?Constitutional:   ?   Appearance: Normal appearance.  ?Musculoskeletal:     ?   General: No deformity.  ?   Comments: Has mild discomfort with knee to chest and internal/external rotation of hip bilaterally.   ?Skin: ?   General: Skin is warm and dry.  ?   Capillary Refill: Capillary refill takes less than 2 seconds.  ?Neurological:  ?   General: No focal deficit present.  ?   Mental Status: She is alert and oriented to person, place, and time.  ?Psychiatric:     ?   Mood and Affect: Mood normal.     ?   Behavior: Behavior normal.     ?   Thought Content: Thought content normal.     ?   Judgment: Judgment normal.  ? ?   ?Assessment & Plan:  ?1. Bilateral hip pain ?- II do not feel as tough this pain is caused by statin. Likely arthritic pain.  ?- DG Hip  Unilat W OR W/O Pelvis 2-3 Views Right; Future ?- DG Hip Unilat W OR W/O Pelvis 2-3 Views Left; Future ?- methylPREDNISolone (MEDROL DOSEPAK) 4 MG TBPK tablet; Take as directed  Dispense: 21 tablet; Refill: 0 ? ? ?

## 2021-03-12 NOTE — Patient Instructions (Signed)
There are no preventive care reminders to display for this patient. ? ?Depression screen [facility] 2/9 02/02/2021 12/03/2020 06/18/2020  ?Decreased Interest 0 0 0  ?Down, Depressed, Hopeless 1 0 0  ?PHQ - 2 Score 1 0 0  ?Altered sleeping - 0 -  ?Tired, decreased energy - 1 -  ?Change in appetite - 0 -  ?Feeling bad or failure about yourself  - 0 -  ?Trouble concentrating - 0 -  ?Moving slowly or fidgety/restless - 0 -  ?Suicidal thoughts - 0 -  ?PHQ-9 Score - 1 -  ? ? ?

## 2021-05-01 NOTE — Progress Notes (Signed)
?Chief Complaint  ?Patient presents with  ? Follow-up  ?  Cholesterol medication, causing cramps. Tried increasing gabapentin as directed, but had to go back down to 1.   ? ?HPI: ?Tanya Swanson Call is a 81 y.o. female, who is here today with above complaints. ? ?She was last seen on 02/02/2021, when Gabapentin dose was increased from 100 mg to 200 mg.She did not tolerate 2 cap ,so went down to 1 cap at bedtime. ?Gabapentin has helped with pretibial burning sensation. ?Negative for edema or erythema. ? ?She does not feel as steady when she is walking as she was before. She wonders if it is age or Gabapentin. ?But while she has been on Gabapentin she has developed feet burning sensation,started about a month ago. ?She has not noted fever,chills,abnormal wt loss, or skin rash. ? ?Lower back pain , not radiated for years. She takes Tylenol 500 mg x 2 at night. ?Negative for saddle anesthesia or bowel/bladder dysfunction. 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 Otherwise alignment is normal.Intervertebral disc spaces are maintained. ?She is also having cramps, achy thighs, which she attributes to statin.She is on Atorvastatin 10 mg daily. ?She does not think her cholesterol is bad enough for medication. ? ?Atherosclerosis seen on lumbar X ray. ? ?She was also evaluated on 03/12/21 for bilateral hip pain. ?She was treated with oral steroid course and noted that LE pain and feet burning temporarily I proved. ? ?B12 deficiency: She is on oral B12 supplementation, 1000 mcg daily. She wonders if B12 needs to be re-checked. ? ?Lab Results  ?Component Value Date  ? VITAMINB12 965 (H) 02/02/2021  ? ?She is also having more dental issues, wonders if it is Prolia. She has had 3 total. 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 ? ?Review of Systems  ?Constitutional:  Negative for activity change and appetite change.  ?HENT:  Negative for mouth sores and nosebleeds.    ?Respiratory:  Negative for cough, shortness of breath and wheezing.   ?Cardiovascular:  Negative for chest pain, palpitations and leg swelling.  ?Gastrointestinal:  Negative for abdominal pain, nausea and vomiting.  ?     Negative for changes in bowel habits.  ?Genitourinary:  Negative for decreased urine volume, dysuria and hematuria.  ?Musculoskeletal:  Positive for arthralgias and back pain. Negative for gait problem.  ?Neurological:  Negative for syncope, weakness and headaches.  ?Psychiatric/Behavioral:  Negative for confusion. The patient is nervous/anxious.   ?Rest see pertinent positives and negatives per HPI. ? ?Current Outpatient Medications on File Prior to Visit  ?Medication Sig Dispense Refill  ? Acetaminophen (TYLENOL PO) Take 500 mg by mouth as needed.    ? atorvastatin (LIPITOR) 10 MG tablet Take 1 tablet (10 mg total) by mouth daily. 30 tablet 3  ? Biotin 5 MG TABS Take by mouth.    ? Calcium-Vitamin D-Vitamin K [id number] MG-UNT-MCG CHEW Chew by mouth.    ? Cholecalciferol (VITAMIN D3) 50 MCG (2000 UT) capsule Take by mouth.    ? cycloSPORINE (RESTASIS) 0.05 % ophthalmic emulsion Place 1 drop into both eyes 2 (two) times daily.    ? Famotidine (PEPCID PO) Take by mouth daily.    ? polyethylene glycol (MIRALAX / GLYCOLAX) packet Take 17 g by mouth daily as needed. For constipation.    ? cetirizine (ZYRTEC) 5 MG tablet Take by mouth.    ? ?No current facility-administered medications on file prior to visit.  ? ?  Past Medical History:  ?Diagnosis Date  ? Acid reflux   ? Allergy   ? Arthritis   ? in fingers  ? Blood in stool   ? Chicken pox   ? Chronic kidney disease   ? UTI and kidneystones  ? Constipation   ? unknown urology procedure 2006  ? Hiatal hernia   ? Kidney stones   ? Pneumonia 1968  ? Urinary tract infection   ? ?Allergies  ?Allergen Reactions  ? Penicillins Other (See Comments)  ?  Has patient had a PCN reaction causing immediate rash, facial/tongue/throat swelling, SOB or  lightheadedness with hypotension: Yes ?Has patient had a PCN reaction causing severe rash involving mucus membranes or skin necrosis: No ?Has patient had a PCN reaction that required hospitalization No ?Has patient had a PCN reaction occurring within the last 10 years: No ?If all of the above answers are "NO", then may proceed with Cephalosporin use. ?Passed out ?Has patient had a PCN reaction causing immediate rash, facial/tongue/throat swelling, SOB or lightheadedness with hypotension: Yes ?Has patient had a PCN reaction causing severe rash involving mucus membranes or skin necrosis: No ?Has patient had a PCN reaction that required hospitalization No ?Has patient had a PCN reaction occurring within the last 10 years: No ?If all of the above answers are "NO", then may proceed with Cephalosporin use. ?Passed out ?fainted ?Has patient had a PCN reaction causing immediate rash, facial/tongue/throat swelling, SOB or lightheadedness with hypotension: Yes ?Has patient had a PCN reaction causing severe rash involving mucus membranes or skin necrosis: No ?Has patient had a PCN reaction that required hospitalization No ?Has patient had a PCN reaction occurring within the last 10 years: No ?If all of the above answers are "NO", then may proceed with Cephalosporin use. ?Passed out ?fainted ?Has patient had a PCN reaction causing immediate rash, facial/tongue/throat swelling, SOB or lightheadedness with hypotension: Yes ?Has patient had a PCN reaction causing severe rash involving mucus membranes or skin necrosis: No ?Has patient had a PCN reaction that required hospitalization No ?Has patient had a PCN reaction occurring within the last 10 years: No ?If all of the above answers are "NO", then may proceed with Cephalosporin use. ?Passed out ?Has patient had a PCN reaction causing immediate rash, facial/tongue/throat swelling, SOB or lightheadedness with hypotension: Yes ?Has patient had a PCN reaction causing severe rash  involving mucus membranes or skin necrosis: No ?Has patient had a PCN reaction that required hospitalization No ?Has patient had a PCN reaction occurring within the last 10 years: No ?If all of the above answers are "NO", then may proceed with Cephalosporin use. ?Passed out ?fainted  ? ? ?Social History  ? ?Socioeconomic History  ? Marital status: Married  ?  Spouse name: Not on file  ? Number of children: Not on file  ? Years of education: Not on file  ? Highest education level: Not on file  ?Occupational History  ? Not on file  ?Tobacco Use  ? Smoking status: Never  ? Smokeless tobacco: Never  ?Substance and Sexual Activity  ? Alcohol use: No  ? Drug use: No  ? Sexual activity: Not on file  ?Other Topics Concern  ? Not on file  ?Social History Narrative  ? Not on file  ? ?Social Determinants of Health  ? ?Financial Resource Strain: Low Risk   ? Difficulty of Paying Living Expenses: Not hard at all  ?Food Insecurity: No Food Insecurity  ? Worried About [employer] of  Food in the Last Year: Never true  ? Ran Out of Food in the Last Year: Never true  ?Transportation Needs: No Transportation Needs  ? Lack of Transportation (Medical): No  ? Lack of Transportation (Non-Medical): No  ?Physical Activity: Insufficiently Active  ? Days of Exercise per Week: 3 days  ? Minutes of Exercise per Session: 30 min  ?Stress: No Stress Concern Present  ? Feeling of Stress : Not at all  ?Social Connections: Socially Integrated  ? Frequency of Communication with Friends and Family: Twice a week  ? Frequency of Social Gatherings with Friends and Family: Twice a week  ? Attends Religious Services: More than 4 times per year  ? Active Member of Clubs or Organizations: Yes  ? Attends Archivist Meetings: 1 to 4 times per year  ? Marital Status: Married  ? ?Vitals:  ? 05/04/21 0953  ?BP: 124/80  ?Pulse: 81  ?Resp: 16  ?SpO2: 97%  ? ?Body mass index is 27.14 kg/m?. ? ?Physical Exam ?Vitals and nursing note reviewed.   ?Constitutional:   ?   General: She is not in acute distress. ?   Appearance: She is well-developed.  ?HENT:  ?   Head: Normocephalic and atraumatic.  ?   Mouth/Throat:  ?   Mouth: Mucous membranes are moist.  ?   Pharynx: Oropharyn

## 2021-05-04 ENCOUNTER — Ambulatory Visit (INDEPENDENT_AMBULATORY_CARE_PROVIDER_SITE_OTHER): Payer: Medicare HMO | Admitting: Family Medicine

## 2021-05-04 ENCOUNTER — Encounter: Payer: Self-pay | Admitting: Family Medicine

## 2021-05-04 VITALS — BP 124/80 | HR 81 | Resp 16 | Ht 66.0 in | Wt 168.1 lb

## 2021-05-04 DIAGNOSIS — R208 Other disturbances of skin sensation: Secondary | ICD-10-CM

## 2021-05-04 DIAGNOSIS — M545 Low back pain, unspecified: Secondary | ICD-10-CM

## 2021-05-04 DIAGNOSIS — R202 Paresthesia of skin: Secondary | ICD-10-CM

## 2021-05-04 DIAGNOSIS — G8929 Other chronic pain: Secondary | ICD-10-CM

## 2021-05-04 DIAGNOSIS — I700 Atherosclerosis of aorta: Secondary | ICD-10-CM

## 2021-05-04 DIAGNOSIS — M816 Localized osteoporosis [Lequesne]: Secondary | ICD-10-CM | POA: Diagnosis not present

## 2021-05-04 MED ORDER — GABAPENTIN 100 MG PO CAPS: 100.0000 mg | ORAL_CAPSULE | Freq: Every day | ORAL | 0 refills | Status: DC

## 2021-05-04 NOTE — Assessment & Plan Note (Addendum)
We discussed some side effects of Prolia. ?For now she agrees with continue taking it. ?Continue Ca++ and vit D supplementation. ?Fall prevention. ?

## 2021-05-04 NOTE — Assessment & Plan Note (Addendum)
Problem has improved with Gabapentin 100 mg at bedtime. ?Because she is concerned about balance issues with gabapentin, we decided to wean it off. ?We could consider Duloxetine if problem reoccurs.Continue adequate skin care. ? ?

## 2021-05-04 NOTE — Assessment & Plan Note (Addendum)
Pain has been stable for years but now having LE/feet burning sensation and thighs achy like pain. All these symptoms improved with Methylprednisolone.Lumbar MRI will be arranged. ?Continue Tylenol 500 mg x 2 at bedtime ? ?

## 2021-05-04 NOTE — Assessment & Plan Note (Signed)
She is having thigh achy since Atorvastatin started, so discontinue for now. ?We discussed CV benefits of statin. ?

## 2021-05-04 NOTE — Patient Instructions (Addendum)
A few things to remember from today's visit: ? ?Paresthesia of both lower extremities - Plan: MR Lumbar Spine Wo Contrast ? ?Chronic bilateral low back pain, unspecified whether sciatica present - Plan: MR Lumbar Spine Wo Contrast ? ?Burning sensation of feet - Plan: MR Lumbar Spine Wo Contrast ? ?If you need refills please call your pharmacy. ?Do not use My Chart to request refills or for acute issues that need immediate attention. ?  ?We are going to stop Atorvastatin and monitor for changes in achy legs. ?Gabapentin to wean off, take it every other night for 7-10 days then every 3rd night for 7-10 days and stop. ?We are scheduling a lumbar MRI. ? ?Please be sure medication list is accurate. ?If a new problem present, please set up appointment sooner than planned today. ? ? ? ? ? ? ? ?

## 2021-05-05 ENCOUNTER — Other Ambulatory Visit: Payer: Self-pay | Admitting: Family Medicine

## 2021-05-05 DIAGNOSIS — Z1231 Encounter for screening mammogram for malignant neoplasm of breast: Secondary | ICD-10-CM

## 2021-05-06 ENCOUNTER — Other Ambulatory Visit: Payer: Self-pay | Admitting: Family Medicine

## 2021-06-19 ENCOUNTER — Encounter: Payer: Self-pay | Admitting: Family Medicine

## 2021-06-19 ENCOUNTER — Ambulatory Visit (INDEPENDENT_AMBULATORY_CARE_PROVIDER_SITE_OTHER): Payer: Medicare HMO

## 2021-06-19 VITALS — BP 118/62 | HR 84 | Temp 98.0°F | Ht 66.0 in | Wt 163.4 lb

## 2021-06-19 DIAGNOSIS — Z0000 Encounter for general adult medical examination without abnormal findings: Secondary | ICD-10-CM

## 2021-06-19 NOTE — Progress Notes (Signed)
Subjective:   Tanya Swanson is a 81 y.o. female who presents for Medicare Annual (Subsequent) preventive examination.  Review of Systems     Cardiac Risk Factors include: advanced age (>16mn, >>42women)     Objective:    Today's Vitals   06/19/21 0917  BP: 118/62  Pulse: 84  Temp: 98 F (36.7 C)  TempSrc: Oral  SpO2: 97%  Weight: 163 lb 6.4 oz (74.1 kg)  Height: '5\' 6"'$  (1.676 m)   Body mass index is 26.37 kg/m.     06/19/2021    9:35 AM 06/18/2020    9:14 AM 04/03/2015    9:16 AM 02/24/2015    6:30 AM 03/14/2014   10:17 AM  Advanced Directives  Does Patient Have a Medical Advance Directive? No Yes No No No  Type of [place]     Copy of [place] Chart?  No - copy requested     Would patient like information on creating a medical advance directive? Yes (ED - Information included in AVS)  No - patient declined information Yes - Educational materials given Yes - Educational materials given    Current Medications (verified) Outpatient Encounter Medications as of 06/19/2021  Medication Sig   Acetaminophen (TYLENOL PO) Take 500 mg by mouth as needed.   atorvastatin (LIPITOR) 10 MG tablet TAKE 1 TABLET BY MOUTH EVERY DAY   Biotin 5 MG TABS Take by mouth.   Calcium-Vitamin D-Vitamin K 5469-629-52MG-UNT-MCG CHEW Chew by mouth.   cetirizine (ZYRTEC) 5 MG tablet Take by mouth.   Cholecalciferol (VITAMIN D3) 50 MCG (2000 UT) capsule Take by mouth.   cycloSPORINE (RESTASIS) 0.05 % ophthalmic emulsion Place 1 drop into both eyes 2 (two) times daily.   Famotidine (PEPCID PO) Take by mouth daily.   gabapentin (NEURONTIN) 100 MG capsule Take 1 capsule (100 mg total) by mouth at bedtime for 20 days.   polyethylene glycol (MIRALAX / GLYCOLAX) packet Take 17 g by mouth daily as needed. For constipation.   No facility-administered encounter medications on file as of 06/19/2021.    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   Blood in stool    Chicken pox    Chronic kidney disease    UTI and kidneystones   Constipation    unknown urology procedure 2006   Hiatal hernia    Kidney stones    Pneumonia 1968   Urinary tract infection    Past Surgical History:  Procedure Laterality Date   CATARACT EXTRACTION W/PHACO Right 02/24/2015   Procedure: CATARACT EXTRACTION PHACO AND INTRAOCULAR LENS PLACEMENT RIGHT EYE CDE=8.01;  Surgeon: KTonny Branch MD;  Location: [facility];  Service: Ophthalmology;  Laterality: Right;   CATARACT EXTRACTION W/PHACO Left 04/03/2015   Procedure: CATARACT EXTRACTION PHACO AND INTRAOCULAR LENS PLACEMENT (IOC);  Surgeon: KTonny Branch MD;  Location: [facility];  Service: Ophthalmology;  Laterality: Left;  CDE:11.74   DILATION AND CURETTAGE OF UTERUS     LITHOTRIPSY     TONSILLECTOMY  1951   Family History  Problem Relation Age of Onset   Breast cancer Maternal Aunt    Arthritis Mother    Hypertension Mother    Arthritis Father    Hypertension Father    Social History   Socioeconomic History   Marital status: Married  Spouse name: Not on file   Number of children: Not on file   Years of education: Not on file   Highest education level: Not on file  Occupational History   Not on file  Tobacco Use   Smoking status: Never   Smokeless tobacco: Never  Substance and Sexual Activity   Alcohol use: No   Drug use: No   Sexual activity: Not on file  Other Topics Concern   Not on file  Social History Narrative   Not on file   Social Determinants of Health   Financial Resource Strain: Low Risk  (06/19/2021)   Overall Financial Resource Strain (CARDIA)    Difficulty of Paying Living Expenses: Not hard at all  Food Insecurity: No Food Insecurity (06/19/2021)   Hunger Vital Sign    Worried About Running Out of Food in the Last Year: Never true    Ran Out of  Food in the Last Year: Never true  Transportation Needs: No Transportation Needs (06/19/2021)   PRAPARE - Hydrologist (Medical): No    Lack of Transportation (Non-Medical): No  Physical Activity: Insufficiently Active (06/19/2021)   Exercise Vital Sign    Days of Exercise per Week: 7 days    Minutes of Exercise per Session: 10 min  Stress: No Stress Concern Present (06/19/2021)   [place]    Feeling of Stress : Not at all  Social Connections: [place] (06/19/2021)   Social Connection and Isolation Panel [NHANES]    Frequency of Communication with Friends and Family: More than three times a week    Frequency of Social Gatherings with Friends and Family: More than three times a week    Attends Religious Services: More than 4 times per year    Active Member of [employer] or Organizations: Yes    Attends Archivist Meetings: Not on file    Marital Status: Married     Clinical Intake:  Pre-visit preparation completed: NoDiabetic? No  Activities of Daily Living    06/19/2021    9:33 AM  In your present state of health, do you have any difficulty performing the following activities:  Hearing? 0  Vision? 0  Difficulty concentrating or making decisions? 0  Walking or climbing stairs? 0  Dressing or bathing? 0  Doing errands, shopping? 0  Preparing Food and eating ? N  Using the Toilet? N  In the past six months, have you accidently leaked urine? N  Do you have problems with loss of bowel control? N  Managing your Medications? N  Managing your Finances? N  Housekeeping or managing your Housekeeping? N    Patient Care Team: [place], Betty G, MD as PCP - General (Family Medicine)  Indicate any recent Medical Services you may have received from other than [facility] providers in the past year (date may be approximate).     Assessment:   This is a routine wellness examination for  Tanya Swanson.  Hearing/Vision screen Hearing Screening - Comments:: Wears hearing aids Vision Screening - Comments:: Wears reading glasses. Followed by Dr Jorja Loa  Dietary issues and exercise activities discussed: Exercise limited by: None identified   Goals Addressed               This Visit's Progress     No current goals (pt-stated)         Depression Screen    06/19/2021    9:30 AM 02/02/2021  10:06 AM 12/03/2020   10:17 AM 06/18/2020    9:17 AM 09/18/2019   11:28 AM 04/12/2017    8:05 AM  PHQ 2/9 Scores  PHQ - 2 Score 0 1 0 0 0 0  PHQ- 9 Score   1       Fall Risk    06/19/2021    9:34 AM 05/04/2021    9:52 PM 06/18/2020    9:16 AM 09/18/2019   11:28 AM 09/13/2018    1:42 PM  Fall Risk   Falls in the past year? 0 0 0 0 0  Number falls in past yr: 0 0 0  0  Injury with Fall? 0 0 0  0  Risk for fall due to : No Fall Risks  No Fall Risks    Follow up  Education provided   Education provided    FALL RISK PREVENTION PERTAINING TO THE HOME:  Any stairs in or around the home? Yes  If so, are there any without handrails? No  Home free of loose throw rugs in walkways, pet beds, electrical cords, etc? Yes  Adequate lighting in your home to reduce risk of falls? Yes   ASSISTIVE DEVICES UTILIZED TO PREVENT FALLS:  Life alert? No  Use of a cane, walker or w/c? No  Grab bars in the bathroom? No  Shower chair or bench in shower? No  Elevated toilet seat or a handicapped toilet? No   TIMED UP AND GO:  Was the test performed? Yes .  Length of time to ambulate 10 feet: 5 sec.   Gait steady and fast without use of assistive device  Cognitive Function:        06/19/2021    9:36 AM  6CIT Screen  What Year? 0 points  What month? 0 points  What time? 0 points  Count back from 20 0 points  Months in reverse 0 points  Repeat phrase 0 points  Total Score 0 points    Immunizations Immunization History  Administered Date(s) Administered   Influenza Split 12/20/2003,  12/08/2004, 11/11/2005, 11/11/2006, 10/12/2007, 10/24/2008, 10/23/2009, 11/04/2010, 10/18/2011, 10/24/2019   Influenza, High Dose Seasonal PF 10/08/2014, 10/12/2017, 10/10/2018   Influenza,inj,Quad PF,6+ Mos 10/13/2015   Influenza-Unspecified 10/28/2020   Moderna Covid-19 Vaccine Bivalent Booster 41yr & up 11/04/2020   Moderna Sars-Covid-2 Vaccination 01/22/2019, 02/19/2019, 11/08/2019, 06/25/2020   Pneumococcal Conjugate-13 07/22/2014, 04/09/2016   Pneumococcal Polysaccharide-23 01/12/2011, 10/18/2011   Td 05/08/1991   Tdap 01/14/2011   Zoster, Live 01/12/2007      Flu Vaccine status: Up to date  Pneumococcal vaccine status: Up to date  Covid-19 vaccine status: Completed vaccines  Screening Tests Health Maintenance  Topic Date Due   COVID-19 Vaccine (6 - Moderna series) 07/05/2021 (Originally 03/07/2021)   TETANUS/TDAP  01/14/2022 (Originally 01/13/2021)   Zoster Vaccines- Shingrix (1 of 2) 01/16/2026 (Originally 02/13/1989)   INFLUENZA VACCINE  08/11/2021   Pneumonia Vaccine 81 Years old  Completed   DEXA SCAN  Completed   HPV VACCINES  Aged Out    Health Maintenance  There are no preventive care reminders to display for this patient.   Colorectal cancer screening: No longer required.   Mammogram status: No longer required due to Age.  Bone Density status: Completed 09/26/19. Results reflect: Bone density results: OSTEOPOROSIS. Repeat every   years.  Lung Cancer Screening: (Low Dose CT Chest recommended if Age 82-80years, 30 pack-year currently smoking OR have quit w/in 15years.) does not qualify.     Additional  Screening:  Hepatitis C Screening: does not qualify; Completed   Vision Screening: Recommended annual ophthalmology exams for early detection of glaucoma and other disorders of the eye. Is the patient up to date with their annual eye exam?  Yes  Who is the provider or what is the name of the office in which the patient attends annual eye exams? Dr  Jorja Loa If pt is not established with a provider, would they like to be referred to a provider to establish care? No .   Dental Screening: Recommended annual dental exams for proper oral hygiene  Community Resource Referral / Chronic Care Management:  CRR required this visit?  No   CCM required this visit?  No      Plan:     I have personally reviewed and noted the following in the patient's chart:   Medical and social history Use of alcohol, tobacco or illicit drugs  Current medications and supplements including opioid prescriptions.  Functional ability and status Nutritional status Physical activity Advanced directives List of other physicians Hospitalizations, surgeries, and ER visits in previous 12 months Vitals Screenings to include cognitive, depression, and falls Referrals and appointments  In addition, I have reviewed and discussed with patient certain preventive protocols, quality metrics, and best practice recommendations. A written personalized care plan for preventive services as well as general preventive health recommendations were provided to patient.     Criselda Peaches, LPN   02/13/7626   Nurse Notes: None

## 2021-06-19 NOTE — Patient Instructions (Addendum)
Tanya Swanson , Thank you for taking time to come for your Medicare Wellness Visit. I appreciate your ongoing commitment to your health goals. Please review the following plan we discussed and let me know if I can assist you in the future.   These are the goals we discussed:  Goals       Exercise 3x per week (30 min per time)      No current goals (pt-stated)        This is a list of the screening recommended for you and due dates:  Health Maintenance  Topic Date Due   COVID-19 Vaccine (6 - Moderna series) 07/05/2021*   Tetanus Vaccine  01/14/2022*   Zoster (Shingles) Vaccine (1 of 2) 01/16/2026*   Flu Shot  08/11/2021   Pneumonia Vaccine  Completed   DEXA scan (bone density measurement)  Completed   HPV Vaccine  Aged Out  *Topic was postponed. The date shown is not the original due date.   Advanced directives: No   Conditions/risks identified: None  Next appointment: Follow up in one year for your annual wellness visit     Preventive Care 65 Years and Older, Female Preventive care refers to lifestyle choices and visits with your health care provider that can promote health and wellness. What does preventive care include? A yearly physical exam. This is also called an annual well check. Dental exams once or twice a year. Routine eye exams. Ask your health care provider how often you should have your eyes checked. Personal lifestyle choices, including: Daily care of your teeth and gums. Regular physical activity. Eating a healthy diet. Avoiding tobacco and drug use. Limiting alcohol use. Practicing safe sex. Taking low-dose aspirin every day. Taking vitamin and mineral supplements as recommended by your health care provider. What happens during an annual well check? The services and screenings done by your health care provider during your annual well check will depend on your age, overall health, lifestyle risk factors, and family history of disease. Counseling  Your  health care provider may ask you questions about your: Alcohol use. Tobacco use. Drug use. Emotional well-being. Home and relationship well-being. Sexual activity. Eating habits. History of falls. Memory and ability to understand (cognition). Work and work Statistician. Reproductive health. Screening  You may have the following tests or measurements: Height, weight, and BMI. Blood pressure. Lipid and cholesterol levels. These may be checked every 5 years, or more frequently if you are over 13 years old. Skin check. Lung cancer screening. You may have this screening every year starting at age 35 if you have a 30-pack-year history of smoking and currently smoke or have quit within the past 15 years. Fecal occult blood test (FOBT) of the stool. You may have this test every year starting at age 31. Flexible sigmoidoscopy or colonoscopy. You may have a sigmoidoscopy every 5 years or a colonoscopy every 10 years starting at age 38. Hepatitis C blood test. Hepatitis B blood test. Sexually transmitted disease (STD) testing. Diabetes screening. This is done by checking your blood sugar (glucose) after you have not eaten for a while (fasting). You may have this done every 1-3 years. Bone density scan. This is done to screen for osteoporosis. You may have this done starting at age 27. Mammogram. This may be done every 1-2 years. Talk to your health care provider about how often you should have regular mammograms. Talk with your health care provider about your test results, treatment options, and if necessary, the need  for more tests. Vaccines  Your health care provider may recommend certain vaccines, such as: Influenza vaccine. This is recommended every year. Tetanus, diphtheria, and acellular pertussis (Tdap, Td) vaccine. You may need a Td booster every 10 years. Zoster vaccine. You may need this after age 3. Pneumococcal 13-valent conjugate (PCV13) vaccine. One dose is recommended after age  64. Pneumococcal polysaccharide (PPSV23) vaccine. One dose is recommended after age 68. Talk to your health care provider about which screenings and vaccines you need and how often you need them. This information is not intended to replace advice given to you by your health care provider. Make sure you discuss any questions you have with your health care provider. Document Released: 01/24/2015 Document Revised: 09/17/2015 Document Reviewed: 10/29/2014 Elsevier Interactive Patient Education  2017 [place] Prevention in the Home Falls can cause injuries. They can happen to people of all ages. There are many things you can do to make your home safe and to help prevent falls. What can I do on the outside of my home? Regularly fix the edges of walkways and driveways and fix any cracks. Remove anything that might make you trip as you walk through a door, such as a raised step or threshold. Trim any bushes or trees on the path to your home. Use bright outdoor lighting. Clear any walking paths of anything that might make someone trip, such as rocks or tools. Regularly check to see if handrails are loose or broken. Make sure that both sides of any steps have handrails. Any raised decks and porches should have guardrails on the edges. Have any leaves, snow, or ice cleared regularly. Use sand or salt on walking paths during winter. Clean up any spills in your garage right away. This includes oil or grease spills. What can I do in the bathroom? Use night lights. Install grab bars by the toilet and in the tub and shower. Do not use towel bars as grab bars. Use non-skid mats or decals in the tub or shower. If you need to sit down in the shower, use a plastic, non-slip stool. Keep the floor dry. Clean up any water that spills on the floor as soon as it happens. Remove soap buildup in the tub or shower regularly. Attach bath mats securely with double-sided non-slip rug tape. Do not have throw  rugs and other things on the floor that can make you trip. What can I do in the bedroom? Use night lights. Make sure that you have a light by your bed that is easy to reach. Do not use any sheets or blankets that are too big for your bed. They should not hang down onto the floor. Have a firm chair that has side arms. You can use this for support while you get dressed. Do not have throw rugs and other things on the floor that can make you trip. What can I do in the kitchen? Clean up any spills right away. Avoid walking on wet floors. Keep items that you use a lot in easy-to-reach places. If you need to reach something above you, use a strong step stool that has a grab bar. Keep electrical cords out of the way. Do not use floor polish or wax that makes floors slippery. If you must use wax, use non-skid floor wax. Do not have throw rugs and other things on the floor that can make you trip. What can I do with my stairs? Do not leave any items on the stairs.  Make sure that there are handrails on both sides of the stairs and use them. Fix handrails that are broken or loose. Make sure that handrails are as long as the stairways. Check any carpeting to make sure that it is firmly attached to the stairs. Fix any carpet that is loose or worn. Avoid having throw rugs at the top or bottom of the stairs. If you do have throw rugs, attach them to the floor with carpet tape. Make sure that you have a light switch at the top of the stairs and the bottom of the stairs. If you do not have them, ask someone to add them for you. What else can I do to help prevent falls? Wear shoes that: Do not have high heels. Have rubber bottoms. Are comfortable and fit you well. Are closed at the toe. Do not wear sandals. If you use a stepladder: Make sure that it is fully opened. Do not climb a closed stepladder. Make sure that both sides of the stepladder are locked into place. Ask someone to hold it for you, if  possible. Clearly mark and make sure that you can see: Any grab bars or handrails. First and last steps. Where the edge of each step is. Use tools that help you move around (mobility aids) if they are needed. These include: Canes. Walkers. Scooters. Crutches. Turn on the lights when you go into a dark area. Replace any light bulbs as soon as they burn out. Set up your furniture so you have a clear path. Avoid moving your furniture around. If any of your floors are uneven, fix them. If there are any pets around you, be aware of where they are. Review your medicines with your doctor. Some medicines can make you feel dizzy. This can increase your chance of falling. Ask your doctor what other things that you can do to help prevent falls. This information is not intended to replace advice given to you by your health care provider. Make sure you discuss any questions you have with your health care provider. Document Released: 10/24/2008 Document Revised: 06/05/2015 Document Reviewed: 02/01/2014 Elsevier Interactive Patient Education  2017 [employer].

## 2021-06-23 ENCOUNTER — Ambulatory Visit
Admission: RE | Admit: 2021-06-23 | Discharge: 2021-06-23 | Disposition: A | Discharge status: Home / Self Care | Payer: Medicare HMO | Source: Ambulatory Visit | Attending: Family Medicine | Admitting: Family Medicine

## 2021-06-23 DIAGNOSIS — Z1231 Encounter for screening mammogram for malignant neoplasm of breast: Secondary | ICD-10-CM

## 2021-07-03 NOTE — Progress Notes (Signed)
HPI: Tanya Swanson Plan [name] is a 81 y.o. female, who is here today to follow on recent visit.  She was last seen on 05/04/2021, when she reported new onset of feet burning sensation.  Gabapentin started on 12/03/20 because lower extremity pain mainly at night when she was in bed and burning-like sensation, 7/10.  Problem has been going on for a few years but was getting worse.  Last visit she voiced concerns about possible side effects of gabapentin, she was having some unstable gait , so it was weaned off. Still having burning sensation on anterior aspect of ankle and dorsum of feet during the day but not severe.  Negative for worsening LE edema,calves pain,or erythema. No claudication like symptoms.  Her health insurance approved lumbar MRI but it was scheduled in a facility far from home, she cannot drive there, she is asking if he can be changed to a different [facility] or a place closer home.  Lower back pain, no radiated, it has been stable. She still taking Tylenol 500 mg x 2 at bedtime. It does not interfere with ADLs during the day.  -B12 deficiency: She is on B12 supplementation.  Lab Results  Component Value Date   [id number] (H) 02/02/2021   Atorvastatin was discontinued because LE aches and tightness sensation, she is feeling "much much better." She is following low-fat diet. She would like her cholesterol checked today. Aortic atherosclerosis seen on imaging.  Lab Results  Component Value Date   CHOL 163 02/02/2021   HDL 55.70 02/02/2021   LDLCALC 76 02/02/2021   TRIG 157.0 (H) 02/02/2021   CHOLHDL 3 02/02/2021   Today she is concerned about left upper eyelid erythema and pruritus. She has seen optometrist for this problems. She has tried treatment x 2. Recommended f/u with ophthalmologist, pending appt. Problem has been intermittent for about 4 months, crusty area on edge of left upper eye lid. No new make up or skin product. No sick contact.    No ocular pain, conjunctival erythema, drainage, or visual changes. She has used Restasis eyedrops for about 2 years.  -Since her last visit she has also followed with audiologist because worsening left hearing loss. According to pt, it was recommended to have an ear lavage of left ear. Negative for earache or recent URI.  Review of Systems  Constitutional:  Negative for activity change, appetite change and fever.  HENT:  Negative for ear discharge, mouth sores and sore throat.   Respiratory:  Negative for cough, shortness of breath and wheezing.   Cardiovascular:  Negative for chest pain and palpitations.  Gastrointestinal:  Negative for abdominal pain, nausea and vomiting.       Negative for changes in bowel habits.  Genitourinary:  Negative for decreased urine volume, dysuria and hematuria.  Musculoskeletal:  Positive for back pain.  Neurological:  Negative for syncope, weakness and headaches.  Psychiatric/Behavioral:  Negative for confusion. The patient is nervous/anxious.   Rest see pertinent positives and negatives per HPI.  Current Outpatient Medications on File Prior to Visit  Medication Sig Dispense Refill   Acetaminophen (TYLENOL PO) Take 500 mg by mouth as needed.     atorvastatin (LIPITOR) 10 MG tablet TAKE 1 TABLET BY MOUTH EVERY DAY 90 tablet 3   Biotin 5 MG TABS Take by mouth.     Calcium-Vitamin D-Vitamin K [id number] MG-UNT-MCG CHEW Chew by mouth.     Cholecalciferol (VITAMIN D3) 50 MCG (2000 UT) capsule Take by mouth.  cycloSPORINE (RESTASIS) 0.05 % ophthalmic emulsion Place 1 drop into both eyes 2 (two) times daily.     Famotidine (PEPCID PO) Take by mouth daily.     polyethylene glycol (MIRALAX / GLYCOLAX) packet Take 17 g by mouth daily as needed. For constipation.     No current facility-administered medications on file prior to visit.   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   Blood in stool     Chicken pox    Chronic kidney disease    UTI and kidneystones   Constipation    unknown urology procedure 2006   Hiatal hernia    Kidney stones    Pneumonia 1968   Urinary tract infection    Allergies  Allergen Reactions   Penicillins Other (See Comments)    Has patient had a PCN reaction causing immediate rash, facial/tongue/throat swelling, SOB or lightheadedness with hypotension: Yes Has patient had a PCN reaction causing severe rash involving mucus membranes or skin necrosis: No Has patient had a PCN reaction that required hospitalization No Has patient had a PCN reaction occurring within the last 10 years: No If all of the above answers are "NO", then may proceed with Cephalosporin use. Passed out Has patient had a PCN reaction causing immediate rash, facial/tongue/throat swelling, SOB or lightheadedness with hypotension: Yes Has patient had a PCN reaction causing severe rash involving mucus membranes or skin necrosis: No Has patient had a PCN reaction that required hospitalization No Has patient had a PCN reaction occurring within the last 10 years: No If all of the above answers are "NO", then may proceed with Cephalosporin use. Passed out fainted Has patient had a PCN reaction causing immediate rash, facial/tongue/throat swelling, SOB or lightheadedness with hypotension: Yes Has patient had a PCN reaction causing severe rash involving mucus membranes or skin necrosis: No Has patient had a PCN reaction that required hospitalization No Has patient had a PCN reaction occurring within the last 10 years: No If all of the above answers are "NO", then may proceed with Cephalosporin use. Passed out fainted Has patient had a PCN reaction causing immediate rash, facial/tongue/throat swelling, SOB or lightheadedness with hypotension: Yes Has patient had a PCN reaction causing severe rash involving mucus membranes or skin necrosis: No Has patient had a PCN reaction that required  hospitalization No Has patient had a PCN reaction occurring within the last 10 years: No If all of the above answers are "NO", then may proceed with Cephalosporin use. Passed out Has patient had a PCN reaction causing immediate rash, facial/tongue/throat swelling, SOB or lightheadedness with hypotension: Yes Has patient had a PCN reaction causing severe rash involving mucus membranes or skin necrosis: No Has patient had a PCN reaction that required hospitalization No Has patient had a PCN reaction occurring within the last 10 years: No If all of the above answers are "NO", then may proceed with Cephalosporin use. Passed out fainted    Social History   Socioeconomic History   Marital status: Married    Spouse name: Not on file   Number of children: Not on file   Years of education: Not on file   Highest education level: Not on file  Occupational History   Not on file  Tobacco Use   Smoking status: Never   Smokeless tobacco: Never  Substance and Sexual Activity   Alcohol use: No   Drug use: No  Sexual activity: Not on file  Other Topics Concern   Not on file  Social History Narrative   Not on file   Social Determinants of Health   Financial Resource Strain: Low Risk  (06/19/2021)   Overall Financial Resource Strain (CARDIA)    Difficulty of Paying Living Expenses: Not hard at all  Food Insecurity: No Food Insecurity (06/19/2021)   Hunger Vital Sign    Worried About Running Out of Food in the Last Year: Never true    Ran Out of Food in the Last Year: Never true  Transportation Needs: No Transportation Needs (06/19/2021)   PRAPARE - Hydrologist (Medical): No    Lack of Transportation (Non-Medical): No  Physical Activity: Insufficiently Active (06/19/2021)   Exercise Vital Sign    Days of Exercise per Week: 7 days    Minutes of Exercise per Session: 10 min  Stress: No Stress Concern Present (06/19/2021)   [place]    Feeling of Stress : Not at all  Social Connections: [place] (06/19/2021)   Social Connection and Isolation Panel [NHANES]    Frequency of Communication with Friends and Family: More than three times a week    Frequency of Social Gatherings with Friends and Family: More than three times a week    Attends Religious Services: More than 4 times per year    Active Member of [employer] or Organizations: Yes    Attends Archivist Meetings: Not on file    Marital Status: Married   Vitals:   07/06/21 0852  BP: 120/70  Pulse: 88  Resp: 16  Temp: 98.6 F (37 C)  SpO2: 93%   Body mass index is 26.51 kg/m.  Physical Exam Vitals and nursing note reviewed.  Constitutional:      General: She is not in acute distress.    Appearance: She is well-developed.  HENT:     Head: Normocephalic and atraumatic.     Right Ear: External ear normal.     Left Ear: External ear normal. Decreased hearing (With hearing aid) noted.     Ears:     Comments: Cerumen excess in ear canal , left side is impacted. Hearing aids bilateral.     Mouth/Throat:     Mouth: Mucous membranes are moist.     Pharynx: Oropharynx is clear.  Eyes:     Conjunctiva/sclera: Conjunctivae normal.     Comments: Edge of left upper eye lid with scaly/crusty and mild erythema, no induration, local heat,or tenderness.  Cardiovascular:     Rate and Rhythm: Normal rate and regular rhythm.     Pulses:          Dorsalis pedis pulses are 2+ on the right side and 2+ on the left side.     Heart sounds: No murmur heard.    Comments: Varicose veins LE, bilateral. Pulmonary:     Effort: Pulmonary effort is normal. No respiratory distress.     Breath sounds: Normal breath sounds.  Abdominal:     Palpations: Abdomen is soft. There is no hepatomegaly or mass.     Tenderness: There is no abdominal tenderness.  Musculoskeletal:     Thoracic back: No tenderness.     Lumbar back: No  tenderness or bony tenderness.  Lymphadenopathy:     Cervical: No cervical adenopathy.  Skin:    General: Skin is warm.     Findings: No erythema or rash.  Neurological:     General: No focal deficit present.     Mental Status: She is alert and oriented to person, place, and time.     Cranial Nerves: No cranial nerve deficit.     Comments: Otherwise stable gait, not assisted.  Psychiatric:     Comments: Well groomed, good eye contact.   ASSESSMENT AND PLAN:  Ms.Janesha was seen today for follow-up.  Diagnoses and all orders for this visit: Orders Placed This Encounter  Procedures   Lipid panel   TSH   Vitamin B12   CBC   Lab Results  Component Value Date   WBC 5.8 07/06/2021   HGB 13.1 07/06/2021   HCT 39.4 07/06/2021   MCV 90.7 07/06/2021   PLT 258.0 07/06/2021   Lab Results  Component Value Date   VITAMINB12 872 07/06/2021   Lab Results  Component Value Date   CHOL 177 07/06/2021   HDL 62.10 07/06/2021   LDLCALC 86 07/06/2021   TRIG 146.0 07/06/2021   CHOLHDL 3 07/06/2021   Lab Results  Component Value Date   TSH 1.22 07/06/2021   Hearing loss due to cerumen impaction, left After discussing procedure, including risk, she gave verbal consent to proceed with ear lavage. She tolerated procedure well. Hearing greatly improved. Able to see TM : No erythema.  Blepharitis of left upper eyelid, unspecified type Educated about diagnosis, prognosis, treatment options. Recommend topical antibiotic, erythromycin for 7 to 10 days. Very small amount of desonide on affected area of upper eyelid twice daily for up to 14 days then she can continue as needed. Baby Johnson shampoo daily with gentle scrubbing on affected area may also help. Instructed about warning signs. According to patient, ophthalmology appointment is being arranged by her optometrist.  -     erythromycin ophthalmic ointment; Place 1 Application into the left eye at bedtime for 10 days. -      desonide (DESOWEN) 0.05 % cream; Apply topically daily as needed.  B12 deficiency Continue current dose of supplementation. Further recommendation will be given according to B12 result.  Atherosclerosis of aorta (HCC) We will recheck fasting lipid panel today and will give recommendations accordingly. We can consider resuming statin and take it 2-3 times per week instead daily OR Bempedoic acid. Continue low-fat diet.  Paresthesia of both lower extremities Still having symptoms, she wonders if she could resume gabapentin lower dose but as needed. It is okay to resume gabapentin 100 mg at bedtime as needed.  Chronic lower back pain Stable. Continue Tylenol 500 mg 2 tabs at bedtime. Lumbar MRI has been approved by her insurance but she would like to change location, recommend calling yeast per radiology or her health insurance and see if she can have it done in another location that is closer home.  I spent a total of 45 minutes in both face to face and non face to face activities for this visit on the date of this encounter, excluding time for ear lavage. During this time history was obtained and documented, examination was performed, prior labs reviewed, and assessment/plan discussed.  Return in about 6 months (around 01/05/2022) for cpe.  Amiir Heckard G. [place], MD  [facility]. [place] office.

## 2021-07-06 ENCOUNTER — Ambulatory Visit (INDEPENDENT_AMBULATORY_CARE_PROVIDER_SITE_OTHER): Payer: Medicare HMO | Admitting: Family Medicine

## 2021-07-06 ENCOUNTER — Encounter: Payer: Self-pay | Admitting: Family Medicine

## 2021-07-06 VITALS — BP 120/70 | HR 88 | Temp 98.6°F | Resp 16 | Ht 66.0 in | Wt 164.2 lb

## 2021-07-06 DIAGNOSIS — R202 Paresthesia of skin: Secondary | ICD-10-CM | POA: Diagnosis not present

## 2021-07-06 DIAGNOSIS — H6122 Impacted cerumen, left ear: Secondary | ICD-10-CM | POA: Diagnosis not present

## 2021-07-06 DIAGNOSIS — M545 Low back pain, unspecified: Secondary | ICD-10-CM

## 2021-07-06 DIAGNOSIS — E538 Deficiency of other specified B group vitamins: Secondary | ICD-10-CM | POA: Diagnosis not present

## 2021-07-06 DIAGNOSIS — I700 Atherosclerosis of aorta: Secondary | ICD-10-CM

## 2021-07-06 DIAGNOSIS — H01004 Unspecified blepharitis left upper eyelid: Secondary | ICD-10-CM

## 2021-07-06 DIAGNOSIS — G8929 Other chronic pain: Secondary | ICD-10-CM

## 2021-07-06 LAB — LIPID PANEL
Cholesterol: 177 mg/dL (ref 0–200)
HDL: 62.1 mg/dL (ref >39.00)
LDL Cholesterol: 86 mg/dL (ref 0–99)
NonHDL: 114.9
Total CHOL/HDL Ratio: 3
Triglycerides: 146 mg/dL (ref 0.0–149.0)
VLDL: 29.2 mg/dL (ref 0.0–40.0)

## 2021-07-06 LAB — CBC
HCT: 39.4 % (ref 36.0–46.0)
Hemoglobin: 13.1 g/dL (ref 12.0–15.0)
MCHC: 33.3 g/dL (ref 30.0–36.0)
MCV: 90.7 fl (ref 78.0–100.0)
Platelets: 258 10*3/uL (ref 150.0–400.0)
RBC: 4.34 Mil/uL (ref 3.87–5.11)
RDW: 13.1 % (ref 11.5–15.5)
WBC: 5.8 10*3/uL (ref 4.0–10.5)

## 2021-07-06 LAB — VITAMIN B12: Vitamin B-12: 872 pg/mL (ref 211–911)

## 2021-07-06 LAB — TSH: TSH: 1.22 u[IU]/mL (ref 0.35–5.50)

## 2021-07-06 MED ORDER — ERYTHROMYCIN 5 MG/GM OP OINT: 1.0000 | TOPICAL_OINTMENT | Freq: Every day | OPHTHALMIC | 0 refills | Status: AC

## 2021-07-06 MED ORDER — DESONIDE 0.05 % EX CREA
TOPICAL_CREAM | Freq: Every day | CUTANEOUS | 0 refills | Status: DC | PRN
Start: 2021-07-06 — End: 2022-11-17

## 2021-07-06 NOTE — Assessment & Plan Note (Signed)
Still having symptoms, she wonders if she could resume gabapentin lower dose but as needed. It is okay to resume gabapentin 100 mg at bedtime as needed.

## 2021-07-06 NOTE — Assessment & Plan Note (Addendum)
We will recheck fasting lipid panel today and will give recommendations accordingly. We can consider resuming statin and take it 2-3 times per week instead daily OR Bempedoic acid. Continue low-fat diet.

## 2021-07-06 NOTE — Assessment & Plan Note (Signed)
Stable. Continue Tylenol 500 mg 2 tabs at bedtime. Lumbar MRI has been approved by her insurance but she would like to change location, recommend calling yeast per radiology or her health insurance and see if she can have it done in another location that is closer home.

## 2021-07-13 IMAGING — MG MM DIGITAL SCREENING BILAT W/ TOMO AND CAD
8 series · 8 of 24 positions shown · non-contrast
Comparison: Previous exam(s).

CLINICAL DATA: Screening.

EXAM:
DIGITAL SCREENING BILATERAL MAMMOGRAM WITH TOMOSYNTHESIS AND CAD
TECHNIQUE: Bilateral screening digital craniocaudal and mediolateral oblique
mammograms were obtained. Bilateral screening digital breast
tomosynthesis was performed. The images were evaluated with
computer-aided detection.

[R MLO synth-2D]
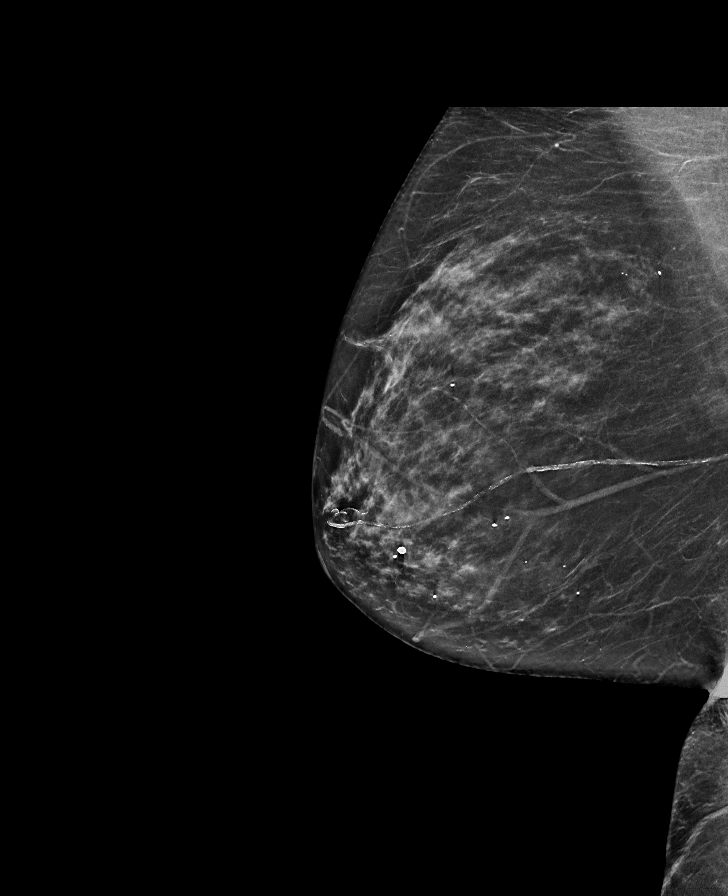

[L CC synth-2D]
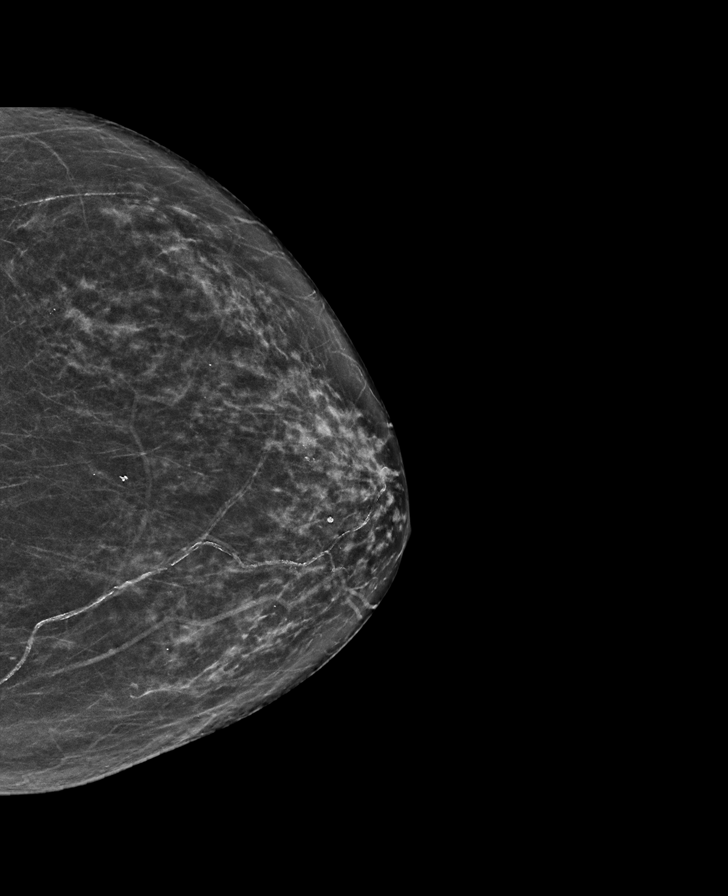

[R CC synth-2D]
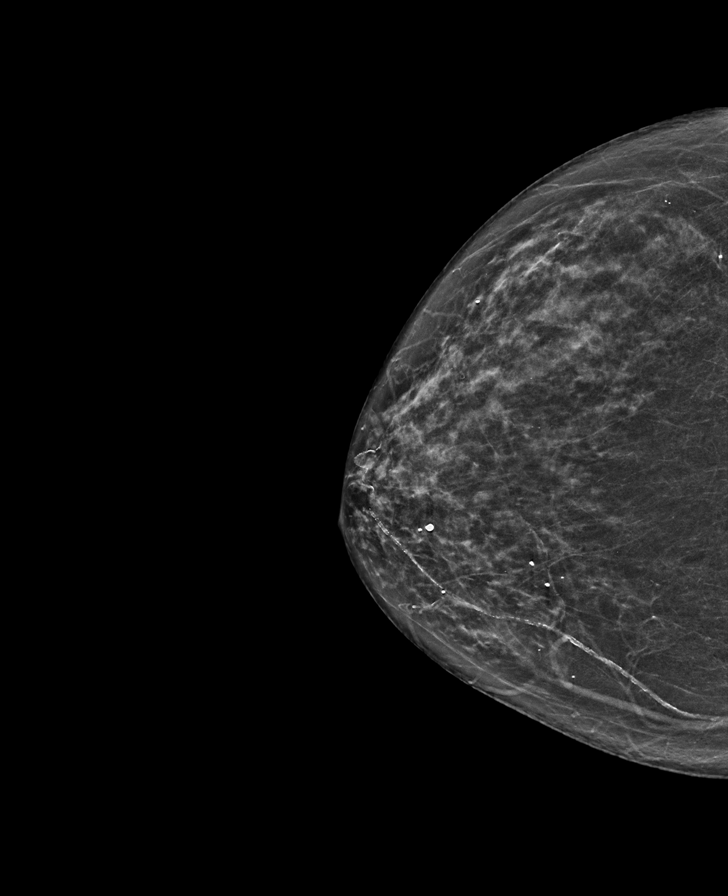

[L MLO synth-2D]
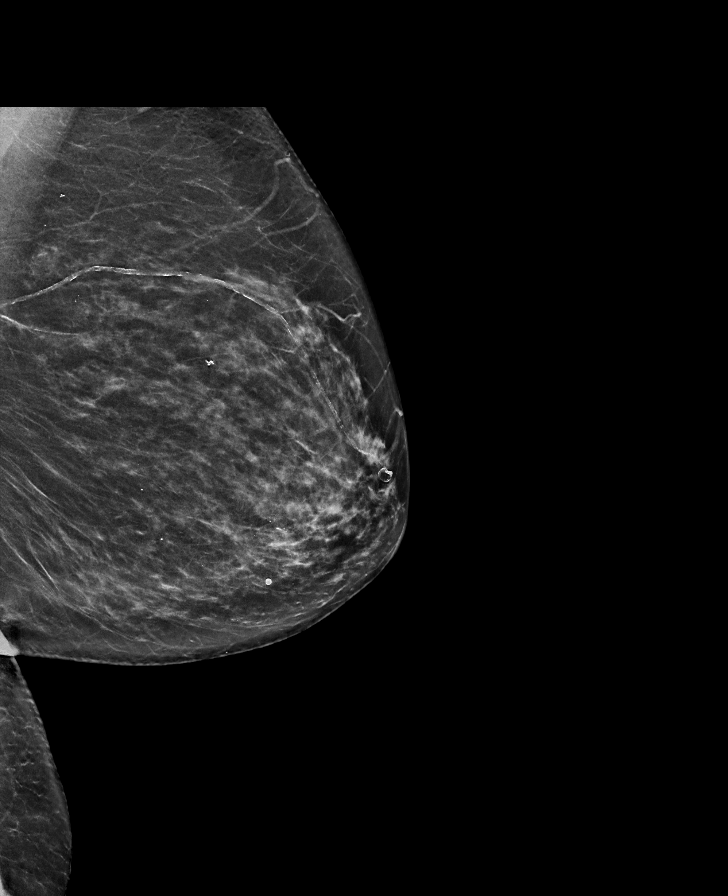

[R CC tomo · tomo slice 33/64.0]
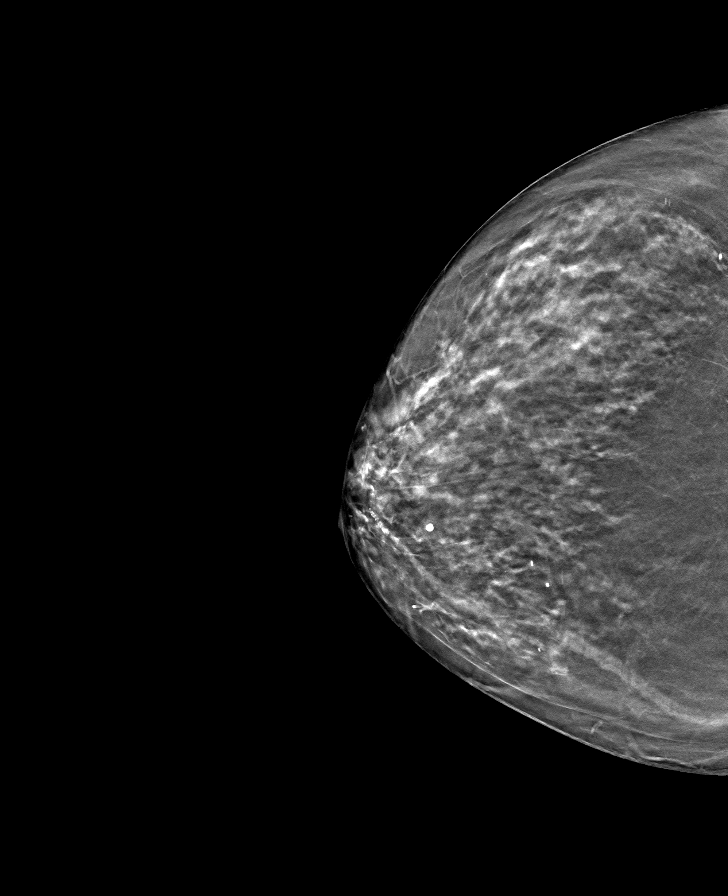

[R MLO tomo · tomo slice 35/68.0]
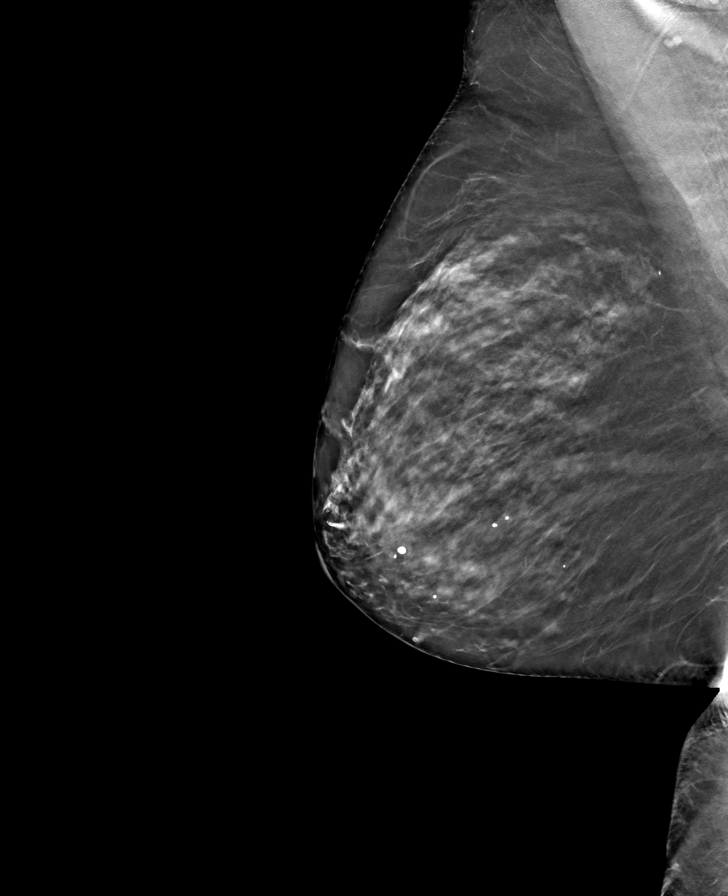

[L CC tomo · tomo slice 32/63.0]
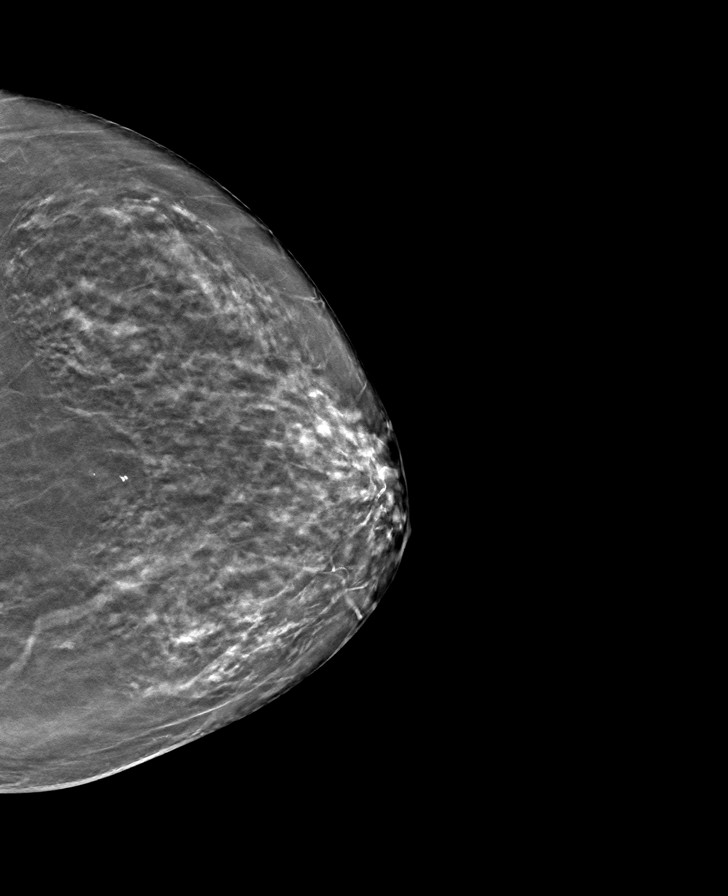

[L MLO tomo · tomo slice 35/69.0]
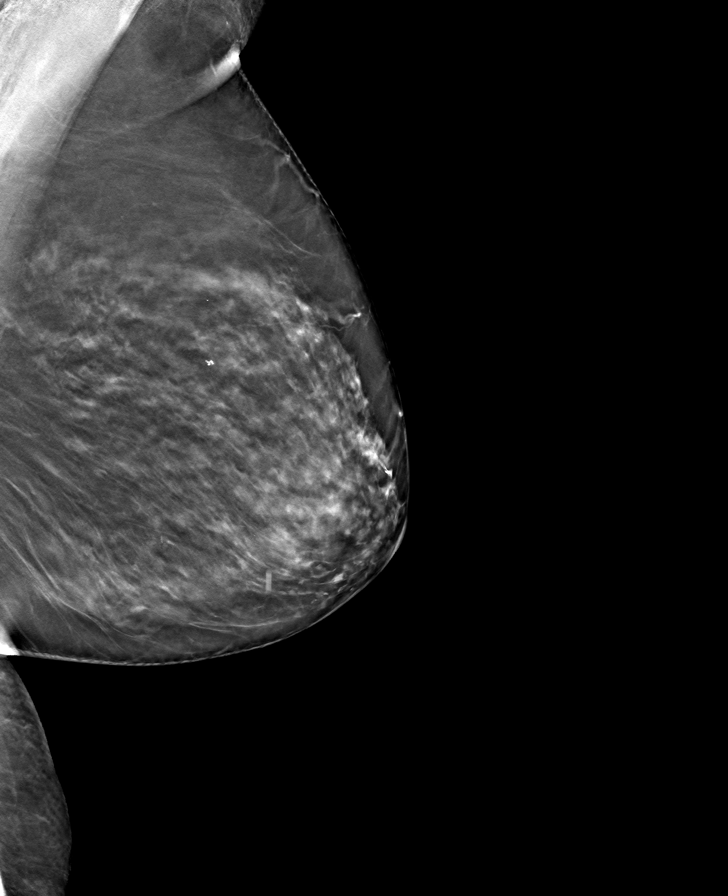

[8 of 24 positions shown; findings below may reference images not displayed]

ACR Breast Density Category c: The breast tissue is heterogeneously
dense, which may obscure small masses.
FINDINGS: There are no findings suspicious for malignancy. The images were
evaluated with computer-aided detection.
IMPRESSION: No mammographic evidence of malignancy. A result letter of this
screening mammogram will be mailed directly to the patient.

RECOMMENDATION:
Screening mammogram in one year. (Code:T4-5-GWO)

BI-RADS CATEGORY  1: Negative.

## 2021-07-31 ENCOUNTER — Telehealth: Payer: Self-pay | Admitting: Family Medicine

## 2021-07-31 NOTE — Telephone Encounter (Signed)
Done

## 2021-07-31 NOTE — Addendum Note (Signed)
Addended by: Rodrigo Ran on: 07/31/2021 11:47 AM   Modules accepted: Orders

## 2021-07-31 NOTE — Telephone Encounter (Signed)
Pt requesting that the location for referral [phone] be changed to [facility]

## 2021-08-09 ENCOUNTER — Ambulatory Visit
Admission: RE | Admit: 2021-08-09 | Discharge: 2021-08-09 | Disposition: A | Discharge status: Home / Self Care | Payer: Medicare HMO | Source: Ambulatory Visit | Attending: Family Medicine | Admitting: Family Medicine

## 2021-08-09 DIAGNOSIS — R208 Other disturbances of skin sensation: Secondary | ICD-10-CM

## 2021-08-09 DIAGNOSIS — R202 Paresthesia of skin: Secondary | ICD-10-CM

## 2021-08-09 DIAGNOSIS — G8929 Other chronic pain: Secondary | ICD-10-CM

## 2021-09-24 ENCOUNTER — Ambulatory Visit (INDEPENDENT_AMBULATORY_CARE_PROVIDER_SITE_OTHER): Payer: Medicare HMO

## 2021-09-24 DIAGNOSIS — M816 Localized osteoporosis [Lequesne]: Secondary | ICD-10-CM | POA: Diagnosis not present

## 2021-09-24 MED ORDER — DENOSUMAB 60 MG/ML ~~LOC~~ SOSY
60.0000 mg | PREFILLED_SYRINGE | Freq: Once | SUBCUTANEOUS | Status: AC
  Administered 2021-09-24: 60 mg via SUBCUTANEOUS

## 2021-09-24 NOTE — Progress Notes (Signed)
Per orders of Dr. Jerilee Hoh, injection of Denosumab 60 mg/ml given by Detavious Rinn L Tyna Huertas. Patient tolerated injection well.

## 2021-11-09 NOTE — Progress Notes (Signed)
HPI: [name] is a 81 y.o. female with medical hx significant for GERD,HLD,vit D def,and generalized OA here today for her routine physical.  Last CPE: 09/23/20.  She is taking vitamin D3-1000 and B12-1000 supplements and reports eating a healthy diet, mostly consisting of homegrown vegetables. She engages in regular physical activity, including walking and performing chores at home.  Since her last visit she has seen ortho for right shoulder pain and has received two corticosteroid injections with temporary relief. Still some limitation of ROM and pain. She underwent physical therapy for two months and continues to perform exercises at home. She has been taken Aleve for pain management.  Immunization History  Administered Date(s) Administered   Fluad Quad(high Dose 65+) 10/26/2021   Influenza Split 12/20/2003, 12/08/2004, 11/11/2005, 11/11/2006, 10/12/2007, 10/24/2008, 10/23/2009, 11/04/2010, 10/18/2011, 10/24/2019   Influenza, High Dose Seasonal PF 10/08/2014, 10/12/2017, 10/10/2018   Influenza,inj,Quad PF,6+ Mos 10/13/2015   Influenza-Unspecified 10/28/2020   Moderna Covid-19 Vaccine Bivalent Booster 29yr & up 11/04/2020   Moderna Sars-Covid-2 Vaccination 01/22/2019, 02/19/2019, 11/08/2019, 06/25/2020   Pneumococcal Conjugate-13 07/22/2014, 04/09/2016   Pneumococcal Polysaccharide-23 01/12/2011, 10/18/2011   Td 05/08/1991   Tdap 01/14/2011   Zoster, Live 01/12/2007   Health Maintenance  Topic Date Due   COVID-19 Vaccine (6 - Moderna series) 03/07/2021   TETANUS/TDAP  01/14/2022 (Originally 01/13/2021)   Zoster Vaccines- Shingrix (1 of 2) 01/16/2026 (Originally 02/13/1989)   Medicare Annual Wellness (AWV)  06/20/2022   Pneumonia Vaccine 81 Years old  Completed   INFLUENZA VACCINE  Completed   DEXA SCAN  Completed   HPV VACCINES  Aged Out   -HLD and aortic atherosclerosis: She is not longer taking Atorvastatin. She decreased medication to 2 times per week but  still causing LE's achy pain. Lab Results  Component Value Date   CHOL 177 07/06/2021   HDL 62.10 07/06/2021   LDLCALC 86 07/06/2021   TRIG 146.0 07/06/2021   CHOLHDL 3 07/06/2021   -Today she reports a tender lesion close to nasal corner of her eye,on nose bridge, for the past two to three weeks. She describes the lesion as a "knot" and experiences itching and pain upon touching the area.  Dry eye synd, she is on Restasis, not sure if it is helping, inquires about using artificial tears.  -Peripheral neuropathy: She is taking gabapentin as needed.She has found relief from discomfort, burning sensation, by applying Vicks VapoRub to her feet.  Review of Systems  Constitutional:  Negative for appetite change and fever.  HENT:  Negative for hearing loss, mouth sores and sore throat.   Eyes:  Negative for redness and visual disturbance.  Respiratory:  Negative for cough, shortness of breath and wheezing.   Cardiovascular:  Negative for chest pain and leg swelling.  Gastrointestinal:  Negative for abdominal pain, nausea and vomiting.       No changes in bowel habits.  Endocrine: Negative for cold intolerance, heat intolerance, polydipsia, polyphagia and polyuria.  Genitourinary:  Negative for decreased urine volume, dysuria, hematuria, vaginal bleeding and vaginal discharge.  Musculoskeletal:  Positive for arthralgias, back pain and joint swelling. Negative for gait problem.  Skin:  Negative for color change and rash.  Allergic/Immunologic: Positive for environmental allergies.  Neurological:  Negative for syncope, weakness and headaches.  Hematological:  Negative for adenopathy. Does not bruise/bleed easily.  Psychiatric/Behavioral:  Negative for confusion. The patient is nervous/anxious.   All other systems reviewed and are negative.  Current Outpatient Medications on File Prior to Visit  Medication Sig Dispense Refill   Acetaminophen (TYLENOL PO) Take 500 mg by mouth as needed.      Biotin 5 MG TABS Take by mouth.     Calcium-Vitamin D-Vitamin K 301-601-09 MG-UNT-MCG CHEW Chew by mouth.     Cholecalciferol (VITAMIN D3) 50 MCG (2000 UT) capsule Take by mouth.     cycloSPORINE (RESTASIS) 0.05 % ophthalmic emulsion Place 1 drop into both eyes 2 (two) times daily.     desonide (DESOWEN) 0.05 % cream Apply topically daily as needed. 30 g 0   Famotidine (PEPCID PO) Take by mouth daily.     polyethylene glycol (MIRALAX / GLYCOLAX) packet Take 17 g by mouth daily as needed. For constipation.     No current facility-administered medications on file prior to visit.   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   Blood in stool    Chicken pox    Chronic kidney disease    UTI and kidneystones   Constipation    unknown urology procedure 2006   Hiatal hernia    Kidney stones    Pneumonia 1968   Urinary tract infection    Past Surgical History:  Procedure Laterality Date   CATARACT EXTRACTION W/PHACO Right 02/24/2015   Procedure: CATARACT EXTRACTION PHACO AND INTRAOCULAR LENS PLACEMENT RIGHT EYE CDE=8.01;  Surgeon: Tonny Branch, MD;  Location: [facility];  Service: Ophthalmology;  Laterality: Right;   CATARACT EXTRACTION W/PHACO Left 04/03/2015   Procedure: CATARACT EXTRACTION PHACO AND INTRAOCULAR LENS PLACEMENT (IOC);  Surgeon: Tonny Branch, MD;  Location: [facility];  Service: Ophthalmology;  Laterality: Left;  CDE:11.74   DILATION AND CURETTAGE OF UTERUS     LITHOTRIPSY     TONSILLECTOMY  1951    Allergies  Allergen Reactions   Penicillins Other (See Comments)    Has patient had a PCN reaction causing immediate rash, facial/tongue/throat swelling, SOB or lightheadedness with hypotension: Yes Has patient had a PCN reaction causing severe rash involving mucus membranes or skin necrosis: No Has patient had a PCN reaction that required hospitalization No Has patient had a PCN reaction occurring within the last 10 years: No If all of  the above answers are "NO", then may proceed with Cephalosporin use. Passed out Has patient had a PCN reaction causing immediate rash, facial/tongue/throat swelling, SOB or lightheadedness with hypotension: Yes Has patient had a PCN reaction causing severe rash involving mucus membranes or skin necrosis: No Has patient had a PCN reaction that required hospitalization No Has patient had a PCN reaction occurring within the last 10 years: No If all of the above answers are "NO", then may proceed with Cephalosporin use. Passed out fainted Has patient had a PCN reaction causing immediate rash, facial/tongue/throat swelling, SOB or lightheadedness with hypotension: Yes Has patient had a PCN reaction causing severe rash involving mucus membranes or skin necrosis: No Has patient had a PCN reaction that required hospitalization No Has patient had a PCN reaction occurring within the last 10 years: No If all of the above answers are "NO", then may proceed with Cephalosporin use. Passed out fainted Has patient had a PCN reaction causing immediate rash, facial/tongue/throat swelling, SOB or lightheadedness with hypotension: Yes Has patient had a PCN reaction causing severe rash involving mucus membranes or skin necrosis: No Has patient had a PCN reaction that required hospitalization No Has patient had a PCN reaction occurring within the last 10 years:  No If all of the above answers are "NO", then may proceed with Cephalosporin use. Passed out Has patient had a PCN reaction causing immediate rash, facial/tongue/throat swelling, SOB or lightheadedness with hypotension: Yes Has patient had a PCN reaction causing severe rash involving mucus membranes or skin necrosis: No Has patient had a PCN reaction that required hospitalization No Has patient had a PCN reaction occurring within the last 10 years: No If all of the above answers are "NO", then may proceed with Cephalosporin use. Passed out fainted    Family History  Problem Relation Age of Onset   Breast cancer Maternal Aunt    Arthritis Mother    Hypertension Mother    Arthritis Father    Hypertension Father    Social History   Socioeconomic History   Marital status: Married    Spouse name: Not on file   Number of children: Not on file   Years of education: Not on file   Highest education level: Not on file  Occupational History   Not on file  Tobacco Use   Smoking status: Never   Smokeless tobacco: Never  Substance and Sexual Activity   Alcohol use: No   Drug use: No   Sexual activity: Not on file  Other Topics Concern   Not on file  Social History Narrative   Not on file   Social Determinants of Health   Financial Resource Strain: Low Risk  (06/19/2021)   Overall Financial Resource Strain (CARDIA)    Difficulty of Paying Living Expenses: Not hard at all  Food Insecurity: No Food Insecurity (06/19/2021)   Hunger Vital Sign    Worried About Running Out of Food in the Last Year: Never true    [place] in the Last Year: Never true  Transportation Needs: No Transportation Needs (06/19/2021)   PRAPARE - Hydrologist (Medical): No    Lack of Transportation (Non-Medical): No  Physical Activity: Insufficiently Active (06/19/2021)   Exercise Vital Sign    Days of Exercise per Week: 7 days    Minutes of Exercise per Session: 10 min  Stress: No Stress Concern Present (06/19/2021)   [place]    Feeling of Stress : Not at all  Social Connections: [place] (06/19/2021)   Social Connection and Isolation Panel [NHANES]    Frequency of Communication with Friends and Family: More than three times a week    Frequency of Social Gatherings with Friends and Family: More than three times a week    Attends Religious Services: More than 4 times per year    Active Member of [employer] or Organizations: Yes    Attends Theatre manager Meetings: Not on file    Marital Status: Married   Vitals:   11/10/21 0755  BP: 110/70  Pulse: 70  Resp: 16  Temp: 98.3 F (36.8 C)  SpO2: 97%  Body mass index is 26.85 kg/m. Wt Readings from Last 3 Encounters:  11/10/21 166 lb 6 oz (75.5 kg)  07/06/21 164 lb 4 oz (74.5 kg)  06/19/21 163 lb 6.4 oz (74.1 kg)   Physical Exam Vitals and nursing note reviewed.  Constitutional:      General: She is not in acute distress.    Appearance: She is well-developed and well-groomed.  HENT:     Head: Normocephalic and atraumatic.     Right Ear: External ear normal.     Left  Ear: External ear normal.     Ears:     Comments: Execess cerumen bilateral, R>L. Hearing aids.    Nose:      Mouth/Throat:     Mouth: Mucous membranes are moist.     Pharynx: Oropharynx is clear. Uvula midline.  Eyes:     Conjunctiva/sclera: Conjunctivae normal.     Pupils: Pupils are equal, round, and reactive to light.  Neck:     Thyroid: No thyroid mass.     Trachea: No tracheal deviation.  Cardiovascular:     Rate and Rhythm: Normal rate and regular rhythm.     Pulses:          Dorsalis pedis pulses are 2+ on the right side and 2+ on the left side.     Heart sounds: No murmur heard. Pulmonary:     Effort: Pulmonary effort is normal. No respiratory distress.     Breath sounds: Normal breath sounds.  Abdominal:     Palpations: Abdomen is soft. There is no hepatomegaly or mass.     Tenderness: There is no abdominal tenderness.  Musculoskeletal:     Right shoulder: No bony tenderness. Decreased range of motion.     Comments: No signs of synovitis appreciated.  Lymphadenopathy:     Cervical: No cervical adenopathy.  Skin:    General: Skin is warm.     Findings: No erythema or rash.  Neurological:     General: No focal deficit present.     Mental Status: She is alert and oriented to person, place, and time.     Cranial Nerves: No cranial nerve deficit.     Coordination: Coordination  normal.     Gait: Gait normal.     Deep Tendon Reflexes:     Reflex Scores:      Bicep reflexes are 2+ on the right side and 2+ on the left side.      Patellar reflexes are 2+ on the right side and 2+ on the left side. Psychiatric:        Mood and Affect: Mood and affect normal.   ASSESSMENT AND PLAN:  Ms. Tanya Swanson was here today annual physical examination.  Orders Placed This Encounter  Procedures   Basic metabolic panel   Lab Results  Component Value Date   CREATININE 0.79 11/10/2021   BUN 10 11/10/2021   NA 141 11/10/2021   K 4.4 11/10/2021   CL 106 11/10/2021   CO2 27 11/10/2021   Routine general medical examination at a health care facility We discussed the importance of regular physical activity and healthy diet for prevention of chronic illness and/or complications. Preventive guidelines reviewed. Vaccination up to date. Ca++ and vit D supplementation to continue. Next CPE in a year.  Chronic right shoulder pain Improved with treatment and PT. Continue ROM exercises. Caution with Aleve, she can try Voltaren gel qid.  Statin myopathy Discontinued.  Atherosclerosis of aorta (HCC) Atorvastatin caused myalgias.  Skin lesion of face Close to nasal angle left eye, mildly tender. Recommend topical abx and local heat. Monitor for new symptoms. Instructed about warning signs.  In regard to dry eye synd, she can add natural tears and use as needed. She is on Restasis. Continue following with eye care provider.  -     erythromycin ophthalmic ointment; Apply on affected area tid for 10 days.  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 Topical Vick vapor rub seems to help, so continue. Discontinue Gabapentin. Appropriate feet care.  Return in 1 year (on 11/11/2022) for cpe and f/u.  Sopheap Boehle G. [place], MD  [facility] [place] office.

## 2021-11-10 ENCOUNTER — Encounter: Payer: Self-pay | Admitting: Family Medicine

## 2021-11-10 ENCOUNTER — Ambulatory Visit (INDEPENDENT_AMBULATORY_CARE_PROVIDER_SITE_OTHER): Payer: Medicare HMO | Admitting: Family Medicine

## 2021-11-10 VITALS — BP 110/70 | HR 70 | Temp 98.3°F | Resp 16 | Ht 66.0 in | Wt 166.4 lb

## 2021-11-10 DIAGNOSIS — M25511 Pain in right shoulder: Secondary | ICD-10-CM

## 2021-11-10 DIAGNOSIS — I700 Atherosclerosis of aorta: Secondary | ICD-10-CM

## 2021-11-10 DIAGNOSIS — G720 Drug-induced myopathy: Secondary | ICD-10-CM | POA: Diagnosis not present

## 2021-11-10 DIAGNOSIS — L989 Disorder of the skin and subcutaneous tissue, unspecified: Secondary | ICD-10-CM

## 2021-11-10 DIAGNOSIS — G8929 Other chronic pain: Secondary | ICD-10-CM

## 2021-11-10 DIAGNOSIS — T466X5A Adverse effect of antihyperlipidemic and antiarteriosclerotic drugs, initial encounter: Secondary | ICD-10-CM

## 2021-11-10 DIAGNOSIS — Z0000 Encounter for general adult medical examination without abnormal findings: Secondary | ICD-10-CM | POA: Diagnosis not present

## 2021-11-10 DIAGNOSIS — R202 Paresthesia of skin: Secondary | ICD-10-CM

## 2021-11-10 LAB — BASIC METABOLIC PANEL
BUN: 10 mg/dL (ref 6–23)
CO2: 27 mEq/L (ref 19–32)
Calcium: 9.3 mg/dL (ref 8.4–10.5)
Chloride: 106 mEq/L (ref 96–112)
Creatinine, Ser: 0.79 mg/dL (ref 0.40–1.20)
GFR: 69.5 mL/min (ref >60.00)
Glucose, Bld: 95 mg/dL (ref 70–99)
Potassium: 4.4 mEq/L (ref 3.5–5.1)
Sodium: 141 mEq/L (ref 135–145)

## 2021-11-10 MED ORDER — ERYTHROMYCIN 5 MG/GM OP OINT: TOPICAL_OINTMENT | OPHTHALMIC | 0 refills | Status: AC

## 2021-11-10 NOTE — Patient Instructions (Addendum)
A few things to remember from today's visit:  Routine general medical examination at a health care facility  Chronic right shoulder pain  Statin myopathy  Atherosclerosis of aorta (HCC) - Plan: Basic metabolic panel  Skin lesion of face - Plan: erythromycin ophthalmic ointment  Paresthesia of both lower extremities  Voltaren gel for right shoulder pain. If it does not help, Aleve 220 mg daily as needed. Antibiotic ointment on lesion close to eye and local heat. Arrange appt with your eye care provider, lesion may need to be removed surgically.  Do not use My Chart to request refills or for acute issues that need immediate attention. If you send a my chart message, it may take a few days to be addressed, specially if I am not in the office.  Please be sure medication list is accurate. If a new problem present, please set up appointment sooner than planned today. Preventive Care 12 Years and Older, Female Preventive care refers to lifestyle choices and visits with your health care provider that can promote health and wellness. Preventive care visits are also called wellness exams. What can I expect for my preventive care visit? Counseling Your health care provider may ask you questions about your: Medical history, including: Past medical problems. Family medical history. Pregnancy and menstrual history. History of falls. Current health, including: Memory and ability to understand (cognition). Emotional well-being. Home life and relationship well-being. Sexual activity and sexual health. Lifestyle, including: Alcohol, nicotine or tobacco, and drug use. Access to firearms. Diet, exercise, and sleep habits. Work and work Statistician. Sunscreen use. Safety issues such as seatbelt and bike helmet use. Physical exam Your health care provider will check your: Height and weight. These may be used to calculate your BMI (body mass index). BMI is a measurement that tells if you are  at a healthy weight. Waist circumference. This measures the distance around your waistline. This measurement also tells if you are at a healthy weight and may help predict your risk of certain diseases, such as type 2 diabetes and high blood pressure. Heart rate and blood pressure. Body temperature. Skin for abnormal spots. What immunizations do I need?  Vaccines are usually given at various ages, according to a schedule. Your health care provider will recommend vaccines for you based on your age, medical history, and lifestyle or other factors, such as travel or where you work. What tests do I need? Screening Your health care provider may recommend screening tests for certain conditions. This may include: Lipid and cholesterol levels. Hepatitis C test. Hepatitis B test. HIV (human immunodeficiency virus) test. STI (sexually transmitted infection) testing, if you are at risk. Lung cancer screening. Colorectal cancer screening. Diabetes screening. This is done by checking your blood sugar (glucose) after you have not eaten for a while (fasting). Mammogram. Talk with your health care provider about how often you should have regular mammograms. BRCA-related cancer screening. This may be done if you have a family history of breast, ovarian, tubal, or peritoneal cancers. Bone density scan. This is done to screen for osteoporosis. Talk with your health care provider about your test results, treatment options, and if necessary, the need for more tests. Follow these instructions at home: Eating and drinking  Eat a diet that includes fresh fruits and vegetables, whole grains, lean protein, and low-fat dairy products. Limit your intake of foods with high amounts of sugar, saturated fats, and salt. Take vitamin and mineral supplements as recommended by your health care provider. Do not drink  alcohol if your health care provider tells you not to drink. If you drink alcohol: Limit how much you have  to 0-1 drink a day. Know how much alcohol is in your drink. In the [place] one drink equals one 12 oz bottle of beer (355 mL), one 5 oz glass of wine (148 mL), or one 1 oz glass of hard liquor (44 mL). Lifestyle Brush your teeth every morning and night with fluoride toothpaste. Floss one time each day. Exercise for at least 30 minutes 5 or more days each week. Do not use any products that contain nicotine or tobacco. These products include cigarettes, chewing tobacco, and vaping devices, such as e-cigarettes. If you need help quitting, ask your health care provider. Do not use drugs. If you are sexually active, practice safe sex. Use a condom or other form of protection in order to prevent STIs. Take aspirin only as told by your health care provider. Make sure that you understand how much to take and what form to take. Work with your health care provider to find out whether it is safe and beneficial for you to take aspirin daily. Ask your health care provider if you need to take a cholesterol-lowering medicine (statin). Find healthy ways to manage stress, such as: Meditation, yoga, or listening to music. Journaling. Talking to a trusted person. Spending time with friends and family. Minimize exposure to UV radiation to reduce your risk of skin cancer. Safety Always wear your seat belt while driving or riding in a vehicle. Do not drive: If you have been drinking alcohol. Do not ride with someone who has been drinking. When you are tired or distracted. While texting. If you have been using any mind-altering substances or drugs. Wear a helmet and other protective equipment during sports activities. If you have firearms in your house, make sure you follow all gun safety procedures. What's next? Visit your health care provider once a year for an annual wellness visit. Ask your health care provider how often you should have your eyes and teeth checked. Stay up to date on all vaccines. This  information is not intended to replace advice given to you by your health care provider. Make sure you discuss any questions you have with your health care provider. Document Revised: 06/25/2020 Document Reviewed: 06/25/2020 Elsevier Patient Education  [place].

## 2021-12-08 ENCOUNTER — Ambulatory Visit: Payer: Medicare HMO | Admitting: Adult Health

## 2021-12-29 ENCOUNTER — Other Ambulatory Visit: Payer: Self-pay | Admitting: Optometry

## 2021-12-29 DIAGNOSIS — J340 Abscess, furuncle and carbuncle of nose: Secondary | ICD-10-CM

## 2021-12-29 NOTE — Progress Notes (Signed)
CT face ordered

## 2022-01-05 ENCOUNTER — Ambulatory Visit (HOSPITAL_COMMUNITY)
Admission: RE | Admit: 2022-01-05 | Discharge: 2022-01-05 | Disposition: A | Discharge status: Home / Self Care | Payer: Medicare HMO | Source: Ambulatory Visit | Attending: Optometry | Admitting: Optometry

## 2022-01-05 DIAGNOSIS — J340 Abscess, furuncle and carbuncle of nose: Secondary | ICD-10-CM | POA: Diagnosis present

## 2022-01-05 MED ORDER — IOHEXOL 300 MG/ML  SOLN
80.0000 mL | Freq: Once | INTRAMUSCULAR | Status: AC | PRN
  Administered 2022-01-05: 80 mL via INTRAVENOUS

## 2022-01-05 MED ORDER — SODIUM CHLORIDE (PF) 0.9 % IJ SOLN
INTRAMUSCULAR | Status: AC
  Filled 2022-01-05: qty 50

## 2022-03-30 NOTE — Progress Notes (Signed)
ACUTE VISIT Chief Complaint  Patient presents with   Ear Fullness   HPI: [name] Plan Auzenne is a 82 y.o. female, who is here today complaining of decreased hearing in her left ear, ongoing for the past couple of months. She notes no associated pain or drainage from the affected ear.  She reports that she had previously undergone ear cleaning for her right ear and mentions an upcoming follow-up appointment with her  audiologist scheduled for May/2024. She does not report any discomfort or additional symptoms related to her hearing condition. Ear Fullness  There is pain in the left ear. The current episode started 1 to 4 weeks ago. The problem has been unchanged. There has been no fever. The pain is at a severity of 0/10. The patient is experiencing no pain. Associated symptoms include hearing loss. Pertinent negatives include no abdominal pain, coughing, ear discharge, headaches, neck pain, rash, rhinorrhea, sore throat or vomiting. She has tried nothing for the symptoms. Her past medical history is significant for hearing loss.   Since her last visit she has seen her ophthalmologist for left periocular,  near the nasal angle of her left eye, diagnosed as a dilated left lacrimal sac. According to pt, there was a concerned about possible malignancy and CT was obtained and referred to ophthalmologist in [place] . She was prescribed topical treatment and lesion has resolved. 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  C/O epiphora and eye pruritus. She would like to know if there is an OTC treatment she can try. Negative for purulent drainage,or visual changes.  Osteoporosis: She is due for her Prolia injection today. She has tolerated well, no side effects. 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 25 OH vit D in 09/2020 was 82. No Hx of CKD.  Lab Results  Component Value Date    CREATININE 0.79 11/10/2021   BUN 10 11/10/2021   NA 141 11/10/2021   K 4.4 11/10/2021   CL 106 11/10/2021   CO2 27 11/10/2021   Review of Systems  HENT:  Positive for hearing loss. Negative for ear discharge, rhinorrhea and sore throat.   Respiratory:  Negative for cough, shortness of breath and wheezing.   Gastrointestinal:  Negative for abdominal pain and vomiting.  Musculoskeletal:  Negative for neck pain.  Skin:  Negative for rash.  Neurological:  Negative for headaches.  See other pertinent positives and negatives in HPI.  Current Outpatient Medications on File Prior to Visit  Medication Sig Dispense Refill   Acetaminophen (TYLENOL PO) Take 500 mg by mouth as needed.     Biotin 5 MG TABS Take by mouth.     Calcium-Vitamin D-Vitamin K [id number] MG-UNT-MCG CHEW Chew by mouth.     Cholecalciferol (VITAMIN D3) 50 MCG (2000 UT) capsule Take by mouth.     cycloSPORINE (RESTASIS) 0.05 % ophthalmic emulsion Place 1 drop into both eyes 2 (two) times daily.     desonide (DESOWEN) 0.05 % cream Apply topically daily as needed. 30 g 0   Famotidine (PEPCID PO) Take by mouth daily.     polyethylene glycol (MIRALAX / GLYCOLAX) packet Take 17 g by mouth daily as needed. For constipation.     No current facility-administered medications on file prior to visit.   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   Blood in  stool    Chicken pox    Chronic kidney disease    UTI and kidneystones   Constipation    unknown urology procedure 2006   Hiatal hernia    Kidney stones    Pneumonia 1968   Urinary tract infection    Allergies  Allergen Reactions   Penicillins Other (See Comments)    Has patient had a PCN reaction causing immediate rash, facial/tongue/throat swelling, SOB or lightheadedness with hypotension: Yes Has patient had a PCN reaction causing severe rash involving mucus membranes or skin necrosis: No Has patient had a PCN  reaction that required hospitalization No Has patient had a PCN reaction occurring within the last 10 years: No If all of the above answers are "NO", then may proceed with Cephalosporin use. Passed out Has patient had a PCN reaction causing immediate rash, facial/tongue/throat swelling, SOB or lightheadedness with hypotension: Yes Has patient had a PCN reaction causing severe rash involving mucus membranes or skin necrosis: No Has patient had a PCN reaction that required hospitalization No Has patient had a PCN reaction occurring within the last 10 years: No If all of the above answers are "NO", then may proceed with Cephalosporin use. Passed out fainted Has patient had a PCN reaction causing immediate rash, facial/tongue/throat swelling, SOB or lightheadedness with hypotension: Yes Has patient had a PCN reaction causing severe rash involving mucus membranes or skin necrosis: No Has patient had a PCN reaction that required hospitalization No Has patient had a PCN reaction occurring within the last 10 years: No If all of the above answers are "NO", then may proceed with Cephalosporin use. Passed out fainted Has patient had a PCN reaction causing immediate rash, facial/tongue/throat swelling, SOB or lightheadedness with hypotension: Yes Has patient had a PCN reaction causing severe rash involving mucus membranes or skin necrosis: No Has patient had a PCN reaction that required hospitalization No Has patient had a PCN reaction occurring within the last 10 years: No If all of the above answers are "NO", then may proceed with Cephalosporin use. Passed out Has patient had a PCN reaction causing immediate rash, facial/tongue/throat swelling, SOB or lightheadedness with hypotension: Yes Has patient had a PCN reaction causing severe rash involving mucus membranes or skin necrosis: No Has patient had a PCN reaction that required hospitalization No Has patient had a PCN reaction occurring within the  last 10 years: No If all of the above answers are "NO", then may proceed with Cephalosporin use. Passed out fainted    Social History   Socioeconomic History   Marital status: Married    Spouse name: Not on file   Number of children: Not on file   Years of education: Not on file   Highest education level: Not on file  Occupational History   Not on file  Tobacco Use   Smoking status: Never   Smokeless tobacco: Never  Substance and Sexual Activity   Alcohol use: No   Drug use: No   Sexual activity: Not on file  Other Topics Concern   Not on file  Social History Narrative   Not on file   Social Determinants of Health   Financial Resource Strain: Low Risk  (06/19/2021)   Overall Financial Resource Strain (CARDIA)    Difficulty of Paying Living Expenses: Not hard at all  Food Insecurity: No Food Insecurity (06/19/2021)   Hunger Vital Sign    Worried About Running Out of Food in the Last Year: Never true    Ran  Out of Food in the Last Year: Never true  Transportation Needs: No Transportation Needs (06/19/2021)   PRAPARE - Hydrologist (Medical): No    Lack of Transportation (Non-Medical): No  Physical Activity: Insufficiently Active (06/19/2021)   Exercise Vital Sign    Days of Exercise per Week: 7 days    Minutes of Exercise per Session: 10 min  Stress: No Stress Concern Present (06/19/2021)   [place]    Feeling of Stress : Not at all  Social Connections: [place] (06/19/2021)   Social Connection and Isolation Panel [NHANES]    Frequency of Communication with Friends and Family: More than three times a week    Frequency of Social Gatherings with Friends and Family: More than three times a week    Attends Religious Services: More than 4 times per year    Active Member of [employer] or Organizations: Yes    Attends Archivist Meetings: Not on file    Marital Status:  Married   Vitals:   03/31/22 1211  BP: 120/70  Pulse: 100  Resp: 16  SpO2: 97%   Body mass index is 26.53 kg/m.  Physical Exam Vitals and nursing note reviewed.  Constitutional:      General: She is not in acute distress.    Appearance: She is well-developed. She is not ill-appearing.  HENT:     Head: Normocephalic and atraumatic.     Right Ear: External ear normal.     Left Ear: External ear normal.     Ears:     Comments: Excess cerumen bilateral. Right TM seen partially. Left ear canal cerumen impaction. Hearing aids bilateral. Eyes:     Conjunctiva/sclera: Conjunctivae normal.  Cardiovascular:     Rate and Rhythm: Normal rate and regular rhythm.     Heart sounds: No murmur heard. Pulmonary:     Effort: Pulmonary effort is normal. No respiratory distress.     Breath sounds: Normal breath sounds. No stridor.  Musculoskeletal:     Cervical back: No edema or erythema. No muscular tenderness.  Lymphadenopathy:     Head:     Right side of head: No submandibular adenopathy.     Left side of head: No submandibular adenopathy.     Cervical: No cervical adenopathy.  Skin:    General: Skin is warm.     Findings: No erythema or rash.  Neurological:     Mental Status: She is alert and oriented to person, place, and time.  Psychiatric:        Mood and Affect: Mood and affect normal.   ASSESSMENT AND PLAN: Ms. Sancen was seen today for left worsening hearing loss,eye pruritus,and osteoporosis.  Hearing loss due to cerumen impaction, left After verbal consent and discussion of pros and cons of procedure, she wants to proceed.  Ear Cerumen Removal  Date/Time: 03/31/2022 8:36 PM  Performed by: Martinique, Icel Castles G, MD Authorized by: Martinique, Kaide Gage G, MD   Anesthesia: Local Anesthetic: none Ceruminolytics applied: Ceruminolytics applied prior to the procedure. Location details: left ear Patient tolerance: patient tolerated the procedure well with no immediate  complications Comments: Most cerumen removed, hearing improved. TM with no erythema. Procedure type: irrigation  Sedation: Patient sedated: no    Allergic conjunctivitis of both eyes OTC Zaditor 1 drop in each eye daily.  Localized osteoporosis without current pathological fracture Continue Prolia q 6 months. 16 OH vot D with next labs.  Continue adequate calcium and vit D intake as well as fall prevention and regular physical activity.  -     Denosumab  Return if symptoms worsen or fail to improve, for keep next appointment.  Gustavia Carie G. Martinique, MD  [facility]. [place] office.

## 2022-03-31 ENCOUNTER — Encounter: Payer: Self-pay | Admitting: Family Medicine

## 2022-03-31 ENCOUNTER — Ambulatory Visit (INDEPENDENT_AMBULATORY_CARE_PROVIDER_SITE_OTHER): Payer: Medicare HMO | Admitting: Family Medicine

## 2022-03-31 VITALS — BP 120/70 | HR 100 | Resp 16 | Ht 66.0 in | Wt 164.4 lb

## 2022-03-31 DIAGNOSIS — H1013 Acute atopic conjunctivitis, bilateral: Secondary | ICD-10-CM | POA: Diagnosis not present

## 2022-03-31 DIAGNOSIS — H6122 Impacted cerumen, left ear: Secondary | ICD-10-CM | POA: Diagnosis not present

## 2022-03-31 DIAGNOSIS — M816 Localized osteoporosis [Lequesne]: Secondary | ICD-10-CM | POA: Diagnosis not present

## 2022-03-31 MED ORDER — DENOSUMAB 60 MG/ML ~~LOC~~ SOSY
60.0000 mg | PREFILLED_SYRINGE | Freq: Once | SUBCUTANEOUS | Status: AC
  Administered 2022-03-31: 60 mg via SUBCUTANEOUS

## 2022-03-31 NOTE — Patient Instructions (Addendum)
A few things to remember from today's visit:  Localized osteoporosis without current pathological fracture - Plan: denosumab (PROLIA) injection 60 mg  Hearing loss due to cerumen impaction, left  Allergic conjunctivitis of both eyes  Zaditor eye drops over the counter 1 drop daily as needed for eye allergies.  If you need refills for medications you take chronically, please call your pharmacy. Do not use My Chart to request refills or for acute issues that need immediate attention. If you send a my chart message, it may take a few days to be addressed, specially if I am not in the office.  Please be sure medication list is accurate. If a new problem present, please set up appointment sooner than planned today.

## 2022-06-01 ENCOUNTER — Other Ambulatory Visit: Payer: Self-pay | Admitting: Family Medicine

## 2022-06-01 DIAGNOSIS — Z1231 Encounter for screening mammogram for malignant neoplasm of breast: Secondary | ICD-10-CM

## 2022-06-17 ENCOUNTER — Encounter: Payer: Self-pay | Admitting: Family Medicine

## 2022-06-17 ENCOUNTER — Ambulatory Visit (INDEPENDENT_AMBULATORY_CARE_PROVIDER_SITE_OTHER): Payer: Medicare HMO | Admitting: Family Medicine

## 2022-06-17 VITALS — Wt 164.0 lb

## 2022-06-17 DIAGNOSIS — Z0000 Encounter for general adult medical examination without abnormal findings: Secondary | ICD-10-CM

## 2022-06-17 NOTE — Patient Instructions (Signed)
I really enjoyed getting to talk with you today! I am available on Tuesdays and Thursdays for virtual visits if you have any questions or concerns, or if I can be of any further assistance.   CHECKLIST FROM ANNUAL WELLNESS VISIT:  -Follow up (please call to schedule if not scheduled after visit):   -yearly for annual wellness visit with primary care office  Here is a list of your preventive care/health maintenance measures and the plan for each if any are due:  PLAN For any measures below that may be due:   Health Maintenance  Topic Date Due   COVID-19 Vaccine (6 - 2023-24 season) 07/03/2022 (Originally 09/11/2021)   Zoster Vaccines- Shingrix (1 of 2) 01/16/2026 (Originally 02/13/1990)   INFLUENZA VACCINE  08/12/2022   Medicare Annual Wellness (AWV)  06/17/2023   Pneumonia Vaccine 19+ Years old  Completed   DEXA SCAN  Completed   HPV VACCINES  Aged Out   DTaP/Tdap/Td  Discontinued    -See a dentist at least yearly  -Get your eyes checked and then per your eye specialist's recommendations  -Other issues addressed today:   -I have included below further information regarding a healthy whole foods based diet, physical activity guidelines for adults, stress management and opportunities for social connections. I hope you find this information useful.   -----------------------------------------------------------------------------------------------------------------------------------------------------------------------------------------------------------------------------------------------------------  NUTRITION: -eat real food: lots of colorful vegetables (half the plate) and fruits -5-7 servings of vegetables and fruits per day (fresh or steamed is best), exp. 2 servings of vegetables with lunch and dinner and 2 servings of fruit per day. Berries and greens such as kale and collards are great choices.  -consume on a regular basis: whole grains (make sure first ingredient on label  contains the word "whole"), fresh fruits, fish, nuts, seeds, healthy oils (such as olive oil, avocado oil, grape seed oil) -may eat small amounts of dairy and lean meat on occasion, but avoid processed meats such as ham, bacon, lunch meat, etc. -drink water -try to avoid fast food and pre-packaged foods, processed meat -most experts advise limiting sodium to < 2300mg  per day, should limit further is any chronic conditions such as high blood pressure, heart disease, diabetes, etc. The American Heart Association advised that < 1500mg  is is ideal -try to avoid foods that contain any ingredients with names you do not recognize  -try to avoid sugar/sweets (except for the natural sugar that occurs in fresh fruit) -try to avoid sweet drinks -try to avoid white rice, white bread, pasta (unless whole grain), white or yellow potatoes  EXERCISE GUIDELINES FOR ADULTS: -if you wish to increase your physical activity, do so gradually and with the approval of your doctor -STOP and seek medical care immediately if you have any chest pain, chest discomfort or trouble breathing when starting or increasing exercise  -move and stretch your body, legs, feet and arms when sitting for long periods -Physical activity guidelines for optimal health in adults: -least 150 minutes per week of aerobic exercise (can talk, but not sing) once approved by your doctor, 20-30 minutes of sustained activity or two 10 minute episodes of sustained activity every day.  -resistance training at least 2 days per week if approved by your doctor -balance exercises 3+ days per week:   Stand somewhere where you have something sturdy to hold onto if you lose balance.    1) lift up on toes, start with 5x per day and work up to 20x   2) stand and lift on  leg straight out to the side so that foot is a few inches of the floor, start with 5x each side and work up to 20x each side   3) stand on one foot, start with 5 seconds each side and work up to  20 seconds on each side  If you need ideas or help with getting more active:  -Silver sneakers https://tools.silversneakers.com  -Walk with a Doc: http://www.duncan-williams.com/  -try to include resistance (weight lifting/strength building) and balance exercises twice per week: or the following link for ideas: http://castillo-powell.com/  BuyDucts.dk  STRESS MANAGEMENT: -can try meditating, or just sitting quietly with deep breathing while intentionally relaxing all parts of your body for 5 minutes daily -if you need further help with stress, anxiety or depression please follow up with your primary doctor or contact the wonderful folks at [facility]: [phone]  SOCIAL CONNECTIONS: -options in [place] if you wish to engage in more social and exercise related activities:  -Silver sneakers https://tools.silversneakers.com  -Walk with a Doc: http://www.duncan-williams.com/  -Check out the [facility] Active Adults 50+ section on the [place] [employer] (hiking clubs, book clubs, cards and games, chess, exercise classes, aquatic classes and much more) - see the website for details: https://www.[place].gov/departments/parks-recreation/active-adults50  -YouTube has lots of exercise videos for different ages and abilities as well  -[facility] (a variety of indoor and outdoor inperson activities for adults). 516-534-6621. [address]  -Virtual Online Classes (a variety of topics): see seniorplanet.org or call 5165324112  -consider volunteering at a school, hospice center, church, senior center or elsewhere

## 2022-06-17 NOTE — Progress Notes (Signed)
PATIENT CHECK-IN and HEALTH RISK ASSESSMENT QUESTIONNAIRE:  -completed by phone/video for upcoming Medicare Preventive Visit  Pre-Visit Check-in: 1)Vitals (height, wt, BP, etc) - record in vitals section for visit on day of visit 2)Review and Update Medications, Allergies PMH, Surgeries, Social history in Epic 3)Hospitalizations in the last year with date/reason? No  4)Review and Update Care Team (patient's specialists) in Epic 5) Complete PHQ9 in Epic  6) Complete Fall Screening in Epic 7)Review all Health Maintenance Due and order under PCP if not done.  8)Medicare Wellness Questionnaire: Answer theses question about your habits: Do you drink alcohol? No  Have you ever smoked?no  How many packs a day do/did you smoke? no Do you use smokeless tobacco?no Do you use an illicit drugs?no Do you exercises? Tries to walk every day for 5-10 mins. Works in the garden.  Are you sexually active? No  Typical breakfast: corn flakes cereal, blue berry muffin this a.m; sometimes oatmeal or egg, sausage biscuit. Typical lunch: today- cabbage, cheese and crackers Typical dinner: recently ham, lime bean, casserole, pasta salad, corn,tomatoe salad.  Typical snacks:watermelon, strawberry, bag of chip.  Beverages: water, green tea, crystalite water, diet mnt dew  Answer theses question about you: Can you perform most household chores?yes Do you find it hard to follow a conversation in a noisy room?yes , has hearing aid Do you often ask people to speak up or repeat themselves?yes Do you feel that you have a problem with memory?no Do you balance your checkbook and or bank acounts?yes Do you feel safe at home?yes Last dentist visit?last March, go every 6 months.  Do you need assistance with any of the following: Please note if so No  Driving?  Feeding yourself?  Getting from bed to chair?  Getting to the toilet?  Bathing or showering?  Dressing yourself?  Managing money?  Climbing a flight of  stairs   Preparing meals?  Do you have Advanced Directives in place (Living Will, Healthcare Power or Attorney)? Yes to Living Will but no Power of Attorney yet.    Last eye Exam and location?Last November 2023 in [place]   Do you currently use prescribed or non-prescribed narcotic or opioid pain medications?No  Do you have a history or close family history of breast, ovarian, tubal or peritoneal cancer or a family member with BRCA (breast cancer susceptibility 1 and 2) gene mutations? No  Nurse/Assistant Credentials/time stamp: Karpuih M./CMA/3:27pm   ----------------------------------------------------------------------------------------------------------------------------------------------------------------------------------------------------------------------   MEDICARE ANNUAL PREVENTIVE VISIT WITH PROVIDER: (Welcome to [employer], initial annual wellness or annual wellness exam)  Virtual Visit via Phone Note  I connected with Tanya Swanson on 06/17/22 by phone and verified that I am speaking with the correct person using two identifiers.  Location patient: home Location provider:work or home office Persons participating in the virtual visit: patient, provider  Concerns and/or follow up today: no concerns today   See HM section in Epic for other details of completed HM.    ROS: negative for report of fevers, unintentional weight loss, vision changes, vision loss, hearing loss or change, chest pain, sob, hemoptysis, melena, hematochezia, hematuria, falls, bleeding or bruising, thoughts of suicide or self harm, memory loss  Patient-completed extensive health risk assessment - reviewed and discussed with the patient: See Health Risk Assessment completed with patient prior to the visit either above or in recent phone note. This was reviewed in detailed with the patient today and appropriate recommendations, orders and referrals were placed as needed per Summary below  and patient  instructions.   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   Blood in stool    Chicken pox    Chronic kidney disease    UTI and kidneystones   Constipation    unknown urology procedure 2006   Hiatal hernia    Kidney stones    Pneumonia 1968   Urinary tract infection     Past Surgical History:  Procedure Laterality Date   CATARACT EXTRACTION W/PHACO Right 02/24/2015   Procedure: CATARACT EXTRACTION PHACO AND INTRAOCULAR LENS PLACEMENT RIGHT EYE CDE=8.01;  Surgeon: Gemma Payor, MD;  Location: [facility];  Service: Ophthalmology;  Laterality: Right;   CATARACT EXTRACTION W/PHACO Left 04/03/2015   Procedure: CATARACT EXTRACTION PHACO AND INTRAOCULAR LENS PLACEMENT (IOC);  Surgeon: Gemma Payor, MD;  Location: [facility];  Service: Ophthalmology;  Laterality: Left;  CDE:11.74   DILATION AND CURETTAGE OF UTERUS     LITHOTRIPSY     TONSILLECTOMY  1951    Social History   Socioeconomic History   Marital status: Married    Spouse name: Not on file   Number of children: Not on file   Years of education: Not on file   Highest education level: Not on file  Occupational History   Not on file  Tobacco Use   Smoking status: Never   Smokeless tobacco: Never  Substance and Sexual Activity   Alcohol use: No   Drug use: No   Sexual activity: Not on file  Other Topics Concern   Not on file  Social History Narrative   Not on file   Social Determinants of Health   Financial Resource Strain: Low Risk  (06/19/2021)   Overall Financial Resource Strain (CARDIA)    Difficulty of Paying Living Expenses: Not hard at all  Food Insecurity: No Food Insecurity (06/17/2022)   Hunger Vital Sign    Worried About Running Out of Food in the  Last Year: Never true    Ran Out of Food in the Last Year: Never true  Transportation Needs: No Transportation Needs (06/17/2022)   PRAPARE - Administrator, Civil Service (Medical): No    Lack of Transportation (Non-Medical): No  Physical Activity: Insufficiently Active (06/17/2022)   Exercise Vital Sign    Days of Exercise per Week: 7 days    Minutes of Exercise per Session: 10 min  Stress: No Stress Concern Present (06/17/2022)   [employer] of Occupational Health - Occupational Stress Questionnaire    Feeling of Stress : Not at all  Social Connections: Socially Integrated (06/17/2022)   Social Connection and Isolation Panel [NHANES]    Frequency of Communication with Friends and Family: More than three times a week    Frequency of Social Gatherings with Friends and Family: More than three times a week    Attends Religious Services: More than 4 times per year    Active Member of [employer] or Organizations: Yes    Attends Banker Meetings: More than 4 times per year    Marital Status: Married  Catering manager Violence: Not At Risk (06/19/2021)   Humiliation, Afraid, Rape, and Kick questionnaire    Fear of Current or Ex-Partner: No  Emotionally Abused: No    Physically Abused: No    Sexually Abused: No    Family History  Problem Relation Age of Onset   Breast cancer Maternal Aunt    Arthritis Mother    Hypertension Mother    Arthritis Father    Hypertension Father     Current Outpatient Medications on File Prior to Visit  Medication Sig Dispense Refill   Acetaminophen (TYLENOL PO) Take 500 mg by mouth as needed.     Biotin 5 MG TABS Take by mouth.     Cholecalciferol (VITAMIN D3) 50 MCG (2000 UT) capsule Take by mouth.     ciprofloxacin (CILOXAN) 0.3 % ophthalmic solution Place 1 drop into the left eye 2 (two) times daily.     Cyanocobalamin (VITAMIN B-12 PO) Take by mouth.     cycloSPORINE (RESTASIS) 0.05 % ophthalmic emulsion Place 1 drop into both  eyes 2 (two) times daily.     Famotidine (PEPCID PO) Take by mouth daily.     OVER THE COUNTER MEDICATION Taking [facility] feet and nerves. Take 1 capsule in a.m and 1 p.m     polyethylene glycol (MIRALAX / GLYCOLAX) packet Take 17 g by mouth daily as needed. For constipation.     Probiotic Product (PROBIOTIC DAILY PO) Take by mouth daily.     Calcium-Vitamin D-Vitamin K 500-100-40 MG-UNT-MCG CHEW Chew by mouth. (Patient not taking: Reported on 06/17/2022)     desonide (DESOWEN) 0.05 % cream Apply topically daily as needed. (Patient not taking: Reported on 06/17/2022) 30 g 0   No current facility-administered medications on file prior to visit.    Allergies  Allergen Reactions   Penicillins Other (See Comments)    Has patient had a PCN reaction causing immediate rash, facial/tongue/throat swelling, SOB or lightheadedness with hypotension: Yes Has patient had a PCN reaction causing severe rash involving mucus membranes or skin necrosis: No Has patient had a PCN reaction that required hospitalization No Has patient had a PCN reaction occurring within the last 10 years: No If all of the above answers are "NO", then may proceed with Cephalosporin use. Passed out Has patient had a PCN reaction causing immediate rash, facial/tongue/throat swelling, SOB or lightheadedness with hypotension: Yes Has patient had a PCN reaction causing severe rash involving mucus membranes or skin necrosis: No Has patient had a PCN reaction that required hospitalization No Has patient had a PCN reaction occurring within the last 10 years: No If all of the above answers are "NO", then may proceed with Cephalosporin use. Passed out fainted Has patient had a PCN reaction causing immediate rash, facial/tongue/throat swelling, SOB or lightheadedness with hypotension: Yes Has patient had a PCN reaction causing severe rash involving mucus membranes or skin necrosis: No Has patient had a PCN reaction that required  hospitalization No Has patient had a PCN reaction occurring within the last 10 years: No If all of the above answers are "NO", then may proceed with Cephalosporin use. Passed out fainted Has patient had a PCN reaction causing immediate rash, facial/tongue/throat swelling, SOB or lightheadedness with hypotension: Yes Has patient had a PCN reaction causing severe rash involving mucus membranes or skin necrosis: No Has patient had a PCN reaction that required hospitalization No Has patient had a PCN reaction occurring within the last 10 years: No If all of the above answers are "NO", then may proceed with Cephalosporin use. Passed out Has patient had a PCN reaction causing immediate rash, facial/tongue/throat swelling, SOB or  lightheadedness with hypotension: Yes Has patient had a PCN reaction causing severe rash involving mucus membranes or skin necrosis: No Has patient had a PCN reaction that required hospitalization No Has patient had a PCN reaction occurring within the last 10 years: No If all of the above answers are "NO", then may proceed with Cephalosporin use. Passed out fainted       Physical Exam Vitals:   Estimated body mass index is 26.47 kg/m as calculated from the following:   Height as of 03/31/22: 5\' 6"  (1.676 m).   Weight as of this encounter: 164 lb (74.4 kg).  EKG (optional): deferred due to virtual visit  GENERAL: alert, oriented, no acute distress detected, full vision exam deferred due to pandemic and/or virtual encounter  PSYCH/NEURO: pleasant and cooperative, no obvious depression or anxiety, speech and thought processing grossly intact, Cognitive function grossly intact  Flowsheet Row Office Visit from 12/03/2020 in [facility] [place]  PHQ-9 Total Score 1           06/17/2022    3:27 PM 11/10/2021    8:01 AM 07/06/2021    8:59 AM 06/19/2021    9:30 AM 02/02/2021   10:06 AM  Depression screen PHQ 2/9  Decreased Interest 0 0 0 0  0  Down, Depressed, Hopeless 0 0 0 0 1  PHQ - 2 Score 0 0 0 0 1       05/04/2021    9:52 PM 06/19/2021    9:34 AM 07/06/2021    8:59 AM 11/10/2021    8:01 AM 06/17/2022    3:15 PM  Fall Risk  Falls in the past year? 0 0 0 0 0  Was there an injury with Fall? 0 0 0 0 0  Fall Risk Category Calculator 0 0 0 0 0  Fall Risk Category (Retired) Low Low Low Low   (RETIRED) Patient Fall Risk Level Low fall risk Low fall risk Low fall risk Low fall risk   Patient at Risk for Falls Due to  No Fall Risks No Fall Risks Other (Comment) No Fall Risks  Fall risk Follow up Education provided  Falls evaluation completed Falls evaluation completed Falls evaluation completed     SUMMARY AND PLAN:  Encounter for Medicare annual wellness exam   Discussed applicable health maintenance/preventive health measures and advised and referred or ordered per patient preferences: -discussed vaccines due and recommendations per cdc  Health Maintenance  Topic Date Due   COVID-19 Vaccine (6 - 2023-24 season) 07/03/2022 (Originally 09/11/2021)   Zoster Vaccines- Shingrix (1 of 2) 01/16/2026 (Originally [birth year]/1989)   INFLUENZA VACCINE  08/12/2022   Medicare Annual Wellness (AWV)  06/17/2023   Pneumonia Vaccine 71+ Years old  Completed   DEXA SCAN  Completed   HPV VACCINES  Aged Out   DTaP/Tdap/Td  Discontinued   Education and counseling on the following was provided based on the above review of health and a plan/checklist for the patient, along with additional information discussed, was provided for the patient in the patient instructions :  -Provided counseling and plan for increased risk of falling if applicable per above screening. Reviewed and demonstrated safe balance exercises that can be done at home to improve balance and discussed exercise guidelines for adults with include balance exercises at least 3 days per week.  -Advised and counseled on a healthy lifestyle - including the importance of a healthy diet,  regular physical activity, social connections and stress management. -Reviewed patient's current diet.  Advised and counseled on a whole foods based healthy diet. A summary of a healthy diet was provided in the Patient Instructions.  -reviewed patient's current physical activity level and discussed exercise guidelines for adults. Discussed community resources and ideas for safe exercise at home to assist in meeting exercise guideline recommendations in a safe and healthy way. We discussed using some light hand weights and home exercises for when the gardening is out of season.  -Advise yearly dental visits at minimum and regular eye exams   Follow up: see patient instructions     Patient Instructions  I really enjoyed getting to talk with you today! I am available on Tuesdays and Thursdays for virtual visits if you have any questions or concerns, or if I can be of any further assistance.   CHECKLIST FROM ANNUAL WELLNESS VISIT:  -Follow up (please call to schedule if not scheduled after visit):   -yearly for annual wellness visit with primary care office  Here is a list of your preventive care/health maintenance measures and the plan for each if any are due:  PLAN For any measures below that may be due:   Health Maintenance  Topic Date Due   COVID-19 Vaccine (6 - 2023-24 season) 07/03/2022 (Originally 09/11/2021)   Zoster Vaccines- Shingrix (1 of 2) 01/16/2026 (Originally [birth year]/1989)   INFLUENZA VACCINE  08/12/2022   Medicare Annual Wellness (AWV)  06/17/2023   Pneumonia Vaccine 62+ Years old  Completed   DEXA SCAN  Completed   HPV VACCINES  Aged Out   DTaP/Tdap/Td  Discontinued    -See a dentist at least yearly  -Get your eyes checked and then per your eye specialist's recommendations  -Other issues addressed today:   -I have included below further information regarding a healthy whole foods based diet, physical activity guidelines for adults, stress management and opportunities  for social connections. I hope you find this information useful.   -----------------------------------------------------------------------------------------------------------------------------------------------------------------------------------------------------------------------------------------------------------  NUTRITION: -eat real food: lots of colorful vegetables (half the plate) and fruits -5-7 servings of vegetables and fruits per day (fresh or steamed is best), exp. 2 servings of vegetables with lunch and dinner and 2 servings of fruit per day. Berries and greens such as kale and collards are great choices.  -consume on a regular basis: whole grains (make sure first ingredient on label contains the word "whole"), fresh fruits, fish, nuts, seeds, healthy oils (such as olive oil, avocado oil, grape seed oil) -may eat small amounts of dairy and lean meat on occasion, but avoid processed meats such as ham, bacon, lunch meat, etc. -drink water -try to avoid fast food and pre-packaged foods, processed meat -most experts advise limiting sodium to < 2300mg  per day, should limit further is any chronic conditions such as high blood pressure, heart disease, diabetes, etc. The American Heart Association advised that < 1500mg  is is ideal -try to avoid foods that contain any ingredients with names you do not recognize  -try to avoid sugar/sweets (except for the natural sugar that occurs in fresh fruit) -try to avoid sweet drinks -try to avoid white rice, white bread, pasta (unless whole grain), white or yellow potatoes  EXERCISE GUIDELINES FOR ADULTS: -if you wish to increase your physical activity, do so gradually and with the approval of your doctor -STOP and seek medical care immediately if you have any chest pain, chest discomfort or trouble breathing when starting or increasing exercise  -move and stretch your body, legs, feet and arms when sitting for long periods -Physical  activity  guidelines for optimal health in adults: -least 150 minutes per week of aerobic exercise (can talk, but not sing) once approved by your doctor, 20-30 minutes of sustained activity or two 10 minute episodes of sustained activity every day.  -resistance training at least 2 days per week if approved by your doctor -balance exercises 3+ days per week:   Stand somewhere where you have something sturdy to hold onto if you lose balance.    1) lift up on toes, start with 5x per day and work up to 20x   2) stand and lift on leg straight out to the side so that foot is a few inches of the floor, start with 5x each side and work up to 20x each side   3) stand on one foot, start with 5 seconds each side and work up to 20 seconds on each side  If you need ideas or help with getting more active:  -Silver sneakers https://tools.silversneakers.com  -Walk with a Doc: http://www.duncan-williams.com/  -try to include resistance (weight lifting/strength building) and balance exercises twice per week: or the following link for ideas: http://castillo-powell.com/  BuyDucts.dk  STRESS MANAGEMENT: -can try meditating, or just sitting quietly with deep breathing while intentionally relaxing all parts of your body for 5 minutes daily -if you need further help with stress, anxiety or depression please follow up with your primary doctor or contact the wonderful folks at [facility]: [phone]  SOCIAL CONNECTIONS: -options in [place] if you wish to engage in more social and exercise related activities:  -Silver sneakers https://tools.silversneakers.com  -Walk with a Doc: http://www.duncan-williams.com/  -Check out the [facility] Active Adults 50+ section on the [place] [employer] (hiking clubs, book clubs, cards and games, chess, exercise classes, aquatic classes and much more) - see the website for  details: https://www.[place].gov/departments/parks-recreation/active-adults50  -YouTube has lots of exercise videos for different ages and abilities as well  -[facility] (a variety of indoor and outdoor inperson activities for adults). [phone]. [address]  -Virtual Online Classes (a variety of topics): see seniorplanet.org or call [phone]  -consider volunteering at a school, hospice center, church, senior center or elsewhere           Terressa Koyanagi, DO

## 2022-06-29 ENCOUNTER — Ambulatory Visit
Admission: RE | Admit: 2022-06-29 | Discharge: 2022-06-29 | Disposition: A | Discharge status: Home / Self Care | Payer: Medicare HMO | Source: Ambulatory Visit | Attending: Family Medicine | Admitting: Family Medicine

## 2022-06-29 DIAGNOSIS — Z1231 Encounter for screening mammogram for malignant neoplasm of breast: Secondary | ICD-10-CM

## 2022-08-23 ENCOUNTER — Ambulatory Visit: Payer: Medicare HMO | Admitting: Family Medicine

## 2022-08-23 ENCOUNTER — Encounter: Payer: Self-pay | Admitting: Family Medicine

## 2022-08-23 VITALS — BP 110/70 | HR 73 | Temp 98.2°F | Wt 166.4 lb

## 2022-08-23 DIAGNOSIS — R300 Dysuria: Secondary | ICD-10-CM | POA: Diagnosis not present

## 2022-08-23 DIAGNOSIS — N390 Urinary tract infection, site not specified: Secondary | ICD-10-CM | POA: Diagnosis not present

## 2022-08-23 LAB — POC URINALSYSI DIPSTICK (AUTOMATED)
Bilirubin, UA: NEGATIVE
Blood, UA: NEGATIVE
Glucose, UA: NEGATIVE
Ketones, UA: NEGATIVE
Nitrite, UA: NEGATIVE
Protein, UA: NEGATIVE
Spec Grav, UA: 1.015 (ref 1.010–1.025)
Urobilinogen, UA: 0.2 E.U./dL
pH, UA: 6 (ref 5.0–8.0)

## 2022-08-23 MED ORDER — CIPROFLOXACIN HCL 500 MG PO TABS
500.0000 mg | ORAL_TABLET | Freq: Two times a day (BID) | ORAL | 0 refills | Status: DC
Start: 2022-08-23 — End: 2022-11-17

## 2022-08-23 NOTE — Progress Notes (Signed)
   Subjective:    Patient ID: Tanya Swanson, female    DOB: [birth year], 82 y.o.   MRN: [id number]  HPI Here for continued UTI symptoms. On 08-14-22 she went to an urgent care with low back pain, urinary urgency, and burning. No fever. Her urine culture grew a pan-sensitive Acinetobacter baumannii. She was treated with 7 days of Keflex. She says the back pain is gone, but she still has the burning.    Review of Systems  Constitutional: Negative.   Respiratory: Negative.    Cardiovascular: Negative.   Genitourinary:  Positive for dysuria, frequency and urgency. Negative for flank pain and hematuria.       Objective:   Physical Exam Constitutional:      Appearance: Normal appearance. She is not ill-appearing.  Cardiovascular:     Rate and Rhythm: Normal rate and regular rhythm.     Pulses: Normal pulses.     Heart sounds: Normal heart sounds.  Pulmonary:     Effort: Pulmonary effort is normal.     Breath sounds: Normal breath sounds.  Abdominal:     Tenderness: There is no right CVA tenderness or left CVA tenderness.  Neurological:     Mental Status: She is alert.           Assessment & Plan:  Partially treated UTI. We will give her 7 days of Cipro. Recheck as needed. Gershon Crane, MD

## 2022-09-15 ENCOUNTER — Other Ambulatory Visit: Payer: Self-pay

## 2022-09-16 ENCOUNTER — Telehealth: Payer: Self-pay | Admitting: Family Medicine

## 2022-09-16 NOTE — Telephone Encounter (Signed)
error 

## 2022-09-20 ENCOUNTER — Telehealth: Payer: Self-pay

## 2022-09-20 NOTE — Telephone Encounter (Signed)
Pt ready for scheduling on or after 9/20.  Estimated out-of-pocket cost due at time of visit: $315  Primary Insurance:Aetna Prolia co-insurance: 20% (approximately $315)  Secondary Insurance: N/A  Deductible: $135 out of $2000.  Eligible for co-pay program: No  Prior Auth: Approved PA#: [id number] Valid: 03/29/22-03/29/23.  This summary of benefits is an estimation of the patient's out-of-pocket cost. Exact cost may vary based on individual plan coverage.

## 2022-10-21 ENCOUNTER — Ambulatory Visit (INDEPENDENT_AMBULATORY_CARE_PROVIDER_SITE_OTHER): Payer: Medicare HMO

## 2022-10-21 DIAGNOSIS — M816 Localized osteoporosis [Lequesne]: Secondary | ICD-10-CM | POA: Diagnosis not present

## 2022-10-21 MED ORDER — DENOSUMAB 60 MG/ML ~~LOC~~ SOSY
60.0000 mg | PREFILLED_SYRINGE | Freq: Once | SUBCUTANEOUS | Status: AC
Start: 2022-10-21 — End: 2022-10-21
  Administered 2022-10-21: 60 mg via SUBCUTANEOUS

## 2022-10-21 NOTE — Progress Notes (Signed)
Per orders of Dr. Salomon Fick, injection of Prolia given by [facility] on Left arm. Patient tolerated injection well.

## 2022-11-17 ENCOUNTER — Encounter: Payer: Self-pay | Admitting: Family Medicine

## 2022-11-17 ENCOUNTER — Ambulatory Visit: Payer: Medicare HMO | Admitting: Family Medicine

## 2022-11-17 VITALS — BP 120/70 | HR 82 | Temp 98.2°F | Ht 66.0 in | Wt 164.2 lb

## 2022-11-17 DIAGNOSIS — I700 Atherosclerosis of aorta: Secondary | ICD-10-CM | POA: Diagnosis not present

## 2022-11-17 DIAGNOSIS — E559 Vitamin D deficiency, unspecified: Secondary | ICD-10-CM

## 2022-11-17 DIAGNOSIS — E538 Deficiency of other specified B group vitamins: Secondary | ICD-10-CM | POA: Insufficient documentation

## 2022-11-17 DIAGNOSIS — M816 Localized osteoporosis [Lequesne]: Secondary | ICD-10-CM | POA: Diagnosis not present

## 2022-11-17 DIAGNOSIS — Z0000 Encounter for general adult medical examination without abnormal findings: Secondary | ICD-10-CM | POA: Diagnosis not present

## 2022-11-17 DIAGNOSIS — R202 Paresthesia of skin: Secondary | ICD-10-CM

## 2022-11-17 DIAGNOSIS — K219 Gastro-esophageal reflux disease without esophagitis: Secondary | ICD-10-CM

## 2022-11-17 LAB — COMPREHENSIVE METABOLIC PANEL
ALT: 16 U/L (ref 0–35)
AST: 22 U/L (ref 0–37)
Albumin: 4.2 g/dL (ref 3.5–5.2)
Alkaline Phosphatase: 66 U/L (ref 39–117)
BUN: 11 mg/dL (ref 6–23)
CO2: 28 meq/L (ref 19–32)
Calcium: 9.6 mg/dL (ref 8.4–10.5)
Chloride: 106 meq/L (ref 96–112)
Creatinine, Ser: 0.86 mg/dL (ref 0.40–1.20)
GFR: 62.33 mL/min (ref >60.00)
Glucose, Bld: 96 mg/dL (ref 70–99)
Potassium: 4.5 meq/L (ref 3.5–5.1)
Sodium: 141 meq/L (ref 135–145)
Total Bilirubin: 0.5 mg/dL (ref 0.2–1.2)
Total Protein: 7.3 g/dL (ref 6.0–8.3)

## 2022-11-17 LAB — LIPID PANEL
Cholesterol: 170 mg/dL (ref 0–200)
HDL: 67 mg/dL (ref >39.00)
LDL Cholesterol: 78 mg/dL (ref 0–99)
NonHDL: 103.36
Total CHOL/HDL Ratio: 3
Triglycerides: 128 mg/dL (ref 0.0–149.0)
VLDL: 25.6 mg/dL (ref 0.0–40.0)

## 2022-11-17 LAB — VITAMIN D 25 HYDROXY (VIT D DEFICIENCY, FRACTURES): VITD: 57.16 ng/mL (ref 30.00–100.00)

## 2022-11-17 LAB — VITAMIN B12: Vitamin B-12: 1078 pg/mL — ABNORMAL HIGH (ref 211–911)

## 2022-11-17 NOTE — Progress Notes (Signed)
HPI: Tanya Swanson is a 82 y.o. female with a PMHx significant for atherosclerosis of aorta, GERD, OA, chronic low back pain, and vitamin D deficiency, who is here today for her routine physical.  Last CPE: 11/10/2021  Exercise: She does some exercises at home and does yard work at home regularly.  Diet: She says she tries to eat healthy.  Sleep: She reports she is sleeping 7-8 hours per night. She occasionally has to get up to use the bathroom during the night. She occasionally naps for 20-30 minutes in a day.  Alcohol Use: She denies any alcohol usage. Smoking: never Vision: UTD on routine vision care. 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 It has helped with eyelid irritation and epiphora.  Dental: UTD on routine dental care.  Hearing: Wears hearing aids, she sees her audiologist regularly.   She still works 2 times per week.  Immunization History  Administered Date(s) Administered   Fluad Quad(high Dose 65+) 10/26/2021   Influenza Split 12/20/2003, 12/08/2004, 11/11/2005, 11/11/2006, 10/12/2007, 10/24/2008, 10/23/2009, 11/04/2010, 10/18/2011, 10/24/2019   Influenza, High Dose Seasonal PF 10/08/2014, 10/12/2017, 10/10/2018   Influenza,inj,Quad PF,6+ Mos 10/13/2015   Influenza-Unspecified 10/28/2020   Moderna Covid-19 Vaccine Bivalent Booster 42yrs & up 11/04/2020   Moderna Sars-Covid-2 Vaccination 01/22/2019, 02/19/2019, 11/08/2019, 06/25/2020   Pneumococcal Conjugate-13 07/22/2014, 04/09/2016   Pneumococcal Polysaccharide-23 01/12/2011, 10/18/2011   Td 05/08/1991   Tdap 01/14/2011   Zoster, Live 01/12/2007   Health Maintenance  Topic Date Due   INFLUENZA VACCINE  08/12/2022   COVID-19 Vaccine (6 - 2023-24 season) 09/12/2022   Zoster Vaccines- Shingrix (1 of 2) 01/16/2026 (Originally [birth year]1989)   Medicare Annual Wellness (AWV)  06/17/2023   Pneumonia Vaccine 66+ Years old  Completed    DEXA SCAN  Completed   HPV VACCINES  Aged Out   DTaP/Tdap/Td  Discontinued   Chronic medical problems:   Peripheral neuropathy:  Pretibial and feet burning sensation, stable. Problem is usually when she is in bed. She did not tolerate Gabapentin. She has been taking Healthy Feet and Nerves suggested to her by her podiatrist. She says it helps her shoulder a bit, but makes her urine turn more yellowish.  She has also been taking Nervive pills.   GERD:  She mentions she has been having some lower mid chest discomfort, very seldom, not exertional. She believes is similar to acid reflux she had in the past. Negative for abdominal pain,nausea,diaphoresis,or palpitations.  She is still taking Pepcid. Has not noted heartburn.   Osteoporosis: She is due for her Prolia injection today. She has tolerated well, no side effects. 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  She is still taking cholecalciferol 2000 units.  She is not taking calcium supplementation.  She denies any falls since her last visit.   Constipation She takes Miralax daily.  She says she has bowel movements most days, and has not seen any blood in her stool.   B12 deficiency:She takes 1000 units of B12 daily.  Lab Results  Component Value Date   VITAMINB12 872 07/06/2021   Aortic atherosclerosis:Did not tolerate statin medication. Lab Results  Component Value Date   CHOL 177 07/06/2021   HDL 62.10 07/06/2021   LDLCALC 86 07/06/2021   TRIG 146.0 07/06/2021   CHOLHDL 3 07/06/2021   Review of Systems  Constitutional:  Negative for activity change, appetite change and fever.  HENT:  Negative for mouth  sores, sore throat and trouble swallowing.   Eyes:  Negative for redness and visual disturbance.  Respiratory:  Negative for cough, shortness of breath and wheezing.   Cardiovascular:  Negative for palpitations and leg swelling.  Gastrointestinal:  Negative for abdominal pain, nausea and vomiting.        No changes in bowel habits.  Endocrine: Negative for cold intolerance, heat intolerance, polydipsia, polyphagia and polyuria.  Genitourinary:  Negative for decreased urine volume, dysuria and hematuria.  Musculoskeletal:  Positive for arthralgias. Negative for gait problem and myalgias.  Skin:  Negative for color change and rash.  Allergic/Immunologic: Positive for environmental allergies.  Neurological:  Negative for seizures, syncope, weakness and headaches.  Hematological:  Negative for adenopathy. Does not bruise/bleed easily.  Psychiatric/Behavioral:  Negative for confusion and hallucinations.   All other systems reviewed and are negative.  Current Outpatient Medications on File Prior to Visit  Medication Sig Dispense Refill   Acetaminophen (TYLENOL PO) Take 500 mg by mouth as needed.     Biotin 5 MG TABS Take by mouth.     Calcium-Vitamin D-Vitamin K 500-100-40 MG-UNT-MCG CHEW Chew by mouth.     Cholecalciferol (VITAMIN D3) 50 MCG (2000 UT) capsule Take by mouth.     ciprofloxacin (CILOXAN) 0.3 % ophthalmic solution Place 1 drop into the left eye 2 (two) times daily.     ciprofloxacin (CIPRO) 500 MG tablet Take 1 tablet (500 mg total) by mouth 2 (two) times daily. 14 tablet 0   Cyanocobalamin (VITAMIN B-12 PO) Take by mouth.     cycloSPORINE (RESTASIS) 0.05 % ophthalmic emulsion Place 1 drop into both eyes 2 (two) times daily.     desonide (DESOWEN) 0.05 % cream Apply topically daily as needed. (Patient not taking: Reported on 06/17/2022) 30 g 0   Famotidine (PEPCID PO) Take by mouth daily.     OVER THE COUNTER MEDICATION Taking [facility] feet and nerves. Take 1 capsule in a.m and 1 p.m     polyethylene glycol (MIRALAX / GLYCOLAX) packet Take 17 g by mouth daily as needed. For constipation.     Probiotic Product (PROBIOTIC DAILY PO) Take by mouth daily.     No current facility-administered medications on file prior to visit.    Past Medical History:  Diagnosis Date    Acid reflux    Allergy    Arthritis    in fingers   Blood in stool    Chicken pox    Chronic kidney disease    UTI and kidneystones   Constipation    unknown urology procedure 2006   Hiatal hernia    Kidney stones    Pneumonia 1968   Urinary tract infection     Past Surgical History:  Procedure Laterality Date   CATARACT EXTRACTION W/PHACO Right 02/24/2015   Procedure: CATARACT EXTRACTION PHACO AND INTRAOCULAR LENS PLACEMENT RIGHT EYE CDE=8.01;  Surgeon: Gemma Payor, MD;  Location: [facility];  Service: Ophthalmology;  Laterality: Right;   CATARACT EXTRACTION W/PHACO Left 04/03/2015   Procedure: CATARACT EXTRACTION PHACO AND INTRAOCULAR LENS PLACEMENT (IOC);  Surgeon: Gemma Payor, MD;  Location: [facility];  Service: Ophthalmology;  Laterality: Left;  CDE:11.74   DILATION AND CURETTAGE OF UTERUS     LITHOTRIPSY     TONSILLECTOMY  1951    Allergies  Allergen Reactions   Penicillins Other (See Comments)    Has patient had a PCN reaction causing immediate rash, facial/tongue/throat swelling, SOB or lightheadedness with hypotension: Yes Has patient had  a PCN reaction causing severe rash involving mucus membranes or skin necrosis: No Has patient had a PCN reaction that required hospitalization No Has patient had a PCN reaction occurring within the last 10 years: No If all of the above answers are "NO", then may proceed with Cephalosporin use. Passed out Has patient had a PCN reaction causing immediate rash, facial/tongue/throat swelling, SOB or lightheadedness with hypotension: Yes Has patient had a PCN reaction causing severe rash involving mucus membranes or skin necrosis: No Has patient had a PCN reaction that required hospitalization No Has patient had a PCN reaction occurring within the last 10 years: No If all of the above answers are "NO", then may proceed with Cephalosporin use. Passed out fainted Has patient had a PCN reaction causing immediate rash, facial/tongue/throat  swelling, SOB or lightheadedness with hypotension: Yes Has patient had a PCN reaction causing severe rash involving mucus membranes or skin necrosis: No Has patient had a PCN reaction that required hospitalization No Has patient had a PCN reaction occurring within the last 10 years: No If all of the above answers are "NO", then may proceed with Cephalosporin use. Passed out fainted Has patient had a PCN reaction causing immediate rash, facial/tongue/throat swelling, SOB or lightheadedness with hypotension: Yes Has patient had a PCN reaction causing severe rash involving mucus membranes or skin necrosis: No Has patient had a PCN reaction that required hospitalization No Has patient had a PCN reaction occurring within the last 10 years: No If all of the above answers are "NO", then may proceed with Cephalosporin use. Passed out Has patient had a PCN reaction causing immediate rash, facial/tongue/throat swelling, SOB or lightheadedness with hypotension: Yes Has patient had a PCN reaction causing severe rash involving mucus membranes or skin necrosis: No Has patient had a PCN reaction that required hospitalization No Has patient had a PCN reaction occurring within the last 10 years: No If all of the above answers are "NO", then may proceed with Cephalosporin use. Passed out fainted    Family History  Problem Relation Age of Onset   Breast cancer Maternal Aunt    Arthritis Mother    Hypertension Mother    Arthritis Father    Hypertension Father     Social History   Socioeconomic History   Marital status: Married    Spouse name: Not on file   Number of children: Not on file   Years of education: Not on file   Highest education level: Not on file  Occupational History   Not on file  Tobacco Use   Smoking status: Never   Smokeless tobacco: Never  Substance and Sexual Activity   Alcohol use: No   Drug use: No   Sexual activity: Not on file  Other Topics Concern   Not on file   Social History Narrative   Not on file   Social Determinants of Health   Financial Resource Strain: Low Risk  (06/19/2021)   Overall Financial Resource Strain (CARDIA)    Difficulty of Paying Living Expenses: Not hard at all  Food Insecurity: No Food Insecurity (06/17/2022)   Hunger Vital Sign    Worried About Running Out of Food in the Last Year: Never true    Ran Out of Food in the Last Year: Never true  Transportation Needs: No Transportation Needs (06/17/2022)   PRAPARE - Administrator, Civil Service (Medical): No    Lack of Transportation (Non-Medical): No  Physical Activity: Insufficiently Active (06/17/2022)  Exercise Vital Sign    Days of Exercise per Week: 7 days    Minutes of Exercise per Session: 10 min  Stress: No Stress Concern Present (06/17/2022)   [employer] of Occupational Health - Occupational Stress Questionnaire    Feeling of Stress : Not at all  Social Connections: Socially Integrated (06/17/2022)   Social Connection and Isolation Panel [NHANES]    Frequency of Communication with Friends and Family: More than three times a week    Frequency of Social Gatherings with Friends and Family: More than three times a week    Attends Religious Services: More than 4 times per year    Active Member of [employer] or Organizations: Yes    Attends Engineer, structural: More than 4 times per year    Marital Status: Married   Today's Vitals   11/17/22 1052  BP: 120/70  Pulse: 82  Temp: 98.2 F (36.8 C)  TempSrc: Oral  SpO2: 97%  Weight: 164 lb 4 oz (74.5 kg)  Height: 5\' 6"  (1.676 m)   Body mass index is 26.51 kg/m. Wt Readings from Last 3 Encounters:  11/17/22 164 lb 4 oz (74.5 kg)  08/23/22 166 lb 6.4 oz (75.5 kg)  06/17/22 164 lb (74.4 kg)   Physical Exam Vitals and nursing note reviewed.  Constitutional:      General: She is not in acute distress.    Appearance: She is well-developed.  HENT:     Head: Normocephalic and atraumatic.      Right Ear: External ear normal.     Left Ear: External ear normal.     Ears:     Comments: Hearing aids in place.    Mouth/Throat:     Mouth: Mucous membranes are moist.     Pharynx: Oropharynx is clear. Uvula midline.  Eyes:     Conjunctiva/sclera: Conjunctivae normal.     Pupils: Pupils are equal, round, and reactive to light.  Neck:     Thyroid: No thyroid mass.  Cardiovascular:     Rate and Rhythm: Normal rate and regular rhythm.     Pulses:          Dorsalis pedis pulses are 2+ on the right side and 2+ on the left side.     Heart sounds: No murmur heard. Pulmonary:     Effort: Pulmonary effort is normal. No respiratory distress.     Breath sounds: Normal breath sounds.  Abdominal:     Palpations: Abdomen is soft. There is no hepatomegaly or mass.     Tenderness: There is no abdominal tenderness.  Genitourinary:    Comments: No concerns. Musculoskeletal:     Right lower leg: No edema.     Left lower leg: No edema.     Comments: No major deformity or signs of synovitis appreciated.  Lymphadenopathy:     Cervical: No cervical adenopathy.  Skin:    General: Skin is warm.     Findings: No erythema or rash.  Neurological:     General: No focal deficit present.     Mental Status: She is alert and oriented to person, place, and time.     Cranial Nerves: No cranial nerve deficit.     Gait: Gait normal.     Deep Tendon Reflexes:     Reflex Scores:      Bicep reflexes are 2+ on the right side and 2+ on the left side.      Patellar reflexes are 2+ on the right  side and 2+ on the left side. Psychiatric:        Mood and Affect: Mood and affect normal.   ASSESSMENT AND PLAN:  Ms. Lamekia Nolden was here today for her annual physical examination.  Orders Placed This Encounter  Procedures   DG Bone Density   VITAMIN D 25 Hydroxy (Vit-D Deficiency, Fractures)   Lipid panel   Vitamin B12   Comprehensive metabolic panel   Lab Results  Component Value Date   NA 141  11/17/2022   CL 106 11/17/2022   K 4.5 11/17/2022   CO2 28 11/17/2022   BUN 11 11/17/2022   CREATININE 0.86 11/17/2022   GFR 62.33 11/17/2022   CALCIUM 9.6 11/17/2022   ALBUMIN 4.2 11/17/2022   GLUCOSE 96 11/17/2022   Lab Results  Component Value Date   ALT 16 11/17/2022   AST 22 11/17/2022   ALKPHOS 66 11/17/2022   BILITOT 0.5 11/17/2022   Lab Results  Component Value Date   CHOL 170 11/17/2022   HDL 67.00 11/17/2022   LDLCALC 78 11/17/2022   TRIG 128.0 11/17/2022   CHOLHDL 3 11/17/2022   Lab Results  Component Value Date   VITAMINB12 1,078 (H) 11/17/2022   Routine general medical examination at a health care facility Assessment & Plan: We discussed the importance of regular physical activity and healthy diet for prevention of chronic illness and/or complications. Preventive guidelines reviewed. Vaccination up to date. Next CPE in a year.   Atherosclerosis of aorta [facility]) Assessment & Plan: Seen on lumbar X ray in 11/2020. She did not tolerate statins well. Continue low-fat diet.  Orders: -     Lipid panel; Future  Vitamin D insufficiency Assessment & Plan: Continue Vit D 2000 U daily. Further recommendations according to 25 OH vit D result.  Orders: -     VITAMIN D 25 Hydroxy (Vit-D Deficiency, Fractures); Future -     Comprehensive metabolic panel; Future  B12 deficiency Assessment & Plan: A few of her supplements have B12, so recommend discontinuing B12 tabs and continue with rest of supplements. Further recommendations according to B12 result.  Orders: -     Vitamin B12; Future  Localized osteoporosis without current pathological fracture Assessment & Plan: Continue Prolia.q 6 months. Adequate Ca++ and vit D supplementation. Fall preventions and regular physical activity as tolerated. DEXA ordered.  Orders: -     DG Bone Density; Future -     Comprehensive metabolic panel; Future  Gastroesophageal reflux disease without  esophagitis Assessment & Plan: Continue Pepcid 20 mg daily prn and GERD precautions. Mid lower chest discomfort she is reporting today could be related to this problem. I do not think further work up is needed today. Instructed about warning signs.   Paresthesia of both lower extremities Assessment & Plan: She is on non pharmacologic treatment. Tried Gabapentin, did not tolerate well. Following with podiatrist.   Return in 1 year (on 11/17/2023) for CPE.   I, Rolla Etienne Wierda, acting as a scribe for Nancy Arvin [name], MD., have documented all relevant documentation on the behalf of Margurette Brener [name], MD, as directed by  Jakson Delpilar [name], MD while in the presence of Philmore Lepore [name], MD.   I, Suanne Marker, have reviewed all documentation for this visit. The documentation on 11/17/22 for the exam, diagnosis, procedures, and orders are all accurate and complete.  Jazae Gandolfi G. [name], MD  [facility]. [place] office.

## 2022-11-17 NOTE — Patient Instructions (Addendum)
A few things to remember from today's visit:  Routine general medical examination at a health care facility  Atherosclerosis of aorta (HCC) - Plan: Lipid panel  Vitamin D insufficiency - Plan: VITAMIN D 25 Hydroxy (Vit-D Deficiency, Fractures), Comprehensive metabolic panel  B12 deficiency - Plan: Vitamin B12  Localized osteoporosis without current pathological fracture - Plan: DG Bone Density, Comprehensive metabolic panel Do not use My Chart to request refills or for acute issues that need immediate attention. If you send a my chart message, it may take a few days to be addressed, specially if I am not in the office.  Please be sure medication list is accurate. If a new problem present, please set up appointment sooner than planned today.

## 2022-11-18 ENCOUNTER — Encounter: Payer: Self-pay | Admitting: Family Medicine

## 2022-11-18 NOTE — Assessment & Plan Note (Signed)
Seen on lumbar X ray in 11/2020. She did not tolerate statins well. Continue low-fat diet.

## 2022-11-18 NOTE — Assessment & Plan Note (Signed)
Continue Pepcid 20 mg daily prn and GERD precautions. Mid lower chest discomfort she is reporting today could be related to this problem. I do not think further work up is needed today. Instructed about warning signs.

## 2022-11-18 NOTE — Assessment & Plan Note (Signed)
She is on non pharmacologic treatment. Tried Gabapentin, did not tolerate well. Following with podiatrist.

## 2022-11-18 NOTE — Assessment & Plan Note (Signed)
Continue Vit D 2000 U daily. Further recommendations according to 25 OH vit D result.

## 2022-11-18 NOTE — Assessment & Plan Note (Signed)
We discussed the importance of regular physical activity and healthy diet for prevention of chronic illness and/or complications. Preventive guidelines reviewed. Vaccination up to date. Next CPE in a year. 

## 2022-11-18 NOTE — Assessment & Plan Note (Signed)
A few of her supplements have B12, so recommend discontinuing B12 tabs and continue with rest of supplements. Further recommendations according to B12 result.

## 2022-11-18 NOTE — Assessment & Plan Note (Signed)
Continue Prolia.q 6 months. Adequate Ca++ and vit D supplementation. Fall preventions and regular physical activity as tolerated. DEXA ordered.

## 2022-12-02 ENCOUNTER — Ambulatory Visit (HOSPITAL_COMMUNITY)
Admission: RE | Admit: 2022-12-02 | Discharge: 2022-12-02 | Disposition: A | Discharge status: Home / Self Care | Payer: Medicare HMO | Source: Ambulatory Visit | Attending: Family Medicine | Admitting: Family Medicine

## 2022-12-02 DIAGNOSIS — M816 Localized osteoporosis [Lequesne]: Secondary | ICD-10-CM | POA: Diagnosis present

## 2023-04-15 ENCOUNTER — Telehealth: Payer: Self-pay

## 2023-04-15 NOTE — Telephone Encounter (Addendum)
 Pt ready for scheduling on or after 4/10.  Estimated out-of-pocket cost due at time of visit: $315  Primary Insurance: [employer] Prolia co-insurance: 20% (approximately $315)  Prior Auth: Approved Valid: 04/15/23-04/14/24  This summary of benefits is an estimation of the patient's out-of-pocket cost. Exact cost may vary based on individual plan coverage.

## 2023-05-19 ENCOUNTER — Other Ambulatory Visit: Payer: Self-pay | Admitting: Family Medicine

## 2023-05-19 DIAGNOSIS — Z1231 Encounter for screening mammogram for malignant neoplasm of breast: Secondary | ICD-10-CM

## 2023-06-01 ENCOUNTER — Ambulatory Visit: Admitting: Family Medicine

## 2023-06-01 ENCOUNTER — Encounter: Payer: Self-pay | Admitting: Family Medicine

## 2023-06-01 VITALS — BP 110/60 | HR 88 | Resp 16 | Ht 66.0 in | Wt 161.2 lb

## 2023-06-01 DIAGNOSIS — M816 Localized osteoporosis [Lequesne]: Secondary | ICD-10-CM | POA: Diagnosis not present

## 2023-06-01 DIAGNOSIS — R202 Paresthesia of skin: Secondary | ICD-10-CM | POA: Diagnosis not present

## 2023-06-01 DIAGNOSIS — H6122 Impacted cerumen, left ear: Secondary | ICD-10-CM

## 2023-06-01 NOTE — Patient Instructions (Addendum)
 A few things to remember from today's visit:  Hearing loss due to cerumen impaction, left  Localized osteoporosis without current pathological fracture  Paresthesia of both lower extremities Continue appropriate foot/skin care. Resume prolia  in 2-3 weeks as far as you do not need more dental procedures.  If you need refills for medications you take chronically, please call your pharmacy. Do not use My Chart to request refills or for acute issues that need immediate attention. If you send a my chart message, it may take a few days to be addressed, specially if I am not in the office.  Please be sure medication list is accurate. If a new problem present, please set up appointment sooner than planned today.

## 2023-06-01 NOTE — Progress Notes (Signed)
 ACUTE VISIT Chief Complaint  Patient presents with   Ear Fullness    Left ear    HPI: Tanya Swanson is a 83 y.o. female with a PMHx significant for atherosclerosis of aorta, GERD, OA, chronic low back pain, osteoporosis, and vitamin D  deficiency, who is here today complaining of left ear fullness and to discuss osteoporosis.   Left ear fullness:  Patient complains of decreased hearing she noticed last week. She has had hearing aids, and had been told she would need her ears washed eventually by her audiologist.  She has not been taking anything OTC for her ears.  Denies ear pain or drainage.  Negative for recent respiratory tract infection or travel.  Patient also mentions she has been having some postnasal drainage in the mornings, she thinks it is from allergies and it is getting better.  She takes zyrtec 10 mg daily as needed, she has taken twice in the past month.  Negative for fever, chills, change in appetite.  Osteoporosis:  She took fosamax  and Actonel for several years and has also taken Prolia  more recently.  She has some recent dental procedures and had been advised by her dentist to consult with PCP about when she could resume Prolia .   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  Last DEXA 11/2022: DualFemur Neck Left 11/02/2022 83.7 Osteoporosis -2.6 0.676 g/cm2 4.0% - DualFemur Neck Left 09/26/2019 80.6 Osteoporosis -2.8 0.650 g/cm2 - -   DualFemur Total Mean 12/02/2022 83.7 Osteopenia -2.0 0.759 g/cm2 6.8% Yes DualFemur Total Mean 09/26/2019 80.6 Osteopenia -2.4 0.711 g/cm2 - -   Left Forearm Radius 33% 12/02/2022 83.7 Osteopenia -2.3 0.546 g/cm2 -3.7% - Left Forearm Radius 33% 09/26/2019 80.6 Osteopenia -2.1 0.566 g/cm2  -She also has some questions about treatments for peripheral neuropathy. Pretibial and feet burning sensation, which she has had for several years. She has tried  OTC medications. In the past she has taken gabapentin  but it caused unstable gait. She reports symptoms as stable.  Review of Systems  Constitutional:  Negative for activity change and appetite change.  HENT:  Positive for hearing loss and rhinorrhea. Negative for mouth sores and sore throat.   Respiratory:  Negative for cough, shortness of breath and wheezing.   Gastrointestinal:  Negative for abdominal pain and nausea.  Genitourinary:  Negative for decreased urine volume, dysuria and hematuria.  Skin:  Negative for rash.  Allergic/Immunologic: Positive for environmental allergies.  Neurological:  Negative for syncope and weakness.  Psychiatric/Behavioral:  Negative for confusion.   See other pertinent positives and negatives in HPI.  Current Outpatient Medications on File Prior to Visit  Medication Sig Dispense Refill   Acetaminophen  (TYLENOL  PO) Take 500 mg by mouth as needed.     Biotin 5 MG TABS Take by mouth.     Calcium -Vitamin D -Vitamin K 500-100-40 MG-UNT-MCG CHEW Chew by mouth.     Cholecalciferol (VITAMIN D3) 50 MCG (2000 UT) capsule Take by mouth.     ciprofloxacin  (CILOXAN ) 0.3 % ophthalmic solution Place 1 drop into the left eye 2 (two) times daily.     Cyanocobalamin  (VITAMIN B-12 PO) Take by mouth.     cycloSPORINE (RESTASIS) 0.05 % ophthalmic emulsion Place 1 drop into both eyes 2 (two) times daily.     Famotidine (PEPCID PO) Take by mouth daily.     OVER THE COUNTER MEDICATION Taking [facility] feet and nerves. Take  1 capsule in a.m and 1 p.m     polyethylene glycol (MIRALAX / GLYCOLAX) packet Take 17 g by mouth daily as needed. For constipation.     Probiotic Product (PROBIOTIC DAILY PO) Take by mouth daily.     No current facility-administered medications on file prior to visit.    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   Blood in stool    Chicken pox    Chronic kidney disease    UTI and  kidneystones   Constipation    unknown urology procedure 2006   Hiatal hernia    Kidney stones    Pneumonia 1968   Urinary tract infection    Allergies  Allergen Reactions   Penicillins Other (See Comments)    Has patient had a PCN reaction causing immediate rash, facial/tongue/throat swelling, SOB or lightheadedness with hypotension: Yes Has patient had a PCN reaction causing severe rash involving mucus membranes or skin necrosis: No Has patient had a PCN reaction that required hospitalization No Has patient had a PCN reaction occurring within the last 10 years: No If all of the above answers are "NO", then may proceed with Cephalosporin use. Passed out Has patient had a PCN reaction causing immediate rash, facial/tongue/throat swelling, SOB or lightheadedness with hypotension: Yes Has patient had a PCN reaction causing severe rash involving mucus membranes or skin necrosis: No Has patient had a PCN reaction that required hospitalization No Has patient had a PCN reaction occurring within the last 10 years: No If all of the above answers are "NO", then may proceed with Cephalosporin use. Passed out fainted Has patient had a PCN reaction causing immediate rash, facial/tongue/throat swelling, SOB or lightheadedness with hypotension: Yes Has patient had a PCN reaction causing severe rash involving mucus membranes or skin necrosis: No Has patient had a PCN reaction that required hospitalization No Has patient had a PCN reaction occurring within the last 10 years: No If all of the above answers are "NO", then may proceed with Cephalosporin use. Passed out fainted Has patient had a PCN reaction causing immediate rash, facial/tongue/throat swelling, SOB or lightheadedness with hypotension: Yes Has patient had a PCN reaction causing severe rash involving mucus membranes or skin necrosis: No Has patient had a PCN reaction that required hospitalization No Has patient had a PCN reaction  occurring within the last 10 years: No If all of the above answers are "NO", then may proceed with Cephalosporin use. Passed out Has patient had a PCN reaction causing immediate rash, facial/tongue/throat swelling, SOB or lightheadedness with hypotension: Yes Has patient had a PCN reaction causing severe rash involving mucus membranes or skin necrosis: No Has patient had a PCN reaction that required hospitalization No Has patient had a PCN reaction occurring within the last 10 years: No If all of the above answers are "NO", then may proceed with Cephalosporin use. Passed out fainted    Social History   Socioeconomic History   Marital status: Married    Spouse name: Not on file   Number of children: Not on file   Years of education: Not on file   Highest education level: Not on file  Occupational History   Not on file  Tobacco Use   Smoking status: Never   Smokeless tobacco: Never  Substance and Sexual Activity   Alcohol use: No   Drug use: No   Sexual activity: Not on file  Other Topics Concern   Not on file  Social History Narrative   Not on file   Social Drivers of Health   Financial Resource Strain: Low Risk  (06/19/2021)   Overall Financial Resource Strain (CARDIA)    Difficulty of Paying Living Expenses: Not hard at all  Food Insecurity: No Food Insecurity (06/17/2022)   Hunger Vital Sign    Worried About Running Out of Food in the Last Year: Never true    Ran Out of Food in the Last Year: Never true  Transportation Needs: No Transportation Needs (06/17/2022)   PRAPARE - Administrator, Civil Service (Medical): No    Lack of Transportation (Non-Medical): No  Physical Activity: Insufficiently Active (06/17/2022)   Exercise Vital Sign    Days of Exercise per Week: 7 days    Minutes of Exercise per Session: 10 min  Stress: No Stress Concern Present (06/17/2022)   [employer] of Occupational Health - Occupational Stress Questionnaire    Feeling of Stress :  Not at all  Social Connections: Socially Integrated (06/17/2022)   Social Connection and Isolation Panel [NHANES]    Frequency of Communication with Friends and Family: More than three times a week    Frequency of Social Gatherings with Friends and Family: More than three times a week    Attends Religious Services: More than 4 times per year    Active Member of [employer] or Organizations: Yes    Attends Banker Meetings: More than 4 times per year    Marital Status: Married   Vitals:   06/01/23 1054  BP: 110/60  Pulse: 88  Resp: 16  SpO2: 98%   Body mass index is 26.03 kg/m.  Physical Exam Vitals and nursing note reviewed.  Constitutional:      General: She is not in acute distress.    Appearance: Normal appearance. She is not ill-appearing.  HENT:     Head: Normocephalic and atraumatic.     Right Ear: There is no impacted cerumen. Tympanic membrane is not erythematous.     Left Ear: Decreased hearing noted. There is impacted cerumen.     Ears:     Comments: Impacted left-sided cerumen. Some non-impacted cerumen in the right side.  Hearing aids. Eyes:     Conjunctiva/sclera: Conjunctivae normal.  Cardiovascular:     Pulses:          Dorsalis pedis pulses are 2+ on the right side and 2+ on the left side.  Pulmonary:     Effort: Pulmonary effort is normal. No respiratory distress.  Musculoskeletal:     Right lower leg: No edema.     Left lower leg: No edema.  Lymphadenopathy:     Cervical: No cervical adenopathy.  Neurological:     General: No focal deficit present.     Mental Status: She is alert.     Gait: Gait normal.  Psychiatric:        Mood and Affect: Mood and affect normal.    ASSESSMENT AND PLAN:  Ms. Killian was seen today for left ear fullness and to discuss osteoporosis.   Hearing loss due to cerumen impaction, left After verbal consent, she underwent ear lavage, unsuccessful. Using a small curette, I removed all cerumen from left ear. She  tolerated procedure well. She reports that she can hear well even without her hearing aid. TM with no erythema. Follow-up as needed. Ear Cerumen Removal  Date/Time: 06/01/2023 11:38 AM  Performed by: [name], Torence Palmeri  Crissie Dome, MD Authorized by: [name], Silvie Obremski G, MD   Anesthesia: Local Anesthetic: none Ceruminolytics applied: Ceruminolytics applied prior to the procedure. Location details: left ear Patient tolerance: patient tolerated the procedure well with no immediate complications Procedure type: curette  Sedation: Patient sedated: no    Localized osteoporosis without current pathological fracture Assessment & Plan: We discussed DEXA results and treatment options. In the past you took Fosamax  and Actonel. She has tolerated Prolia  well. We explored the possibility of endocrinology consultation, we decided to hold on this. Recently she had a dental procedure, extraction 2-3 weeks ago. She would like to continue current treatment, so will re-start Prolia  in 2 to 3 weeks. Continue adequate calcium  and vitamin D  supplementation, fall precautions.  Regular physical activity that includes weightbearing exercises 3 times per week.  Paresthesia of both lower extremities Assessment & Plan: She reports problem as stable, has not noted new symptoms and it does not interfere with her daily activities or sleep. We discussed a couple treatment options. Tried Gabapentin , did not tolerate well. For now I recommend continuing with appropriate foot/skin care. Monitor for new symptoms. Following with podiatrist.  Return if symptoms worsen or fail to improve, for keep next appointment.  I, Fritz Jewel Wierda, acting as a scribe for Sofie Schendel [name], MD., have documented all relevant documentation on the behalf of Lexus Barletta [name], MD, as directed by  Alan Drummer [name], MD while in the presence of Sarrinah Gardin [name], MD.   I, Beatriz Settles [name], MD, have reviewed all documentation for this visit. The documentation on 06/01/23 for  the exam, diagnosis, procedures, and orders are all accurate and complete.  Eldo Umanzor G. [name], MD  [facility] [place] office.

## 2023-06-01 NOTE — Assessment & Plan Note (Signed)
 She reports problem as stable, has not noted new symptoms and it does not interfere with her daily activities or sleep. We discussed a couple treatment options. Tried Gabapentin , did not tolerate well. For now I recommend continuing with appropriate foot/skin care. Monitor for new symptoms. Following with podiatrist.

## 2023-06-01 NOTE — Assessment & Plan Note (Signed)
 We discussed treatment options. In the past you took Fosamax  and Actonel. She has tolerated Prolia  well. Recently she had a dental procedure, extraction 2-3 weeks ago. She can re-start Prolia  in 2 to 3 weeks. Continue adequate calcium  and vitamin D  supplementation, fall precautions.  Regular physical activity that includes weightbearing exercises 3 times per week.

## 2023-06-20 ENCOUNTER — Encounter: Payer: Self-pay | Admitting: Family Medicine

## 2023-06-20 ENCOUNTER — Ambulatory Visit (INDEPENDENT_AMBULATORY_CARE_PROVIDER_SITE_OTHER)

## 2023-06-20 ENCOUNTER — Ambulatory Visit: Admitting: Family Medicine

## 2023-06-20 VITALS — BP 120/70 | HR 88 | Resp 16 | Ht 66.0 in | Wt 160.0 lb

## 2023-06-20 DIAGNOSIS — W19XXXA Unspecified fall, initial encounter: Secondary | ICD-10-CM

## 2023-06-20 DIAGNOSIS — R300 Dysuria: Secondary | ICD-10-CM

## 2023-06-20 DIAGNOSIS — M533 Sacrococcygeal disorders, not elsewhere classified: Secondary | ICD-10-CM | POA: Diagnosis not present

## 2023-06-20 MED ORDER — MELOXICAM 7.5 MG PO TABS
7.5000 mg | ORAL_TABLET | Freq: Every day | ORAL | 0 refills | Status: AC
Start: 2023-06-20 — End: 2023-06-30

## 2023-06-20 MED ORDER — TRAMADOL HCL 50 MG PO TABS
50.0000 mg | ORAL_TABLET | Freq: Two times a day (BID) | ORAL | 0 refills | Status: AC | PRN
Start: 2023-06-20 — End: 2023-06-25

## 2023-06-20 NOTE — Progress Notes (Signed)
 ACUTE VISIT Chief Complaint  Patient presents with   Fall    Happened on Thursday, has been using tylenol  with no relief    HPI: Tanya Swanson is a 83 y.o. female with a PMHx significant for atherosclerosis of aorta, GERD, OA, chronic low back pain, osteoporosis, and vitamin D  deficiency, who is here today complaining of a fall as described above.  Patient had a fall on 6/5 while trying to take a large step onto her porch. She fell backwards onto some pine needles.  She was able to get up and go into her house, she is not sure how she did it, she thinks she "crawled" into the house. She was able to continue her routine daily activities with no significant limitations.  Fall The accident occurred 3 to 5 days ago. The fall occurred while walking. She fell from a height of 1 to 2 ft. There was no blood loss. The point of impact was the buttocks. The pain is present in the back and buttocks. The pain is severe. The symptoms are aggravated by ambulation, sitting and movement. Pertinent negatives include no abdominal pain, bowel incontinence, fever, headaches, loss of consciousness, nausea, visual change or vomiting. She has tried acetaminophen  for the symptoms. The treatment provided no relief.   Since then, she has been having a constant sacral and coccygeal pain, started gradual and has intensified,she rates as an 8/10. She is starting to have some radiation to the right perineal area. Pain is now interfering with some daily activities, has not been able to do housekeeping activities.  The pain is also interfering with her sleep. She is taking tylenol , but it has not helped. She has also been using ice and a heating pad.  Does not believe she hit her head. Recalls today's date without difficulty.  Pertinent negatives include saddle anesthesia, bowel/bladder dysfunction, LE's numbness or tingling, vaginal pruritus, or vaginal discharge/bleeding.  Also having some constant burning  sensation in her genital area worse with urination. She would like a UA done, concerned about UTI.  Negative for increased urinary frequency, gross hematuria, or urgency.  Review of Systems  Constitutional:  Positive for activity change. Negative for appetite change, chills and fever.  Respiratory:  Negative for cough and shortness of breath.   Cardiovascular:  Negative for chest pain, palpitations and leg swelling.  Gastrointestinal:  Negative for abdominal pain, bowel incontinence, nausea and vomiting.  Genitourinary:  Negative for genital sores.  Musculoskeletal:  Positive for back pain.  Skin:  Negative for rash.  Neurological:  Negative for loss of consciousness, syncope, weakness and headaches.  Psychiatric/Behavioral:  Negative for confusion and hallucinations.   See other pertinent positives and negatives in HPI.  Current Outpatient Medications on File Prior to Visit  Medication Sig Dispense Refill   Acetaminophen  (TYLENOL  PO) Take 500 mg by mouth as needed.     Biotin 5 MG TABS Take by mouth.     Calcium -Vitamin D -Vitamin K 500-100-40 MG-UNT-MCG CHEW Chew by mouth.     Cholecalciferol (VITAMIN D3) 50 MCG (2000 UT) capsule Take by mouth.     ciprofloxacin  (CILOXAN ) 0.3 % ophthalmic solution Place 1 drop into the left eye 2 (two) times daily.     Cyanocobalamin  (VITAMIN B-12 PO) Take by mouth.     cycloSPORINE (RESTASIS) 0.05 % ophthalmic emulsion Place 1 drop into both eyes 2 (two) times daily.     Famotidine (PEPCID PO) Take by mouth daily.     OVER THE COUNTER  MEDICATION Taking [facility] feet and nerves. Take 1 capsule in a.m and 1 p.m     polyethylene glycol (MIRALAX / GLYCOLAX) packet Take 17 g by mouth daily as needed. For constipation.     Probiotic Product (PROBIOTIC DAILY PO) Take by mouth daily.     No current facility-administered medications on file prior to visit.   Past Medical History:  Diagnosis Date   Acid reflux    Allergy    Arthritis    in  fingers   Blood in stool    Chicken pox    Chronic kidney disease    UTI and kidneystones   Constipation    unknown urology procedure 2006   Hiatal hernia    Kidney stones    Pneumonia 1968   Urinary tract infection    Allergies  Allergen Reactions   Penicillins Other (See Comments)    Has patient had a PCN reaction causing immediate rash, facial/tongue/throat swelling, SOB or lightheadedness with hypotension: Yes Has patient had a PCN reaction causing severe rash involving mucus membranes or skin necrosis: No Has patient had a PCN reaction that required hospitalization No Has patient had a PCN reaction occurring within the last 10 years: No If all of the above answers are "NO", then may proceed with Cephalosporin use. Passed out Has patient had a PCN reaction causing immediate rash, facial/tongue/throat swelling, SOB or lightheadedness with hypotension: Yes Has patient had a PCN reaction causing severe rash involving mucus membranes or skin necrosis: No Has patient had a PCN reaction that required hospitalization No Has patient had a PCN reaction occurring within the last 10 years: No If all of the above answers are "NO", then may proceed with Cephalosporin use. Passed out fainted Has patient had a PCN reaction causing immediate rash, facial/tongue/throat swelling, SOB or lightheadedness with hypotension: Yes Has patient had a PCN reaction causing severe rash involving mucus membranes or skin necrosis: No Has patient had a PCN reaction that required hospitalization No Has patient had a PCN reaction occurring within the last 10 years: No If all of the above answers are "NO", then may proceed with Cephalosporin use. Passed out fainted Has patient had a PCN reaction causing immediate rash, facial/tongue/throat swelling, SOB or lightheadedness with hypotension: Yes Has patient had a PCN reaction causing severe rash involving mucus membranes or skin necrosis: No Has patient had a PCN  reaction that required hospitalization No Has patient had a PCN reaction occurring within the last 10 years: No If all of the above answers are "NO", then may proceed with Cephalosporin use. Passed out Has patient had a PCN reaction causing immediate rash, facial/tongue/throat swelling, SOB or lightheadedness with hypotension: Yes Has patient had a PCN reaction causing severe rash involving mucus membranes or skin necrosis: No Has patient had a PCN reaction that required hospitalization No Has patient had a PCN reaction occurring within the last 10 years: No If all of the above answers are "NO", then may proceed with Cephalosporin use. Passed out fainted   Social History   Socioeconomic History   Marital status: Married    Spouse name: Not on file   Number of children: Not on file   Years of education: Not on file   Highest education level: Not on file  Occupational History   Not on file  Tobacco Use   Smoking status: Never   Smokeless tobacco: Never  Substance and Sexual Activity   Alcohol use: No   Drug use: No  Sexual activity: Not on file  Other Topics Concern   Not on file  Social History Narrative   Not on file   Social Drivers of Health   Financial Resource Strain: Low Risk  (06/19/2021)   Overall Financial Resource Strain (CARDIA)    Difficulty of Paying Living Expenses: Not hard at all  Food Insecurity: No Food Insecurity (06/17/2022)   Hunger Vital Sign    Worried About Running Out of Food in the Last Year: Never true    Ran Out of Food in the Last Year: Never true  Transportation Needs: No Transportation Needs (06/17/2022)   PRAPARE - Administrator, Civil Service (Medical): No    Lack of Transportation (Non-Medical): No  Physical Activity: Insufficiently Active (06/17/2022)   Exercise Vital Sign    Days of Exercise per Week: 7 days    Minutes of Exercise per Session: 10 min  Stress: No Stress Concern Present (06/17/2022)   [employer] of  Occupational Health - Occupational Stress Questionnaire    Feeling of Stress : Not at all  Social Connections: Socially Integrated (06/17/2022)   Social Connection and Isolation Panel [NHANES]    Frequency of Communication with Friends and Family: More than three times a week    Frequency of Social Gatherings with Friends and Family: More than three times a week    Attends Religious Services: More than 4 times per year    Active Member of [employer] or Organizations: Yes    Attends Banker Meetings: More than 4 times per year    Marital Status: Married   Vitals:   06/20/23 1112  BP: 120/70  Pulse: 88  Resp: 16  SpO2: 98%   Body mass index is 25.82 kg/m.  Physical Exam Vitals and nursing note reviewed. Exam conducted with a chaperone present.  Constitutional:      General: She is not in acute distress.    Appearance: She is well-developed.  HENT:     Head: Normocephalic and atraumatic.  Eyes:     Conjunctiva/sclera: Conjunctivae normal.  Cardiovascular:     Rate and Rhythm: Normal rate and regular rhythm.     Heart sounds: No murmur heard. Pulmonary:     Effort: Pulmonary effort is normal. No respiratory distress.     Breath sounds: Normal breath sounds.  Abdominal:     Palpations: Abdomen is soft. There is no mass.     Tenderness: There is no abdominal tenderness.  Genitourinary:    Exam position: Knee-chest position.     Pubic Area: No rash.      Labia:        Right: Tenderness present. No rash, lesion or injury.        Left: No rash, tenderness or lesion.     Musculoskeletal:     Lumbar back: Negative right straight leg raise test and negative left straight leg raise test.       Back:     Right lower leg: No edema.     Left lower leg: No edema.  Skin:    General: Skin is warm.     Findings: No erythema or rash.  Neurological:     General: No focal deficit present.     Mental Status: She is alert and oriented to person, place, and time.     Comments:  Antalgic gait, not assisted.  Psychiatric:        Mood and Affect: Affect normal. Mood is anxious.   ASSESSMENT AND  PLAN:  Tanya Swanson was seen today for a fall.   Coccygeal pain Very tender with palpation and with position changes on examination table. Meloxicam 7.5 to 15 mg daily for 7 to 10 days and tramadol 50 mg at bedtime for pain management. Seated on soft surfaces and/already used a donut pillow. Further recommendation will be given according to imaging results.  -     Meloxicam; Take 1-2 tablets (7.5-15 mg total) by mouth daily for 10 days.  Dispense: 20 tablet; Refill: 0 -     traMADol HCl; Take 1 tablet (50 mg total) by mouth every 12 (twelve) hours as needed for up to 5 days.  Dispense: 10 tablet; Refill: 0 -     DG Sacrum/Coccyx; Future  Fall, initial encounter Fall precautions discussed.  -     DG Sacrum/Coccyx; Future  Dysuria More like burning sensation around the perineal area after fall. Tenderness localized mainly on right major labia with palpation (not always) and with movement. ? Referred pain. No associated urinary symptoms. She would like UA done, further recommendation will be given according to results. Monitor for new symptoms.  -     Urinalysis w microscopic + reflex cultur  Return if symptoms worsen or fail to improve, for keep next appointment.  I, Fritz Jewel Wierda, acting as a scribe for Jonty Morrical [name], MD., have documented all relevant documentation on the behalf of Tanya Hardeman [name], MD, as directed by  Tanya Suire [name], MD while in the presence of Tanya Huge [name], MD.   I, Saifan Rayford [name], MD, have reviewed all documentation for this visit. The documentation on 06/20/23 for the exam, diagnosis, procedures, and orders are all accurate and complete.  Tanya Monreal G. [name], MD  [facility]. [place] office.

## 2023-06-20 NOTE — Patient Instructions (Addendum)
 A few things to remember from today's visit:  Coccygeal pain - Plan: meloxicam (MOBIC) 7.5 MG tablet, traMADol (ULTRAM) 50 MG tablet, DG Sacrum/Coccyx  Fall, initial encounter - Plan: DG Sacrum/Coccyx  Dysuria - Plan: Urinalysis with Culture Reflex Meloxicam during the day and Tramadol at night. Donut pillow and soft surfaces to sit on.  If you need refills for medications you take chronically, please call your pharmacy. Do not use My Chart to request refills or for acute issues that need immediate attention. If you send a my chart message, it may take a few days to be addressed, specially if I am not in the office.  Please be sure medication list is accurate. If a new problem present, please set up appointment sooner than planned today.

## 2023-06-21 ENCOUNTER — Ambulatory Visit: Payer: Self-pay

## 2023-06-21 ENCOUNTER — Ambulatory Visit: Payer: Self-pay | Admitting: Family Medicine

## 2023-06-21 NOTE — Telephone Encounter (Signed)
 FYI Only or Action Required?: Action required by provider  Patient was last seen in primary care on 06/20/2023 by [name], Tanya G, MD. Called Nurse Triage reporting Tailbone Pain, Dysuria, xray & urine test results, and pcp call back. Symptoms began several days ago. Interventions attempted: Rest, hydration, or home remedies. Symptoms are: unchanged.  Triage Disposition: See Physician Within 24 Hours  Patient/caregiver understands and will follow disposition?: No, wishes to speak with PCP  Copied from CRM [id number]. Topic: Clinical - Red Word Triage >> Jun 21, 2023  1:49 PM [name] wrote: [facility] that prompted transfer to Nurse Triage: pain in tailbone area Reason for Disposition  Side (flank) or lower back pain present  Answer Assessment - Initial Assessment Questions 1. SYMPTOM: "What's the main symptom you're concerned about?" (e.Swanson., frequency, incontinence)     dysuria 2. ONSET: "When did the  dysuria  start?"     This morning 3. PAIN: "Is there any pain?" If Yes, ask: "How bad is it?" (Scale: 1-10; mild, moderate, severe)     mild 4. CAUSE: "What do you think is causing the symptoms?"     unsure 5. OTHER SYMPTOMS: "Do you have any other symptoms?" (e.Swanson., blood in urine, fever, flank pain, pain with urination)     Back pain  Additional info: Evaluated in office on 06/20/23 post fall for tailbone pain. Tailbone pain is severe but manageable at this time. Received an email today that her results are abnormal, would like to know if doctor called her in an antibiotic. Patient requesting call back re: urinary and xray results.  Protocols used: Urinary Symptoms-A-AH

## 2023-06-22 ENCOUNTER — Ambulatory Visit: Admitting: *Deleted

## 2023-06-22 ENCOUNTER — Telehealth: Payer: Self-pay | Admitting: *Deleted

## 2023-06-22 DIAGNOSIS — M816 Localized osteoporosis [Lequesne]: Secondary | ICD-10-CM | POA: Diagnosis not present

## 2023-06-22 LAB — URINALYSIS W MICROSCOPIC + REFLEX CULTURE
Bacteria, UA: NONE SEEN /HPF
Bilirubin Urine: NEGATIVE
Glucose, UA: NEGATIVE
Hgb urine dipstick: NEGATIVE
Hyaline Cast: NONE SEEN /LPF
Ketones, ur: NEGATIVE
Nitrites, Initial: NEGATIVE
Protein, ur: NEGATIVE
RBC / HPF: NONE SEEN /HPF (ref 0–2)
Specific Gravity, Urine: 1.016 (ref 1.001–1.035)
pH: 6.5 (ref 5.0–8.0)

## 2023-06-22 LAB — URINE CULTURE
MICRO NUMBER:: 16558030
Result:: NO GROWTH
SPECIMEN QUALITY:: ADEQUATE

## 2023-06-22 LAB — CULTURE INDICATED

## 2023-06-22 MED ORDER — DENOSUMAB 60 MG/ML ~~LOC~~ SOSY
60.0000 mg | PREFILLED_SYRINGE | Freq: Once | SUBCUTANEOUS | Status: AC
Start: 2023-06-22 — End: 2023-06-22
  Administered 2023-06-22: 60 mg via SUBCUTANEOUS

## 2023-06-22 NOTE — Progress Notes (Signed)
Per orders of Dr. Jordan, injection of Prolia 60mg given by Obadiah Dennard A. °Patient tolerated injection well. ° °

## 2023-06-22 NOTE — Telephone Encounter (Signed)
 See result notes - PCP reached out to pt.

## 2023-06-22 NOTE — Telephone Encounter (Signed)
 I spoke with pt in the lobby. She is going to continue to drink fluids as she is wondering if the pain is being caused by a kidney stone. She did say the pain is not all the time. She will go to the ED if the pain gets worse.

## 2023-06-22 NOTE — Telephone Encounter (Signed)
 Patient came in as a nurse's visit today for a Prolia  injection.  Injection was given and the patient stated she is in pain, feels like something sharp and received a message about abnormal results.  Urine results were reviewed with the patient that was sent via Mychart message today at 1:02pm.  She asked if she could see Dr [name] now and was advised PCP is seeing patients at this time.  Offered an appt today with PCP at 4:30,  the patient declined and stated she will go to the ER.

## 2023-06-22 NOTE — Telephone Encounter (Signed)
 See other phone note

## 2023-06-30 ENCOUNTER — Ambulatory Visit
Admission: RE | Admit: 2023-06-30 | Discharge: 2023-06-30 | Disposition: A | Discharge status: Home / Self Care | Source: Ambulatory Visit | Attending: Family Medicine | Admitting: Family Medicine

## 2023-06-30 DIAGNOSIS — Z1231 Encounter for screening mammogram for malignant neoplasm of breast: Secondary | ICD-10-CM

## 2023-07-04 ENCOUNTER — Telehealth: Payer: Self-pay

## 2023-07-04 NOTE — Telephone Encounter (Signed)
 Copied from CRM [id number]. Topic: Clinical - Lab/Test Results >> Jul 04, 2023  4:01 PM Ernestene P wrote: Reason for CRM: Pt just wanted to check if Mammogram result came- would like to be notified once pcp reviewed it.

## 2023-07-04 NOTE — Telephone Encounter (Signed)
 I called and spoke with patient. She is aware the mammogram was normal. Pt mentioned that the pain in her groin has improved, but she is still having soreness with her tailbone. I did advise her that it could take a couple weeks to improve, and to keep doing what she has been. Pt verbalized understanding.

## 2023-07-18 ENCOUNTER — Ambulatory Visit (INDEPENDENT_AMBULATORY_CARE_PROVIDER_SITE_OTHER): Admitting: Family Medicine

## 2023-07-18 ENCOUNTER — Encounter: Payer: Self-pay | Admitting: Family Medicine

## 2023-07-18 VITALS — BP 120/80 | HR 88 | Resp 16 | Ht 66.0 in | Wt 160.4 lb

## 2023-07-18 DIAGNOSIS — L723 Sebaceous cyst: Secondary | ICD-10-CM

## 2023-07-18 DIAGNOSIS — R102 Pelvic and perineal pain: Secondary | ICD-10-CM

## 2023-07-18 MED ORDER — MELOXICAM 7.5 MG PO TABS
7.5000 mg | ORAL_TABLET | Freq: Every day | ORAL | 0 refills | Status: DC | PRN
Start: 2023-07-18 — End: 2023-09-05

## 2023-07-18 NOTE — Patient Instructions (Addendum)
 A few things to remember from today's visit:  Perineal pain in female - Plan: meloxicam  (MOBIC ) 7.5 MG tablet Pain could be related with fall, it is getting better, which seems reassuring. We can hold on imaging for now. Let me know in 2 weeks if pain is still getting better, let me know before if he gets worse. Take meloxicam  1 to 2 tablets daily as needed. Monitor for new symptoms.  If you need refills for medications you take chronically, please call your pharmacy. Do not use My Chart to request refills or for acute issues that need immediate attention. If you send a my chart message, it may take a few days to be addressed, specially if I am not in the office.  Please be sure medication list is accurate. If a new problem present, please set up appointment sooner than planned today.

## 2023-07-18 NOTE — Progress Notes (Signed)
 ACUTE VISIT Chief Complaint  Patient presents with   Tailbone Pain   HPI: Tanya Swanson is a 83 y.o. female with a PMHx significant for atherosclerosis of aorta, GERD, OA, chronic low back pain, osteoporosis, and vitamin D  deficiency, who is here today complaining of persistent coccygeal pain.  Initially seen for this on 6/09 after she'd fallen on 6/05. Says that lower back/coccygeal pain has greatly improved, though now has been 5/10 pain radiating towards her perineal/rectal area, right-sided. States that her husband inspected area and noted a perianal cyst. She has also had some pain with defecation and occasional with urination. Negative for fever, abnormal wt loss, abdominal pain, melena, gross hematuria, vaginal bleeding/discharge.  Was prescribed Meloxicam  7.5 - 15 mg daily and Tramadol  50 mg every 12 hours as needed. DG Sacrum/Coccyx: No acute or traumatic finding. Mild osteoarthritis of the right hip.  Reports occasional episodes of fecal incontinence but this is reportedly her baseline.   Review of Systems  Constitutional:  Positive for activity change. Negative for appetite change and fever.  Respiratory:  Negative for cough and shortness of breath.   Cardiovascular:  Negative for chest pain and leg swelling.  Gastrointestinal:  Negative for blood in stool, nausea and vomiting.  Genitourinary:  Negative for decreased urine volume, dysuria, frequency, genital sores and pelvic pain.  Musculoskeletal:  Positive for back pain (tailbone).  Skin:  Negative for rash.  Neurological:  Negative for weakness and numbness.  Psychiatric/Behavioral:  Negative for confusion and hallucinations. The patient is nervous/anxious.   See other pertinent positives and negatives in HPI.  Current Outpatient Medications on File Prior to Visit  Medication Sig Dispense Refill   Acetaminophen  (TYLENOL  PO) Take 500 mg by mouth as needed.     Biotin 5 MG TABS Take by mouth.      Calcium -Vitamin D -Vitamin K 500-100-40 MG-UNT-MCG CHEW Chew by mouth.     Cholecalciferol (VITAMIN D3) 50 MCG (2000 UT) capsule Take by mouth.     ciprofloxacin  (CILOXAN ) 0.3 % ophthalmic solution Place 1 drop into the left eye 2 (two) times daily.     Cyanocobalamin  (VITAMIN B-12 PO) Take by mouth.     cycloSPORINE (RESTASIS) 0.05 % ophthalmic emulsion Place 1 drop into both eyes 2 (two) times daily.     Famotidine (PEPCID PO) Take by mouth daily.     OVER THE COUNTER MEDICATION Taking [facility] feet and nerves. Take 1 capsule in a.m and 1 p.m     polyethylene glycol (MIRALAX / GLYCOLAX) packet Take 17 g by mouth daily as needed. For constipation.     Probiotic Product (PROBIOTIC DAILY PO) Take by mouth daily.     No current facility-administered medications on file prior to visit.    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   Blood in stool    Chicken pox    Chronic kidney disease    UTI and kidneystones   Constipation    unknown urology procedure 2006   Hiatal hernia    Kidney stones    Pneumonia 1968   Urinary tract infection    Allergies  Allergen Reactions   Penicillins Other (See Comments)    Has patient had a PCN reaction causing immediate rash, facial/tongue/throat swelling, SOB or lightheadedness with hypotension: Yes Has patient had a PCN reaction causing severe rash involving mucus membranes or skin necrosis: No Has patient had  a PCN reaction that required hospitalization No Has patient had a PCN reaction occurring within the last 10 years: No If all of the above answers are NO, then may proceed with Cephalosporin use. Passed out Has patient had a PCN reaction causing immediate rash, facial/tongue/throat swelling, SOB or lightheadedness with hypotension: Yes Has patient had a PCN reaction causing severe rash involving mucus membranes or skin necrosis: No Has patient had a PCN reaction that required  hospitalization No Has patient had a PCN reaction occurring within the last 10 years: No If all of the above answers are NO, then may proceed with Cephalosporin use. Passed out fainted Has patient had a PCN reaction causing immediate rash, facial/tongue/throat swelling, SOB or lightheadedness with hypotension: Yes Has patient had a PCN reaction causing severe rash involving mucus membranes or skin necrosis: No Has patient had a PCN reaction that required hospitalization No Has patient had a PCN reaction occurring within the last 10 years: No If all of the above answers are NO, then may proceed with Cephalosporin use. Passed out fainted Has patient had a PCN reaction causing immediate rash, facial/tongue/throat swelling, SOB or lightheadedness with hypotension: Yes Has patient had a PCN reaction causing severe rash involving mucus membranes or skin necrosis: No Has patient had a PCN reaction that required hospitalization No Has patient had a PCN reaction occurring within the last 10 years: No If all of the above answers are NO, then may proceed with Cephalosporin use. Passed out Has patient had a PCN reaction causing immediate rash, facial/tongue/throat swelling, SOB or lightheadedness with hypotension: Yes Has patient had a PCN reaction causing severe rash involving mucus membranes or skin necrosis: No Has patient had a PCN reaction that required hospitalization No Has patient had a PCN reaction occurring within the last 10 years: No If all of the above answers are NO, then may proceed with Cephalosporin use. Passed out fainted    Social History   Socioeconomic History   Marital status: Married    Spouse name: Not on file   Number of children: Not on file   Years of education: Not on file   Highest education level: Not on file  Occupational History   Not on file  Tobacco Use   Smoking status: Never   Smokeless tobacco: Never  Substance and Sexual Activity   Alcohol  use: No   Drug use: No   Sexual activity: Not on file  Other Topics Concern   Not on file  Social History Narrative   Not on file   Social Drivers of Health   Financial Resource Strain: Low Risk  (06/19/2021)   Overall Financial Resource Strain (CARDIA)    Difficulty of Paying Living Expenses: Not hard at all  Food Insecurity: No Food Insecurity (06/17/2022)   Hunger Vital Sign    Worried About Running Out of Food in the Last Year: Never true    Ran Out of Food in the Last Year: Never true  Transportation Needs: No Transportation Needs (06/17/2022)   PRAPARE - Administrator, Civil Service (Medical): No    Lack of Transportation (Non-Medical): No  Physical Activity: Insufficiently Active (06/17/2022)   Exercise Vital Sign    Days of Exercise per Week: 7 days    Minutes of Exercise per Session: 10 min  Stress: No Stress Concern Present (06/17/2022)   [employer] of Occupational Health - Occupational Stress Questionnaire    Feeling of Stress : Not at all  Social Connections:  Socially Integrated (06/17/2022)   Social Connection and Isolation Panel    Frequency of Communication with Friends and Family: More than three times a week    Frequency of Social Gatherings with Friends and Family: More than three times a week    Attends Religious Services: More than 4 times per year    Active Member of Clubs or Organizations: Yes    Attends Banker Meetings: More than 4 times per year    Marital Status: Married    Vitals:   07/18/23 1542  BP: 120/80  Pulse: 88  Resp: 16  SpO2: 96%   Body mass index is 25.89 kg/m.  Physical Exam Vitals and nursing note reviewed. Exam conducted with a chaperone present.  Constitutional:      General: She is not in acute distress.    Appearance: She is well-developed. She is not ill-appearing.  HENT:     Head: Normocephalic and atraumatic.  Eyes:     Conjunctiva/sclera: Conjunctivae normal.  Pulmonary:     Effort: Pulmonary  effort is normal. No respiratory distress.  Abdominal:     Palpations: Abdomen is soft.     Tenderness: There is no abdominal tenderness.  Genitourinary:    Pubic Area: No rash.      Labia:        Right: No rash or lesion.        Left: No rash or lesion.    Musculoskeletal:     Lumbar back: No tenderness.     Comments: Pain elicited with movement on exam table during examination. Mild pain with palpation of coccyx.  Skin:    General: Skin is warm.     Findings: No erythema or rash.  Neurological:     General: No focal deficit present.     Mental Status: She is alert and oriented to person, place, and time.     Deep Tendon Reflexes:     Reflex Scores:      Patellar reflexes are 2+ on the right side and 2+ on the left side.    Comments: Antalgic gait, not assisted.  Psychiatric:        Mood and Affect: Mood and affect normal.    ASSESSMENT AND PLAN: Ms.Sahory Dee Kotarski was seen here today for Persisting Coccygeal Pain.  Perineal pain in female -     Meloxicam ; Take 1-2 tablets (7.5-15 mg total) by mouth daily as needed for pain.  Dispense: 30 tablet; Refill: 0  Coccygeal and perineal pain that started 2 days after fall. Imaging done 06/20/23 negative for pelvic/coccygeal fractures. No abnormalities appreciated on inspection, pain with palpation of right ischium and right pubic arch. She reports that pain has improved. We discussed options, including repeating plain imaging and having MRI done. She agrees with holding on imaging for now, will let me know in 2 weeks if pain is still present, in which case we can consider arranging MRI. Meloxicam  7.5-15 mg daily for 10 days then as needed. Monitor for new symptoms.  Sebaceous cyst  Left major labia and perianal. We discussed Dx and prognosis. Asymptomatic. Continue monitoring.  I spent a total of 36 minutes in both face to face and non face to face activities for this visit on the date of this encounter. During this  time history was obtained and documented, examination was performed, prior imaging reviewed, and assessment/plan discussed.  Return if symptoms worsen or fail to improve, for keep next appointment.  I,Emily Lagle,acting as a Neurosurgeon for  Jah Alarid [name], MD.,have documented all relevant documentation on the behalf of Athanasios Heldman [name], MD,as directed by  Alinna Siple [name], MD while in the presence of Bianna Haran [name], MD.  I, Golda Zavalza [name], MD, have reviewed all documentation for this visit. The documentation on 07/18/23 for the exam, diagnosis, procedures, and orders are all accurate and complete.  Reshad Saab G. [name], MD  [facility]. [place] office.

## 2023-08-22 ENCOUNTER — Ambulatory Visit

## 2023-08-22 VITALS — BP 120/62 | HR 94 | Temp 98.6°F | Ht 66.0 in | Wt 160.3 lb

## 2023-08-22 DIAGNOSIS — Z0000 Encounter for general adult medical examination without abnormal findings: Secondary | ICD-10-CM

## 2023-08-22 NOTE — Patient Instructions (Addendum)
 Tanya Swanson , Thank you for taking time out of your busy schedule to complete your Annual Wellness Visit with me. I enjoyed our conversation and look forward to speaking with you again next year. I, as well as your care team,  appreciate your ongoing commitment to your health goals. Please review the following plan we discussed and let me know if I can assist you in the future. Your Game plan/ To Do List    Referrals: If you haven't heard from the office you've been referred to, please reach out to them at the phone provided.   Follow up Visits: We will see or speak with you next year for your Next Medicare AWV with our clinical staff 08/27/24 @ 3p Have you seen your provider in the last 6 months (3 months if uncontrolled diabetes)?   Clinician Recommendations:  Aim for 30 minutes of exercise or brisk walking, 6-8 glasses of water, and 5 servings of fruits and vegetables each day.       This is a list of the screenings recommended for you:  Health Maintenance  Topic Date Due   COVID-19 Vaccine (6 - 2024-25 season) 09/12/2022   Flu Shot  08/12/2023   Zoster (Shingles) Vaccine (1 of 2) 01/16/2026*   Medicare Annual Wellness Visit  08/21/2024   Pneumococcal Vaccine for age over 31  Completed   DEXA scan (bone density measurement)  Completed   Hepatitis B Vaccine  Aged Out   HPV Vaccine  Aged Out   Meningitis B Vaccine  Aged Out   DTaP/Tdap/Td vaccine  Discontinued  *Topic was postponed. The date shown is not the original due date.    Advanced directives: (Declined) Advance directive discussed with you today. Even though you declined this today, please call our office should you change your mind, and we can give you the proper paperwork for you to fill out. Advance Care Planning is important because it:  [x]  Makes sure you receive the medical care that is consistent with your values, goals, and preferences  [x]  It provides guidance to your family and loved ones and reduces their  decisional burden about whether or not they are making the right decisions based on your wishes.  Follow the link provided in your after visit summary or read over the paperwork we have mailed to you to help you started getting your Advance Directives in place. If you need assistance in completing these, please reach out to us  so that we can help you!  See attachments for Preventive Care and Fall Prevention Tips.

## 2023-08-22 NOTE — Progress Notes (Signed)
 Subjective:   Tanya Swanson is a 83 y.o. who presents for a Medicare Wellness preventive visit.  As a reminder, Annual Wellness Visits don't include a physical exam, and some assessments may be limited, especially if this visit is performed virtually. We may recommend an in-person follow-up visit with your provider if needed.  Visit Complete: In person    Persons Participating in Visit: Patient.  AWV Questionnaire: No: Patient Medicare AWV questionnaire was not completed prior to this visit.  Cardiac Risk Factors include: advanced age (>50men, >3 women)     Objective:    Today's Vitals   08/22/23 1452  BP: 120/62  Pulse: 94  Temp: 98.6 F (37 C)  TempSrc: Oral  SpO2: 94%  Weight: 160 lb 4.8 oz (72.7 kg)  Height: 5' 6 (1.676 m)   Body mass index is 25.87 kg/m.     08/22/2023    3:07 PM 06/19/2021    9:35 AM 06/18/2020    9:14 AM 04/03/2015    9:16 AM 02/24/2015    6:30 AM 03/14/2014   10:17 AM  Advanced Directives  Does Patient Have a Medical Advance Directive? No No Yes No  No  No   Type of Surveyor, minerals;Living will     Copy of Healthcare Power of Attorney in Chart?   No - copy requested     Would patient like information on creating a medical advance directive? No - Patient declined Yes (ED - Information included in AVS)  No - patient declined information  Yes - Educational materials given  Yes - Educational materials given      Data saved with a previous flowsheet row definition    Current Medications (verified) Outpatient Encounter Medications as of 08/22/2023  Medication Sig   Acetaminophen  (TYLENOL  PO) Take 500 mg by mouth as needed.   Biotin 5 MG TABS Take by mouth.   Calcium -Vitamin D -Vitamin K 500-100-40 MG-UNT-MCG CHEW Chew by mouth.   Cholecalciferol (VITAMIN D3) 50 MCG (2000 UT) capsule Take by mouth.   ciprofloxacin  (CILOXAN ) 0.3 % ophthalmic solution Place 1 drop into the left eye 2 (two) times daily. (Patient  not taking: Reported on 08/22/2023)   Cyanocobalamin  (VITAMIN B-12 PO) Take by mouth. (Patient not taking: Reported on 08/22/2023)   cycloSPORINE (RESTASIS) 0.05 % ophthalmic emulsion Place 1 drop into both eyes 2 (two) times daily.   Famotidine (PEPCID PO) Take by mouth daily.   meloxicam  (MOBIC ) 7.5 MG tablet Take 1-2 tablets (7.5-15 mg total) by mouth daily as needed for pain. (Patient not taking: Reported on 08/22/2023)   OVER THE COUNTER MEDICATION Taking [facility] feet and nerves. Take 1 capsule in a.m and 1 p.m   polyethylene glycol (MIRALAX / GLYCOLAX) packet Take 17 g by mouth daily as needed. For constipation.   Probiotic Product (PROBIOTIC DAILY PO) Take by mouth daily.   No facility-administered encounter medications on file as of 08/22/2023.    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   Blood in stool    Chicken pox    Chronic kidney disease    UTI and kidneystones   Constipation    unknown urology procedure 2006   Hiatal hernia    Kidney stones    Pneumonia 1968   Urinary tract infection    Past Surgical History:  Procedure Laterality  Date   CATARACT EXTRACTION W/PHACO Right 02/24/2015   Procedure: CATARACT EXTRACTION PHACO AND INTRAOCULAR LENS PLACEMENT RIGHT EYE CDE=8.01;  Surgeon: Cherene Mania, MD;  Location: [facility];  Service: Ophthalmology;  Laterality: Right;   CATARACT EXTRACTION W/PHACO Left 04/03/2015   Procedure: CATARACT EXTRACTION PHACO AND INTRAOCULAR LENS PLACEMENT (IOC);  Surgeon: Cherene Mania, MD;  Location: [facility];  Service: Ophthalmology;  Laterality: Left;  CDE:11.74   DILATION AND CURETTAGE OF UTERUS     LITHOTRIPSY     TONSILLECTOMY  1951   Family History  Problem Relation Age of Onset   Arthritis Mother    Hypertension Mother    Arthritis Father    Hypertension Father    Breast cancer Maternal Aunt 57 - 83   Social History    Socioeconomic History   Marital status: Married    Spouse name: Not on file   Number of children: Not on file   Years of education: Not on file   Highest education level: Not on file  Occupational History   Not on file  Tobacco Use   Smoking status: Never   Smokeless tobacco: Never  Substance and Sexual Activity   Alcohol use: No   Drug use: No   Sexual activity: Not on file  Other Topics Concern   Not on file  Social History Narrative   Not on file   Social Drivers of Health   Financial Resource Strain: Low Risk  (08/22/2023)   Overall Financial Resource Strain (CARDIA)    Difficulty of Paying Living Expenses: Not hard at all  Food Insecurity: No Food Insecurity (08/22/2023)   Hunger Vital Sign    Worried About Running Out of Food in the Last Year: Never true    Ran Out of Food in the Last Year: Never true  Transportation Needs: No Transportation Needs (08/22/2023)   PRAPARE - Administrator, Civil Service (Medical): No    Lack of Transportation (Non-Medical): No  Physical Activity: Inactive (08/22/2023)   Exercise Vital Sign    Days of Exercise per Week: 0 days    Minutes of Exercise per Session: 0 min  Stress: No Stress Concern Present (08/22/2023)   [employer] of Occupational Health - Occupational Stress Questionnaire    Feeling of Stress: Not at all  Social Connections: Socially Integrated (08/22/2023)   Social Connection and Isolation Panel    Frequency of Communication with Friends and Family: More than three times a week    Frequency of Social Gatherings with Friends and Family: More than three times a week    Attends Religious Services: More than 4 times per year    Active Member of [employer] or Organizations: Yes    Attends Engineer, structural: More than 4 times per year    Marital Status: Married    Tobacco Counseling Counseling given: Not Answered    Clinical Intake:  Pre-visit preparation completed: Yes  Pain : No/denies  pain     BMI - recorded: 25.87 Nutritional Status: BMI 25 -29 Overweight Nutritional Risks: None Diabetes: No  No results found for: HGBA1C   How often do you need to have someone help you when you read instructions, pamphlets, or other written materials from your doctor or pharmacy?: 1 - Never  Interpreter Needed?: No  Information entered by :: Rojelio Blush LPN   Activities of Daily Living     08/22/2023    3:05 PM  In your present state of health, do you have  any difficulty performing the following activities:  Hearing? 1  Comment Wears Hearing Aids  Vision? 0  Difficulty concentrating or making decisions? 0  Walking or climbing stairs? 0  Dressing or bathing? 0  Doing errands, shopping? 0  Preparing Food and eating ? N  Using the Toilet? N  In the past six months, have you accidently leaked urine? N  Do you have problems with loss of bowel control? N  Managing your Medications? N  Managing your Finances? N  Housekeeping or managing your Housekeeping? N    Patient Care Team: [name], Betty G, MD as PCP - General (Family Medicine)  I have updated your Care Teams any recent Medical Services you may have received from other providers in the past year.     Assessment:   This is a routine wellness examination for Tanya Swanson.  Hearing/Vision screen Hearing Screening - Comments:: Wears Hearing Aids Vision Screening - Comments:: Wears rx glasses - up to date with routine eye exams with  Dr Franchot   Goals Addressed               This Visit's Progress     Increase physical activity (pt-stated)        Get More active.       Depression Screen     08/22/2023    2:52 PM 11/17/2022   11:01 AM 06/17/2022    3:27 PM 11/10/2021    8:01 AM 07/06/2021    8:59 AM 06/19/2021    9:30 AM 02/02/2021   10:06 AM  PHQ 2/9 Scores  PHQ - 2 Score 0 0 0 0 0 0 1    Fall Risk     08/22/2023    3:06 PM 06/17/2022    3:15 PM 11/10/2021    8:01 AM 07/06/2021    8:59 AM 06/19/2021     9:34 AM  Fall Risk   Falls in the past year? 1 0 0 0 0  Number falls in past yr: 0 0 0 0 0  Injury with Fall? 0 0 0 0 0  Comment Followed by medical attention      Risk for fall due to : No Fall Risks No Fall Risks Other (Comment) No Fall Risks No Fall Risks  Follow up Falls evaluation completed Falls evaluation completed Falls evaluation completed  Falls evaluation completed       Data saved with a previous flowsheet row definition    MEDICARE RISK AT HOME:  Medicare Risk at Home Any stairs in or around the home?: Yes If so, are there any without handrails?: No Home free of loose throw rugs in walkways, pet beds, electrical cords, etc?: Yes Adequate lighting in your home to reduce risk of falls?: Yes Life alert?: No Use of a cane, walker or w/c?: No Grab bars in the bathroom?: No Shower chair or bench in shower?: No Elevated toilet seat or a handicapped toilet?: No  TIMED UP AND GO:  Was the test performed?  Yes  Length of time to ambulate 10 feet: 10 sec Gait steady and fast without use of assistive device  Cognitive Function: 6CIT completed        08/22/2023    3:07 PM 06/19/2021    9:36 AM  6CIT Screen  What Year? 0 points 0 points  What month? 0 points 0 points  What time? 0 points 0 points  Count back from 20 0 points 0 points  Months in reverse 0 points 0 points  Repeat  phrase 0 points 0 points  Total Score 0 points 0 points    Immunizations Immunization History  Administered Date(s) Administered   Fluad Quad(high Dose 65+) 10/26/2021   Influenza Split 12/20/2003, 12/08/2004, 11/11/2005, 11/11/2006, 10/12/2007, 10/24/2008, 10/23/2009, 11/04/2010, 10/18/2011, 10/24/2019   Influenza, High Dose Seasonal PF 10/08/2014, 10/12/2017, 10/10/2018   Influenza,inj,Quad PF,6+ Mos 10/13/2015   Influenza-Unspecified 10/28/2020   Moderna Covid-19 Vaccine Bivalent Booster 43yrs & up 11/04/2020   Moderna Sars-Covid-2 Vaccination 01/22/2019, 02/19/2019, 11/08/2019,  06/25/2020   Pneumococcal Conjugate-13 07/22/2014, 04/09/2016   Pneumococcal Polysaccharide-23 01/12/2011, 10/18/2011   Td 05/08/1991   Tdap 01/14/2011   Zoster, Live 01/12/2007    Screening Tests Health Maintenance  Topic Date Due   COVID-19 Vaccine (6 - 2024-25 season) 09/12/2022   INFLUENZA VACCINE  08/12/2023   Zoster Vaccines- Shingrix (1 of 2) 01/16/2026 (Originally [birth year]/13/1989)   Medicare Annual Wellness (AWV)  08/21/2024   Pneumococcal Vaccine: 50+ Years  Completed   DEXA SCAN  Completed   Hepatitis B Vaccines  Aged Out   HPV VACCINES  Aged Out   Meningococcal B Vaccine  Aged Out   DTaP/Tdap/Td  Discontinued    Health Maintenance  Health Maintenance Due  Topic Date Due   COVID-19 Vaccine (6 - 2024-25 season) 09/12/2022   INFLUENZA VACCINE  08/12/2023   Health Maintenance Items Addressed:   Additional Screening:  Vision Screening: Recommended annual ophthalmology exams for early detection of glaucoma and other disorders of the eye. Would you like a referral to an eye doctor? No    Dental Screening: Recommended annual dental exams for proper oral hygiene  Community Resource Referral / Chronic Care Management: CRR required this visit?  No   CCM required this visit?  No   Plan:    I have personally reviewed and noted the following in the patient's chart:   Medical and social history Use of alcohol, tobacco or illicit drugs  Current medications and supplements including opioid prescriptions. Patient is not currently taking opioid prescriptions. Functional ability and status Nutritional status Physical activity Advanced directives List of other physicians Hospitalizations, surgeries, and ER visits in previous 12 months Vitals Screenings to include cognitive, depression, and falls Referrals and appointments  In addition, I have reviewed and discussed with patient certain preventive protocols, quality metrics, and best practice recommendations. A written  personalized care plan for preventive services as well as general preventive health recommendations were provided to patient.   Rojelio LELON Blush, LPN   1/[id number]/[id number]   After Visit Summary: (In Person-Printed) AVS printed and given to the patient  Notes: Nothing significant to report at this time.

## 2023-09-05 ENCOUNTER — Ambulatory Visit: Admitting: Family Medicine

## 2023-09-05 ENCOUNTER — Encounter: Payer: Self-pay | Admitting: Family Medicine

## 2023-09-05 VITALS — BP 124/70 | HR 100 | Resp 16 | Ht 66.0 in | Wt 157.4 lb

## 2023-09-05 DIAGNOSIS — M67442 Ganglion, left hand: Secondary | ICD-10-CM | POA: Diagnosis not present

## 2023-09-05 DIAGNOSIS — K6289 Other specified diseases of anus and rectum: Secondary | ICD-10-CM | POA: Diagnosis not present

## 2023-09-05 DIAGNOSIS — H6121 Impacted cerumen, right ear: Secondary | ICD-10-CM | POA: Diagnosis not present

## 2023-09-05 DIAGNOSIS — G4700 Insomnia, unspecified: Secondary | ICD-10-CM | POA: Diagnosis not present

## 2023-09-05 NOTE — Patient Instructions (Addendum)
 A few things to remember from today's visit:  Ganglion of flexor tendon sheath of left thumb  Hearing loss of right ear due to cerumen impaction  Insomnia, unspecified type  Perianal pain You can try melatonin 2 hours before bedtime and will empty stomach. Continue adequate/good sleep hygiene.  Lesion on left thumb seems to be a ganglion, if persistent or worse we could arrange appointment with hand specialist.  If you need refills for medications you take chronically, please call your pharmacy. Do not use My Chart to request refills or for acute issues that need immediate attention. If you send a my chart message, it may take a few days to be addressed, specially if I am not in the office.  Please be sure medication list is accurate. If a new problem present, please set up appointment sooner than planned today.

## 2023-09-05 NOTE — Progress Notes (Signed)
 ACUTE VISIT Chief Complaint  Patient presents with   Cerumen Impaction   HPI: [name] is a 83 y.o. female, who is here today complaining of *** right ear fullness.  She has an upcoming appointment for adjustments with her hearing aid.  She states her audiologist recommended she get an ear lavage prior to her next appointment.  For the past 2-3 days she has been using oil ear drops to soften her ear wax.   She also complains of sleep disturbances, waking up nightly from 1:30 am to 2 am.  She typically gets up to use the bathroom and is unable to fall back asleep for 2-3 hours after.  At times she has to watch TV to help her fall back asleep.  Her friend has provided her with Melatonin gummies; pt inquired about the risks/benefits today.   Additionally, she complains of rectal discomfort.  Pt has a hx of perianal cyst. Her perianal pain has resolved.  Denies any rectal pain or bleeding.  Has not felt any masses or lumps.   Manages her back pain with Tylenol .    Review of Systems See other pertinent positives and negatives in HPI.  Current Outpatient Medications on File Prior to Visit  Medication Sig Dispense Refill   Acetaminophen  (TYLENOL  PO) Take 500 mg by mouth as needed.     Biotin 5 MG TABS Take by mouth.     Calcium -Vitamin D -Vitamin K 500-100-40 MG-UNT-MCG CHEW Chew by mouth.     Cholecalciferol (VITAMIN D3) 50 MCG (2000 UT) capsule Take by mouth.     cycloSPORINE (RESTASIS) 0.05 % ophthalmic emulsion Place 1 drop into both eyes 2 (two) times daily.     Famotidine (PEPCID PO) Take by mouth daily.     OVER THE COUNTER MEDICATION Taking [facility] feet and nerves. Take 1 capsule in a.m and 1 p.m     polyethylene glycol (MIRALAX / GLYCOLAX) packet Take 17 g by mouth daily as needed. For constipation.     Probiotic Product (PROBIOTIC DAILY PO) Take by mouth daily.     No current facility-administered medications on file prior to visit.     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   Blood in stool    Chicken pox    Chronic kidney disease    UTI and kidneystones   Constipation    unknown urology procedure 2006   Hiatal hernia    Kidney stones    Pneumonia 1968   Urinary tract infection    Allergies  Allergen Reactions   Penicillins Other (See Comments)    Has patient had a PCN reaction causing immediate rash, facial/tongue/throat swelling, SOB or lightheadedness with hypotension: Yes Has patient had a PCN reaction causing severe rash involving mucus membranes or skin necrosis: No Has patient had a PCN reaction that required hospitalization No Has patient had a PCN reaction occurring within the last 10 years: No If all of the above answers are NO, then may proceed with Cephalosporin use. Passed out Has patient had a PCN reaction causing immediate rash, facial/tongue/throat swelling, SOB or lightheadedness with hypotension: Yes Has patient had a PCN reaction causing severe rash involving mucus membranes or skin necrosis: No Has patient had a PCN reaction that required hospitalization No Has patient had a PCN reaction occurring within the last 10 years: No If all of the above answers are NO, then  may proceed with Cephalosporin use. Passed out fainted Has patient had a PCN reaction causing immediate rash, facial/tongue/throat swelling, SOB or lightheadedness with hypotension: Yes Has patient had a PCN reaction causing severe rash involving mucus membranes or skin necrosis: No Has patient had a PCN reaction that required hospitalization No Has patient had a PCN reaction occurring within the last 10 years: No If all of the above answers are NO, then may proceed with Cephalosporin use. Passed out fainted Has patient had a PCN reaction causing immediate rash, facial/tongue/throat swelling, SOB or lightheadedness with hypotension: Yes Has patient had a PCN  reaction causing severe rash involving mucus membranes or skin necrosis: No Has patient had a PCN reaction that required hospitalization No Has patient had a PCN reaction occurring within the last 10 years: No If all of the above answers are NO, then may proceed with Cephalosporin use. Passed out Has patient had a PCN reaction causing immediate rash, facial/tongue/throat swelling, SOB or lightheadedness with hypotension: Yes Has patient had a PCN reaction causing severe rash involving mucus membranes or skin necrosis: No Has patient had a PCN reaction that required hospitalization No Has patient had a PCN reaction occurring within the last 10 years: No If all of the above answers are NO, then may proceed with Cephalosporin use. Passed out fainted    Social History   Socioeconomic History   Marital status: Married    Spouse name: Not on file   Number of children: Not on file   Years of education: Not on file   Highest education level: Not on file  Occupational History   Not on file  Tobacco Use   Smoking status: Never   Smokeless tobacco: Never  Substance and Sexual Activity   Alcohol use: No   Drug use: No   Sexual activity: Not on file  Other Topics Concern   Not on file  Social History Narrative   Not on file   Social Drivers of Health   Financial Resource Strain: Low Risk  (08/22/2023)   Overall Financial Resource Strain (CARDIA)    Difficulty of Paying Living Expenses: Not hard at all  Food Insecurity: No Food Insecurity (08/22/2023)   Hunger Vital Sign    Worried About Running Out of Food in the Last Year: Never true    Ran Out of Food in the Last Year: Never true  Transportation Needs: No Transportation Needs (08/22/2023)   PRAPARE - Administrator, Civil Service (Medical): No    Lack of Transportation (Non-Medical): No  Physical Activity: Inactive (08/22/2023)   Exercise Vital Sign    Days of Exercise per Week: 0 days    Minutes of Exercise per  Session: 0 min  Stress: No Stress Concern Present (08/22/2023)   [employer] of Occupational Health - Occupational Stress Questionnaire    Feeling of Stress: Not at all  Social Connections: Socially Integrated (08/22/2023)   Social Connection and Isolation Panel    Frequency of Communication with Friends and Family: More than three times a week    Frequency of Social Gatherings with Friends and Family: More than three times a week    Attends Religious Services: More than 4 times per year    Active Member of [employer] or Organizations: Yes    Attends Banker Meetings: More than 4 times per year    Marital Status: Married    Vitals:   09/05/23 1125  BP: 124/70  Pulse: 100  SpO2: 97%  Body mass index is 25.4 kg/m.  Physical Exam Vitals and nursing note reviewed.  Constitutional:      General: She is not in acute distress.    Appearance: She is well-developed.  HENT:     Head: Normocephalic and atraumatic.     Right Ear: External ear normal.     Left Ear: Tympanic membrane, ear canal and external ear normal.     Ears:     Comments: ***     Mouth/Throat:     Mouth: Mucous membranes are moist.     Pharynx: Oropharynx is clear.  Eyes:     Conjunctiva/sclera: Conjunctivae normal.  Cardiovascular:     Rate and Rhythm: Normal rate and regular rhythm.     Pulses:          Dorsalis pedis pulses are 2+ on the right side and 2+ on the left side.     Heart sounds: No murmur heard. Pulmonary:     Effort: Pulmonary effort is normal. No respiratory distress.     Breath sounds: Normal breath sounds.  Abdominal:     Palpations: Abdomen is soft. There is no hepatomegaly or mass.     Tenderness: There is no abdominal tenderness.  Lymphadenopathy:     Cervical: No cervical adenopathy.  Skin:    General: Skin is warm.     Findings: No erythema or rash.  Neurological:     General: No focal deficit present.     Mental Status: She is alert and oriented to person, place,  and time.     Cranial Nerves: No cranial nerve deficit.     Gait: Gait normal.  Psychiatric:     Comments: Well groomed, good eye contact.     ASSESSMENT AND PLAN: Ms. Tanya Swanson was seen today for ear fullness.   There are no diagnoses linked to this encounter.  No follow-ups on file.   I, Corena Tilson, acting as a scribe for Io Dieujuste [name], MD., have documented all relevant documentation on the behalf of Tanya Sens [name], MD, as directed by   while in the presence of Tanya Shepherd [name], MD.  I, Tanya Ly [name], MD, have reviewed all documentation for this visit. The documentation on 09/05/23 for the exam, diagnosis, procedures, and orders are all accurate and complete.  Tanya Trunnell G. [name], MD  [facility]  Discharge Instructions   None

## 2023-09-08 ENCOUNTER — Encounter: Payer: Self-pay | Admitting: Family Medicine

## 2023-09-28 ENCOUNTER — Other Ambulatory Visit: Payer: Self-pay

## 2023-09-28 DIAGNOSIS — M816 Localized osteoporosis [Lequesne]: Secondary | ICD-10-CM

## 2023-09-28 MED ORDER — DENOSUMAB 60 MG/ML ~~LOC~~ SOSY: 60.0000 mg | PREFILLED_SYRINGE | SUBCUTANEOUS | Status: DC

## 2023-09-28 NOTE — Progress Notes (Signed)
 Pt on Bone Density report. Order placed for PA.

## 2023-11-21 ENCOUNTER — Ambulatory Visit: Admitting: Family Medicine

## 2023-11-21 ENCOUNTER — Encounter: Payer: Self-pay | Admitting: Family Medicine

## 2023-11-21 ENCOUNTER — Ambulatory Visit: Payer: Self-pay | Admitting: Family Medicine

## 2023-11-21 VITALS — BP 120/70 | HR 72 | Temp 97.9°F | Resp 16 | Ht 66.0 in | Wt 159.0 lb

## 2023-11-21 DIAGNOSIS — E559 Vitamin D deficiency, unspecified: Secondary | ICD-10-CM | POA: Diagnosis not present

## 2023-11-21 DIAGNOSIS — R202 Paresthesia of skin: Secondary | ICD-10-CM

## 2023-11-21 DIAGNOSIS — Z0000 Encounter for general adult medical examination without abnormal findings: Secondary | ICD-10-CM

## 2023-11-21 DIAGNOSIS — E538 Deficiency of other specified B group vitamins: Secondary | ICD-10-CM

## 2023-11-21 DIAGNOSIS — M816 Localized osteoporosis [Lequesne]: Secondary | ICD-10-CM

## 2023-11-21 DIAGNOSIS — I700 Atherosclerosis of aorta: Secondary | ICD-10-CM

## 2023-11-21 LAB — BASIC METABOLIC PANEL WITH GFR
BUN: 11 mg/dL (ref 6–23)
CO2: 27 meq/L (ref 19–32)
Calcium: 9.6 mg/dL (ref 8.4–10.5)
Chloride: 105 meq/L (ref 96–112)
Creatinine, Ser: 0.78 mg/dL (ref 0.40–1.20)
GFR: 69.58 mL/min (ref >60.00)
Glucose, Bld: 95 mg/dL (ref 70–99)
Potassium: 4.2 meq/L (ref 3.5–5.1)
Sodium: 141 meq/L (ref 135–145)

## 2023-11-21 LAB — LIPID PANEL
Cholesterol: 164 mg/dL (ref 0–200)
HDL: 66.5 mg/dL (ref >39.00)
LDL Cholesterol: 78 mg/dL (ref 0–99)
NonHDL: 97.25
Total CHOL/HDL Ratio: 2
Triglycerides: 95 mg/dL (ref 0.0–149.0)
VLDL: 19 mg/dL (ref 0.0–40.0)

## 2023-11-21 LAB — VITAMIN D 25 HYDROXY (VIT D DEFICIENCY, FRACTURES): VITD: 74.52 ng/mL (ref 30.00–100.00)

## 2023-11-21 LAB — VITAMIN B12: Vitamin B-12: 499 pg/mL (ref 211–911)

## 2023-11-21 NOTE — Assessment & Plan Note (Signed)
 Last DEXA in 11/2022. Currently on Prolia  every 6 months. Continue adequate calcium  and vitamin D  supplementation. Fall precaution discussed. Regular physical activity, low impact, that includes weightbearing exercises to 3 times per week.

## 2023-11-21 NOTE — Assessment & Plan Note (Signed)
 Continue current dose of vitamin D  supplementation, 2000 units daily. Further recommendation will be given according to 25 OH vitamin D  result.

## 2023-11-21 NOTE — Assessment & Plan Note (Signed)
 We discussed the importance of regular physical activity and healthy diet for prevention of chronic illness and/or complications. Preventive guidelines reviewed. Vaccination up today. Ca++ and vit D supplementation to continue. Next CPE in a year.

## 2023-11-21 NOTE — Patient Instructions (Addendum)
 A few things to remember from today's visit:  Routine general medical examination at a health care facility  B12 deficiency - Plan: Vitamin B12  Vitamin D  insufficiency - Plan: Basic metabolic panel with GFR, VITAMIN D  25 Hydroxy (Vit-D Deficiency, Fractures)  Localized osteoporosis without current pathological fracture - Plan: VITAMIN D  25 Hydroxy (Vit-D Deficiency, Fractures)  Paresthesia of both lower extremities - Plan: Basic metabolic panel with GFR  Atherosclerosis of aorta - Plan: Lipid panel  Do not use My Chart to request refills or for acute issues that need immediate attention. If you send a my chart message, it may take a few days to be addressed, specially if I am not in the office.  Please be sure medication list is accurate. If a new problem present, please set up appointment sooner than planned today.

## 2023-11-21 NOTE — Assessment & Plan Note (Signed)
 Currently she is not on B12 supplementation. Further recommendation will be given according to B12 result.

## 2023-11-21 NOTE — Assessment & Plan Note (Signed)
 Seen on imaging, lumbar x-ray in 11/2020. Currently she is not on statin medication, she reports some side effects in the past and discontinued. Continue low-fat diet. She would like her lipid panel checked today.

## 2023-11-21 NOTE — Progress Notes (Signed)
 Chief Complaint  Patient presents with   Annual Exam   Discussed the use of AI scribe software for clinical note transcription with the patient, who gave verbal consent to proceed.  History of Present Illness Tanya Swanson is an 83 year old female with PMHx significant for atherosclerosis of aorta, GERD, OA, chronic low back pain, osteoporosis, and vitamin D  deficiency who presents for an annual physical exam.  Last CPE 11/17/22. She would like blood work done today.  She engages in regular physical activity, including leg exercises and walking for at least ten minutes daily, and she goes up and down the stairs two or three times a day. She cooks her meals at home, consumes vegetables daily, and sleeps between six to eight hours on average.  She was seen on 09/05/23, when she was reporting rectal discomfort. She has seen her gastroenterologist. She was advised to continue taking Miralax, Pepcid, and a probiotic. She was also told to use a cream for rectal discomfort.  Osteoporosis: Currently on Prolia  q 6 months. Following a recent tooth extraction, she was advised by her dentist not to have her next Prolia  dose for about a month after the procedure. She also takes vitamin D3 with calcium .  She experiences occasional ankle swelling, no pain or erythema. LE/feet paresthesia/ peripheral neuropathy, she takes Actimmune once daily.  She previously took gabapentin  but discontinued it due to side effects. Recently followed with her podiatrist. She has not noted cyanosis,skin rash,or ulcers.  Immunization History  Administered Date(s) Administered   Fluad Quad(high Dose 65+) 10/26/2021   INFLUENZA, HIGH DOSE SEASONAL PF 10/08/2014, 10/12/2017, 10/10/2018   Influenza Split 12/20/2003, 12/08/2004, 11/11/2005, 11/11/2006, 10/12/2007, 10/24/2008, 10/23/2009, 11/04/2010, 10/18/2011, 10/24/2019   Influenza,inj,Quad PF,6+ Mos 10/13/2015   Influenza-Unspecified 10/28/2020   Moderna Covid-19  Vaccine Bivalent Booster 27yrs & up 11/04/2020   Moderna Sars-Covid-2 Vaccination 01/22/2019, 02/19/2019, 11/08/2019, 06/25/2020   Pneumococcal Conjugate-13 07/22/2014, 04/09/2016   Pneumococcal Polysaccharide-23 01/12/2011, 10/18/2011   Td 05/08/1991   Tdap 01/14/2011   Zoster, Live 01/12/2007   Health Maintenance  Topic Date Due   COVID-19 Vaccine (6 - 2025-26 season) 09/12/2023   Zoster Vaccines- Shingrix (1 of 2) 01/16/2026 (Originally [birth year]1989)   Medicare Annual Wellness (AWV)  08/21/2024   Pneumococcal Vaccine: 50+ Years  Completed   Influenza Vaccine  Completed   DEXA SCAN  Completed   Meningococcal B Vaccine  Aged Out   DTaP/Tdap/Td  Discontinued   Lab Results  Component Value Date   VD25OH 57.16 11/17/2022   Lab Results  Component Value Date   NA 141 11/17/2022   CL 106 11/17/2022   K 4.5 11/17/2022   CO2 28 11/17/2022   BUN 11 11/17/2022   CREATININE 0.86 11/17/2022   GFR 62.33 11/17/2022   CALCIUM  9.6 11/17/2022   ALBUMIN 4.2 11/17/2022   GLUCOSE 96 11/17/2022   Aortic atherosclerosis: She is not longer on statin. Requesting blood work today.  Lab Results  Component Value Date   CHOL 170 11/17/2022   HDL 67.00 11/17/2022   LDLCALC 78 11/17/2022   TRIG 128.0 11/17/2022   CHOLHDL 3 11/17/2022   Bq12 deficiency: She is not on B12 supplementation since 11/2022.  Lab Results  Component Value Date   VITAMINB12 1,078 (H) 11/17/2022   Review of Systems  Constitutional:  Negative for activity change, appetite change and fever.  HENT:  Positive for hearing loss. Negative for mouth sores, sore throat and trouble swallowing.   Eyes:  Negative for redness and  visual disturbance.  Respiratory:  Negative for cough, shortness of breath and wheezing.   Cardiovascular:  Negative for chest pain.  Gastrointestinal:  Negative for abdominal pain, nausea and vomiting.  Endocrine: Negative for cold intolerance, heat intolerance, polydipsia, polyphagia and polyuria.   Genitourinary:  Negative for decreased urine volume, dysuria and hematuria.  Musculoskeletal:  Positive for arthralgias and back pain. Negative for gait problem.  Skin:  Negative for color change and rash.  Allergic/Immunologic: Positive for environmental allergies.  Neurological:  Negative for syncope, weakness and headaches.  Hematological:  Negative for adenopathy. Does not bruise/bleed easily.  Psychiatric/Behavioral:  Negative for confusion and hallucinations.   All other systems reviewed and are negative.  Current Outpatient Medications on File Prior to Visit  Medication Sig Dispense Refill   Acetaminophen  (TYLENOL  PO) Take 500 mg by mouth as needed.     Biotin 5 MG TABS Take by mouth.     Calcium -Vitamin D -Vitamin K 500-100-40 MG-UNT-MCG CHEW Chew by mouth.     Cholecalciferol (VITAMIN D3) 50 MCG (2000 UT) capsule Take by mouth.     cycloSPORINE (RESTASIS) 0.05 % ophthalmic emulsion Place 1 drop into both eyes 2 (two) times daily.     Famotidine (PEPCID PO) Take by mouth daily.     OVER THE COUNTER MEDICATION Taking [facility] feet and nerves. Take 1 capsule in a.m and 1 p.m     polyethylene glycol (MIRALAX / GLYCOLAX) packet Take 17 g by mouth daily as needed. For constipation.     Probiotic Product (PROBIOTIC DAILY PO) Take by mouth daily.     Current Facility-Administered Medications on File Prior to Visit  Medication Dose Route Frequency Provider Last Rate Last Admin   [START ON 12/22/2023] denosumab  (PROLIA ) injection 60 mg  60 mg Subcutaneous Q6 months Elias Dennington G, MD        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   Blood in stool    Chicken pox    Chronic kidney disease    UTI and kidneystones   Constipation    unknown urology procedure 2006   Hiatal hernia    Kidney stones    Pneumonia 1968   Urinary tract infection    Past Surgical History:  Procedure Laterality Date   CATARACT EXTRACTION  W/PHACO Right 02/24/2015   Procedure: CATARACT EXTRACTION PHACO AND INTRAOCULAR LENS PLACEMENT RIGHT EYE CDE=8.01;  Surgeon: Cherene Mania, MD;  Location: [facility];  Service: Ophthalmology;  Laterality: Right;   CATARACT EXTRACTION W/PHACO Left 04/03/2015   Procedure: CATARACT EXTRACTION PHACO AND INTRAOCULAR LENS PLACEMENT (IOC);  Surgeon: Cherene Mania, MD;  Location: [facility];  Service: Ophthalmology;  Laterality: Left;  CDE:11.74   DILATION AND CURETTAGE OF UTERUS     LITHOTRIPSY     TONSILLECTOMY  1951    Allergies  Allergen Reactions   Penicillins Other (See Comments)    Has patient had a PCN reaction causing immediate rash, facial/tongue/throat swelling, SOB or lightheadedness with hypotension: Yes Has patient had a PCN reaction causing severe rash involving mucus membranes or skin necrosis: No Has patient had a PCN reaction that required hospitalization No Has patient had a PCN reaction occurring within the last 10 years: No If all of the above answers are NO, then may proceed with Cephalosporin use. Passed out Has patient had a PCN reaction causing immediate rash, facial/tongue/throat swelling, SOB or lightheadedness with hypotension: Yes Has  patient had a PCN reaction causing severe rash involving mucus membranes or skin necrosis: No Has patient had a PCN reaction that required hospitalization No Has patient had a PCN reaction occurring within the last 10 years: No If all of the above answers are NO, then may proceed with Cephalosporin use. Passed out fainted Has patient had a PCN reaction causing immediate rash, facial/tongue/throat swelling, SOB or lightheadedness with hypotension: Yes Has patient had a PCN reaction causing severe rash involving mucus membranes or skin necrosis: No Has patient had a PCN reaction that required hospitalization No Has patient had a PCN reaction occurring within the last 10 years: No If all of the above answers are NO, then may proceed with  Cephalosporin use. Passed out fainted Has patient had a PCN reaction causing immediate rash, facial/tongue/throat swelling, SOB or lightheadedness with hypotension: Yes Has patient had a PCN reaction causing severe rash involving mucus membranes or skin necrosis: No Has patient had a PCN reaction that required hospitalization No Has patient had a PCN reaction occurring within the last 10 years: No If all of the above answers are NO, then may proceed with Cephalosporin use. Passed out Has patient had a PCN reaction causing immediate rash, facial/tongue/throat swelling, SOB or lightheadedness with hypotension: Yes Has patient had a PCN reaction causing severe rash involving mucus membranes or skin necrosis: No Has patient had a PCN reaction that required hospitalization No Has patient had a PCN reaction occurring within the last 10 years: No If all of the above answers are NO, then may proceed with Cephalosporin use. Passed out fainted    Family History  Problem Relation Age of Onset   Arthritis Mother    Hypertension Mother    Arthritis Father    Hypertension Father    Breast cancer Maternal Aunt 56 - 63    Social History   Socioeconomic History   Marital status: Married    Spouse name: Not on file   Number of children: Not on file   Years of education: Not on file   Highest education level: Not on file  Occupational History   Not on file  Tobacco Use   Smoking status: Never   Smokeless tobacco: Never  Substance and Sexual Activity   Alcohol use: No   Drug use: No   Sexual activity: Not on file  Other Topics Concern   Not on file  Social History Narrative   Not on file   Social Drivers of Health   Financial Resource Strain: Low Risk  (08/22/2023)   Overall Financial Resource Strain (CARDIA)    Difficulty of Paying Living Expenses: Not hard at all  Food Insecurity: No Food Insecurity (08/22/2023)   Hunger Vital Sign    Worried About Running Out of Food in the  Last Year: Never true    Ran Out of Food in the Last Year: Never true  Transportation Needs: No Transportation Needs (08/22/2023)   PRAPARE - Administrator, Civil Service (Medical): No    Lack of Transportation (Non-Medical): No  Physical Activity: Inactive (08/22/2023)   Exercise Vital Sign    Days of Exercise per Week: 0 days    Minutes of Exercise per Session: 0 min  Stress: No Stress Concern Present (08/22/2023)   [employer] of Occupational Health - Occupational Stress Questionnaire    Feeling of Stress: Not at all  Social Connections: Socially Integrated (08/22/2023)   Social Connection and Isolation Panel    Frequency of Communication  with Friends and Family: More than three times a week    Frequency of Social Gatherings with Friends and Family: More than three times a week    Attends Religious Services: More than 4 times per year    Active Member of Clubs or Organizations: Yes    Attends Banker Meetings: More than 4 times per year    Marital Status: Married    Vitals:   11/21/23 0940  BP: 120/70  Pulse: 72  Resp: 16  Temp: 97.9 F (36.6 C)  SpO2: 96%   Body mass index is 25.66 kg/m.  Wt Readings from Last 3 Encounters:  11/21/23 159 lb (72.1 kg)  09/05/23 157 lb 6 oz (71.4 kg)  08/22/23 160 lb 4.8 oz (72.7 kg)   Physical Exam Vitals and nursing note reviewed.  Constitutional:      General: She is not in acute distress.    Appearance: She is well-developed.  HENT:     Head: Normocephalic and atraumatic.     Right Ear: External ear normal. Tympanic membrane is not erythematous.     Left Ear: Tympanic membrane, ear canal and external ear normal.     Ears:     Comments: Right ear canal with cerumen, not impacted, able to see TM. Hearing aids.    Mouth/Throat:     Mouth: Mucous membranes are moist.     Pharynx: Oropharynx is clear. Uvula midline.  Eyes:     Extraocular Movements: Extraocular movements intact.      Conjunctiva/sclera: Conjunctivae normal.     Pupils: Pupils are equal, round, and reactive to light.  Neck:     Thyroid : No thyromegaly.     Trachea: No tracheal deviation.  Cardiovascular:     Rate and Rhythm: Normal rate and regular rhythm.     Pulses:          Dorsalis pedis pulses are 2+ on the right side and 2+ on the left side.     Heart sounds: No murmur heard. Pulmonary:     Effort: Pulmonary effort is normal. No respiratory distress.     Breath sounds: Normal breath sounds.  Abdominal:     Palpations: Abdomen is soft. There is no hepatomegaly or mass.     Tenderness: There is no abdominal tenderness.  Genitourinary:    Comments: No concerns today. Musculoskeletal:     Right lower leg: No edema.     Left lower leg: No edema.     Comments: No major deformity or signs of synovitis appreciated.  Lymphadenopathy:     Cervical: No cervical adenopathy.  Skin:    General: Skin is warm.     Findings: No erythema or rash.  Neurological:     General: No focal deficit present.     Mental Status: She is alert and oriented to person, place, and time.     Cranial Nerves: No cranial nerve deficit.     Gait: Gait normal.     Deep Tendon Reflexes:     Reflex Scores:      Bicep reflexes are 2+ on the right side and 2+ on the left side.      Patellar reflexes are 2+ on the right side and 2+ on the left side. Psychiatric:        Mood and Affect: Mood and affect normal.    ASSESSMENT AND PLAN: Ms. Tanya Swanson was here today annual physical examination.  Orders Placed This Encounter  Procedures   Basic  metabolic panel with GFR   Lipid panel   Vitamin B12   VITAMIN D  25 Hydroxy (Vit-D Deficiency, Fractures)   Lab Results  Component Value Date   VITAMINB12 499 11/21/2023   Lab Results  Component Value Date   NA 141 11/21/2023   CL 105 11/21/2023   K 4.2 11/21/2023   CO2 27 11/21/2023   BUN 11 11/21/2023   CREATININE 0.78 11/21/2023   GFR 69.58 11/21/2023    CALCIUM  9.6 11/21/2023   ALBUMIN 4.2 11/17/2022   GLUCOSE 95 11/21/2023   Lab Results  Component Value Date   ALT 16 11/17/2022   AST 22 11/17/2022   ALKPHOS 66 11/17/2022   BILITOT 0.5 11/17/2022   Lab Results  Component Value Date   VD25OH 74.52 11/21/2023   Routine general medical examination at a health care facility Assessment & Plan: We discussed the importance of regular physical activity and healthy diet for prevention of chronic illness and/or complications. Preventive guidelines reviewed. Vaccination up today. Ca++ and vit D supplementation to continue. Next CPE in a year.   B12 deficiency Assessment & Plan: Currently she is not on B12 supplementation. Further recommendation will be given according to B12 result.  Orders: -     Vitamin B12; Future  Vitamin D  insufficiency Assessment & Plan: Continue current dose of vitamin D  supplementation, 2000 units daily. Further recommendation will be given according to 25 OH vitamin D  result.  Orders: -     Basic metabolic panel with GFR; Future -     VITAMIN D  25 Hydroxy (Vit-D Deficiency, Fractures); Future  Localized osteoporosis without current pathological fracture Assessment & Plan: Last DEXA in 11/2022. Currently on Prolia  every 6 months. Continue adequate calcium  and vitamin D  supplementation. Fall precaution discussed. Regular physical activity, low impact, that includes weightbearing exercises to 3 times per week.  Orders: -     VITAMIN D  25 Hydroxy (Vit-D Deficiency, Fractures); Future  Paresthesia of both lower extremities Assessment & Plan: This is a chronic problem, peripheral neuropathy. Did not tolerate gabapentin  well and symptoms are not significant, they do not interfere with daily activities. Continue adequate foot and skin care. She also follows with podiatrist.  Orders: -     Basic metabolic panel with GFR; Future  Atherosclerosis of aorta Assessment & Plan: Seen on imaging, lumbar  x-ray in 11/2020. Currently she is not on statin medication, she reports some side effects in the past and discontinued. Continue low-fat diet. She would like her lipid panel checked today.  Orders: -     Lipid panel; Future  Return in 1 year (on 11/20/2024) for CPE.  Perley Arthurs G. Azrael Maddix, MD  [facility]. [place] office.

## 2023-11-21 NOTE — Assessment & Plan Note (Signed)
 This is a chronic problem, peripheral neuropathy. Did not tolerate gabapentin  well and symptoms are not significant, they do not interfere with daily activities. Continue adequate foot and skin care. She also follows with podiatrist.

## 2023-11-22 ENCOUNTER — Telehealth: Payer: Self-pay

## 2023-11-22 NOTE — Telephone Encounter (Signed)
 Prolia  VOB initiated via [email or web address]  Next Prolia  inj DUE: 12/22/23

## 2023-11-23 ENCOUNTER — Other Ambulatory Visit (HOSPITAL_COMMUNITY): Payer: Self-pay

## 2023-11-23 NOTE — Telephone Encounter (Signed)
 Tanya Swanson

## 2023-11-23 NOTE — Telephone Encounter (Signed)
 MEDICAL PA APPROVED -     PHARMACY COPAY: $50

## 2023-11-23 NOTE — Telephone Encounter (Signed)
 Pt ready for scheduling for PROLIA  on or after : 12/22/23  Option# 1: Buy/Bill (Office supplied medication)  Out-of-pocket cost due at time of clinic visit: $352  Number of injection/visits approved: 2  Primary: [employer]-MEDICARE Prolia  co-insurance: 20% Admin fee co-insurance: $20  Secondary: --- Prolia  co-insurance:  Admin fee co-insurance:   Medical Benefit Details: Date Benefits were checked: 11/22/23 Deductible: NO/ Coinsurance: 20%/ Admin Fee: $20  Prior Auth: APPROVED PA# [id number] Expiration Date: 4/4/254/4/26  # of doses approved: 2 ----------------------------------------------------------------------- Option# 2- Med Obtained from pharmacy:  Pharmacy benefit: Copay $50 (Paid to pharmacy) Admin Fee: $20 (Pay at clinic)  Prior Auth: N/A PA# Expiration Date:   # of doses approved:   If patient wants fill through the pharmacy benefit please send prescription to: [facility], and include estimated need by date in rx notes. Pharmacy will ship medication directly to the office.  Patient NOT eligible for Prolia  Copay Card. Copay Card can make patient's cost as little as $25. Link to apply: [email or web address]://[email or web address]  ** This summary of benefits is an estimation of the patient's out-of-pocket cost. Exact cost may very based on individual plan coverage.

## 2023-12-06 ENCOUNTER — Telehealth: Payer: Self-pay | Admitting: *Deleted

## 2023-12-06 NOTE — Telephone Encounter (Signed)
 Copied from CRM #[id number]. Topic: Appointments - Scheduling Inquiry for Clinic >> Dec 06, 2023  3:48 PM Drema MATSU wrote: Reason for CRM: Kimberley Leyden stated that pt was suppose to be a prolia  shot and was told that the amount would be $300-350. Leyden is needing another pre authorization request to cover the cost. She stated they have one for 04/15/23 and it is good until 04/14/2024 but only one quantity was requested. That quantity was used in June 11th. They are needing one to be covered for December. Please call patient.

## 2023-12-07 NOTE — Telephone Encounter (Signed)
 New PA in patient chart 11/22/2023 telephone encounter from pharmacy.

## 2024-02-01 ENCOUNTER — Ambulatory Visit: Admitting: Family Medicine

## 2024-02-01 ENCOUNTER — Encounter: Payer: Self-pay | Admitting: Family Medicine

## 2024-02-01 VITALS — BP 118/82 | HR 85 | Temp 99.5°F | Resp 16 | Ht 66.0 in | Wt 158.4 lb

## 2024-02-01 DIAGNOSIS — J069 Acute upper respiratory infection, unspecified: Secondary | ICD-10-CM

## 2024-02-01 DIAGNOSIS — R350 Frequency of micturition: Secondary | ICD-10-CM

## 2024-02-01 DIAGNOSIS — G629 Polyneuropathy, unspecified: Secondary | ICD-10-CM | POA: Diagnosis not present

## 2024-02-01 LAB — POCT INFLUENZA A/B
Influenza A, POC: NEGATIVE
Influenza B, POC: NEGATIVE

## 2024-02-01 LAB — POC COVID19 BINAXNOW: SARS Coronavirus 2 Ag: NEGATIVE

## 2024-02-01 MED ORDER — PREGABALIN 25 MG PO CAPS: 25.0000 mg | ORAL_CAPSULE | Freq: Every day | ORAL | 0 refills | Status: AC

## 2024-02-01 NOTE — Patient Instructions (Addendum)
 A few things to remember from today's visit:  Peripheral polyneuropathy - Plan: pregabalin  (LYRICA ) 25 MG capsule  URI, acute - Plan: POCT Influenza A/B, POC COVID-19 BinaxNow  Urinary frequency - Plan: Urinalysis with Culture Reflex  Continue Flonase at bedtime for 10-14 days and saline nasal irrigations as needed. Monitor for fever. Tylenol  is needed. Lyrica  25 mg at bedtime for leg pain. I will see you back in 4 weeks.  If you need refills for medications you take chronically, please call your pharmacy. Do not use My Chart to request refills or for acute issues that need immediate attention. If you send a my chart message, it may take a few days to be addressed, specially if I am not in the office.  Please be sure medication list is accurate. If a new problem present, please set up appointment sooner than planned today.

## 2024-02-01 NOTE — Progress Notes (Signed)
 "  ACUTE VISIT Chief Complaint  Patient presents with   Leg Pain    X2 Lower legs pain all the way down to her feet - patient reports tingling    Sinus Problem    Runny nose , feeling terrible , congestion x 1-2 days ago   History of Present Illness Tanya Swanson is an 84 year old female with PMHx significant for aortic atherosclerosis, GERD, osteoarthritis, chronic back pain, osteoporosis, and vitamin D  deficiency, who is here today with above concerns.  She experiences severe leg pain that began on Monday night, described as burning, tingling, and hurting from the middle of both legs downwards. She took a leftover pain pill, meloxicam , at midnight due to the intensity of the pain. She has a history of leg pain for years, previously managed with gabapentin , which she discontinued due to unsteadiness. The pain primarily occurs at night, and she takes two Tylenol  every night for her back pain, which remains constant and does not radiate to her legs. Negative for saddle anesthesia or bowel/bladder dysfunction. She has not had recent falls.  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 Grade 1 anterolisthesis L4-5. Discussed the use of AI scribe software for clinical note transcription with the patient, who gave verbal consent to proceed.  -She has been experiencing sinus issues, including runny nose, nasal congestion, and sore throat when postnasal drainage occurs. She reports occasional headaches and body aches.  No fever or chills.  She took Allegra for three to four days about a month ago, which improved her symptoms but did not completely resolve them. She uses saline nasal irrigation and possibly Flonase at home.  She experienced diarrhea twice yesterday morning, which she attributes to taking meloxicam  on an empty stomach. She regularly takes Miralax for constipation. No  associated abdominal pain, nausea, vomiting.  She reports increased urinary frequency, having to get up three to four times last night, which is unusual for her. Her daughter suggested it might be a urinary tract infection.  She denies dysuria, gross hematuria, or pelvic pain.  Review of Systems  Constitutional:  Positive for activity change and fatigue. Negative for appetite change, chills and fever.  HENT:  Negative for facial swelling and mouth sores.   Respiratory:  Negative for cough, shortness of breath and wheezing.   Cardiovascular:  Negative for chest pain, palpitations and leg swelling.  Endocrine: Negative for cold intolerance and heat intolerance.  Genitourinary:  Negative for decreased urine volume, dysuria and hematuria.  Skin:  Negative for rash.  Neurological:  Negative for syncope and facial asymmetry.  Psychiatric/Behavioral:  Negative for confusion and hallucinations.   See other pertinent positives and negatives in HPI.  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   Blood in stool    Chicken pox    Chronic kidney disease    UTI and kidneystones   Constipation    unknown urology procedure 2006   Hiatal hernia    Kidney stones    Pneumonia 1968   Urinary tract infection    Allergies[2]  Social History   Socioeconomic History   Marital status: Married    Spouse name: Not on file   Number of children: Not on file   Years of education: Not on file   Highest  education level: Not on file  Occupational History   Not on file  Tobacco Use   Smoking status: Never   Smokeless tobacco: Never  Substance and Sexual Activity   Alcohol use: No   Drug use: No   Sexual activity: Not on file  Other Topics Concern   Not on file  Social History Narrative   Not on file   Social Drivers of Health   Tobacco Use: Low Risk (02/01/2024)   Patient History    Smoking Tobacco  Use: Never    Smokeless Tobacco Use: Never    Passive Exposure: Not on file  Financial Resource Strain: Low Risk (08/22/2023)   Overall Financial Resource Strain (CARDIA)    Difficulty of Paying Living Expenses: Not hard at all  Food Insecurity: No Food Insecurity (08/22/2023)   Epic    Worried About Programme Researcher, Broadcasting/film/video in the Last Year: Never true    Ran Out of Food in the Last Year: Never true  Transportation Needs: No Transportation Needs (08/22/2023)   Epic    Lack of Transportation (Medical): No    Lack of Transportation (Non-Medical): No  Physical Activity: Inactive (08/22/2023)   Exercise Vital Sign    Days of Exercise per Week: 0 days    Minutes of Exercise per Session: 0 min  Stress: No Stress Concern Present (08/22/2023)   [employer] of Occupational Health - Occupational Stress Questionnaire    Feeling of Stress: Not at all  Social Connections: Socially Integrated (08/22/2023)   Social Connection and Isolation Panel    Frequency of Communication with Friends and Family: More than three times a week    Frequency of Social Gatherings with Friends and Family: More than three times a week    Attends Religious Services: More than 4 times per year    Active Member of Clubs or Organizations: Yes    Attends Banker Meetings: More than 4 times per year    Marital Status: Married  Depression (PHQ2-9): Low Risk (11/21/2023)   Depression (PHQ2-9)    PHQ-2 Score: 1  Alcohol Screen: Low Risk (08/22/2023)   Alcohol Screen    Last Alcohol Screening Score (AUDIT): 0  Housing: Unknown (08/22/2023)   Epic    Unable to Pay for Housing in the Last Year: No    Number of Times Moved in the Last Year: Not on file    Homeless in the Last Year: No  Utilities: Not At Risk (08/22/2023)   Epic    Threatened with loss of utilities: No  Health Literacy: Adequate Health Literacy (08/22/2023)   B1300 Health Literacy    Frequency of need for help with medical instructions: Never    Vitals:   02/01/24 0958  BP: 118/82  Pulse: 85  Resp: 16  Temp: 99.5 F (37.5 C)  SpO2: 95%   Body mass index is 25.57 kg/m.  Physical Exam Constitutional:      General: She is not in acute distress.    Appearance: She is well-developed. She is not ill-appearing.  HENT:     Head: Normocephalic and atraumatic.     Nose: Congestion and rhinorrhea present.     Right Sinus: No maxillary sinus tenderness or frontal sinus tenderness.     Left Sinus: No maxillary sinus tenderness or frontal sinus tenderness.  Eyes:     Conjunctiva/sclera: Conjunctivae normal.  Cardiovascular:     Rate and Rhythm: Normal rate and regular rhythm.     Heart sounds: No murmur  heard. Pulmonary:     Effort: Pulmonary effort is normal. No respiratory distress.     Breath sounds: Normal breath sounds. No stridor.  Abdominal:     Palpations: Abdomen is soft. There is no mass.     Tenderness: There is no abdominal tenderness.  Musculoskeletal:     Lumbar back: No tenderness or bony tenderness. Negative right straight leg raise test and negative left straight leg raise test.  Lymphadenopathy:     Cervical: No cervical adenopathy.  Skin:    General: Skin is warm.     Findings: No erythema or rash.  Neurological:     General: No focal deficit present.     Mental Status: She is alert and oriented to person, place, and time.     Comments: Stable gait, not assisted.  Psychiatric:        Mood and Affect: Mood and affect normal.    ASSESSMENT AND PLAN:  Ms. Tanya Swanson was seen today for leg pain and sinus problem.  Diagnoses and all orders for this visit:  Orders Placed This Encounter  Procedures   Urinalysis with Culture Reflex   POCT Influenza A/B   POC COVID-19 BinaxNow   Peripheral polyneuropathy Assessment & Plan: She is symptomatic. We discussed possible causes, Also to consider lumbar radiculopathy. In the past she tried gabapentin , discontinued because of drowsiness. We  discussed treatment options, including duloxetine and Lyrica , she agrees with trying the latter one. Lyrica  25 mg at bedtime started today. Continue appropriate footcare. We discussed some side effects of medications. Fall precaution discussed. Follow-up in 4 weeks, before if needed.  Orders: -     Pregabalin ; Take 1 capsule (25 mg total) by mouth at bedtime.  Dispense: 30 capsule; Refill: 0  URI, acute Here in the office COVID-19 and rapid flu testing were negative. Most likely viral. For now recommend symptomatic treatment. She had some diarrhea yesterday. Increase fluid intake. Monitor for new symptoms, including fever. Tylenol  500 mg 3-4 times per day as needed, adequate hydration.  -     POCT Influenza A/B -     POC COVID-19 BinaxNow  Urinary frequency No other associated urinary symptoms. UA sent today. Monitor for new symptoms. Instructed about warning signs.  -     Urinalysis w microscopic + reflex cultur  Return in about 4 weeks (around 02/29/2024) for chronic problems.  Kalee Broxton G. Decklin Weddington, MD  [facility]. [place] office.     [1]  Current Outpatient Medications on File Prior to Visit  Medication Sig Dispense Refill   Acetaminophen  (TYLENOL  PO) Take 500 mg by mouth as needed.     Biotin 5 MG TABS Take by mouth.     Calcium -Vitamin D -Vitamin K 500-100-40 MG-UNT-MCG CHEW Chew by mouth.     Cholecalciferol (VITAMIN D3) 50 MCG (2000 UT) capsule Take by mouth.     cycloSPORINE (RESTASIS) 0.05 % ophthalmic emulsion Place 1 drop into both eyes 2 (two) times daily.     Famotidine (PEPCID PO) Take by mouth daily.     OVER THE COUNTER MEDICATION Taking [facility] feet and nerves. Take 1 capsule in a.m and 1 p.m     polyethylene glycol (MIRALAX / GLYCOLAX) packet Take 17 g by mouth daily as needed. For constipation.     Probiotic Product (PROBIOTIC DAILY PO) Take by mouth daily.     Current Facility-Administered Medications on File Prior to Visit   Medication Dose Route Frequency Provider Last Rate Last Admin  denosumab  (PROLIA ) injection 60 mg  60 mg Subcutaneous Q6 months Julene Rahn, Dickey MATSU, MD      [2]  Allergies Allergen Reactions   Penicillins Other (See Comments)    Has patient had a PCN reaction causing immediate rash, facial/tongue/throat swelling, SOB or lightheadedness with hypotension: Yes Has patient had a PCN reaction causing severe rash involving mucus membranes or skin necrosis: No Has patient had a PCN reaction that required hospitalization No Has patient had a PCN reaction occurring within the last 10 years: No If all of the above answers are NO, then may proceed with Cephalosporin use. Passed out Has patient had a PCN reaction causing immediate rash, facial/tongue/throat swelling, SOB or lightheadedness with hypotension: Yes Has patient had a PCN reaction causing severe rash involving mucus membranes or skin necrosis: No Has patient had a PCN reaction that required hospitalization No Has patient had a PCN reaction occurring within the last 10 years: No If all of the above answers are NO, then may proceed with Cephalosporin use. Passed out fainted Has patient had a PCN reaction causing immediate rash, facial/tongue/throat swelling, SOB or lightheadedness with hypotension: Yes Has patient had a PCN reaction causing severe rash involving mucus membranes or skin necrosis: No Has patient had a PCN reaction that required hospitalization No Has patient had a PCN reaction occurring within the last 10 years: No If all of the above answers are NO, then may proceed with Cephalosporin use. Passed out fainted Has patient had a PCN reaction causing immediate rash, facial/tongue/throat swelling, SOB or lightheadedness with hypotension: Yes Has patient had a PCN reaction causing severe rash involving mucus membranes or skin necrosis: No Has patient had a PCN reaction that required hospitalization No Has patient had a PCN  reaction occurring within the last 10 years: No If all of the above answers are NO, then may proceed with Cephalosporin use. Passed out Has patient had a PCN reaction causing immediate rash, facial/tongue/throat swelling, SOB or lightheadedness with hypotension: Yes Has patient had a PCN reaction causing severe rash involving mucus membranes or skin necrosis: No Has patient had a PCN reaction that required hospitalization No Has patient had a PCN reaction occurring within the last 10 years: No If all of the above answers are NO, then may proceed with Cephalosporin use. Passed out fainted   "

## 2024-02-02 ENCOUNTER — Telehealth: Payer: Self-pay

## 2024-02-02 ENCOUNTER — Other Ambulatory Visit (HOSPITAL_COMMUNITY): Payer: Self-pay

## 2024-02-02 ENCOUNTER — Other Ambulatory Visit: Payer: Self-pay

## 2024-02-02 ENCOUNTER — Encounter (HOSPITAL_COMMUNITY): Payer: Self-pay | Admitting: Hospitalist

## 2024-02-02 ENCOUNTER — Ambulatory Visit: Payer: Self-pay

## 2024-02-02 ENCOUNTER — Observation Stay (HOSPITAL_COMMUNITY)
Admission: EM | Admit: 2024-02-02 | Discharge: 2024-02-05 | Disposition: A | Attending: Hospitalist | Admitting: Hospitalist

## 2024-02-02 ENCOUNTER — Emergency Department (HOSPITAL_COMMUNITY)

## 2024-02-02 DIAGNOSIS — R8281 Pyuria: Secondary | ICD-10-CM | POA: Insufficient documentation

## 2024-02-02 DIAGNOSIS — K529 Noninfective gastroenteritis and colitis, unspecified: Principal | ICD-10-CM | POA: Diagnosis present

## 2024-02-02 DIAGNOSIS — N189 Chronic kidney disease, unspecified: Secondary | ICD-10-CM | POA: Insufficient documentation

## 2024-02-02 DIAGNOSIS — A029 Salmonella infection, unspecified: Secondary | ICD-10-CM | POA: Insufficient documentation

## 2024-02-02 DIAGNOSIS — R197 Diarrhea, unspecified: Principal | ICD-10-CM

## 2024-02-02 DIAGNOSIS — G8929 Other chronic pain: Secondary | ICD-10-CM | POA: Insufficient documentation

## 2024-02-02 DIAGNOSIS — G629 Polyneuropathy, unspecified: Secondary | ICD-10-CM | POA: Insufficient documentation

## 2024-02-02 DIAGNOSIS — M545 Low back pain, unspecified: Secondary | ICD-10-CM | POA: Diagnosis present

## 2024-02-02 DIAGNOSIS — E860 Dehydration: Secondary | ICD-10-CM

## 2024-02-02 DIAGNOSIS — K219 Gastro-esophageal reflux disease without esophagitis: Secondary | ICD-10-CM | POA: Insufficient documentation

## 2024-02-02 LAB — COMPREHENSIVE METABOLIC PANEL WITH GFR
ALT: 25 U/L (ref 0–44)
AST: 36 U/L (ref 15–41)
Albumin: 3.7 g/dL (ref 3.5–5.0)
Alkaline Phosphatase: 71 U/L (ref 38–126)
Anion gap: 14 (ref 5–15)
BUN: 12 mg/dL (ref 8–23)
CO2: 21 mmol/L — ABNORMAL LOW (ref 22–32)
Calcium: 8.6 mg/dL — ABNORMAL LOW (ref 8.9–10.3)
Chloride: 104 mmol/L (ref 98–111)
Creatinine, Ser: 0.93 mg/dL (ref 0.44–1.00)
GFR, Estimated: >60 mL/min
Glucose, Bld: 123 mg/dL — ABNORMAL HIGH (ref 70–99)
Potassium: 3.7 mmol/L (ref 3.5–5.1)
Sodium: 140 mmol/L (ref 135–145)
Total Bilirubin: 0.3 mg/dL (ref 0.0–1.2)
Total Protein: 6.5 g/dL (ref 6.5–8.1)

## 2024-02-02 LAB — CBC WITH DIFFERENTIAL/PLATELET
Abs Immature Granulocytes: 0.04 K/uL (ref 0.00–0.07)
Basophils Absolute: 0 K/uL (ref 0.0–0.1)
Basophils Relative: 0 %
Eosinophils Absolute: 0 K/uL (ref 0.0–0.5)
Eosinophils Relative: 0 %
HCT: 37.1 % (ref 36.0–46.0)
Hemoglobin: 12.1 g/dL (ref 12.0–15.0)
Immature Granulocytes: 0 %
Lymphocytes Relative: 7 %
Lymphs Abs: 0.6 K/uL — ABNORMAL LOW (ref 0.7–4.0)
MCH: 30.1 pg (ref 26.0–34.0)
MCHC: 32.6 g/dL (ref 30.0–36.0)
MCV: 92.3 fL (ref 80.0–100.0)
Monocytes Absolute: 0.5 K/uL (ref 0.1–1.0)
Monocytes Relative: 6 %
Neutro Abs: 7.9 K/uL — ABNORMAL HIGH (ref 1.7–7.7)
Neutrophils Relative %: 87 %
Platelets: 218 K/uL (ref 150–400)
RBC: 4.02 MIL/uL (ref 3.87–5.11)
RDW: 12.6 % (ref 11.5–15.5)
WBC: 9.1 K/uL (ref 4.0–10.5)
nRBC: 0 % (ref 0.0–0.2)

## 2024-02-02 LAB — LIPASE, BLOOD: Lipase: 34 U/L (ref 11–51)

## 2024-02-02 LAB — C DIFFICILE QUICK SCREEN W PCR REFLEX
C Diff antigen: NEGATIVE
C Diff interpretation: NOT DETECTED
C Diff toxin: NEGATIVE

## 2024-02-02 LAB — MAGNESIUM: Magnesium: 1.9 mg/dL (ref 1.7–2.4)

## 2024-02-02 MED ORDER — ACETAMINOPHEN 325 MG PO TABS
650.0000 mg | ORAL_TABLET | Freq: Four times a day (QID) | ORAL | Status: DC | PRN
  Administered 2024-02-02 – 2024-02-05 (×3): 650 mg via ORAL
  Filled 2024-02-02 (×3): qty 2

## 2024-02-02 MED ORDER — HYDROCODONE-ACETAMINOPHEN 5-325 MG PO TABS: 1.0000 | ORAL_TABLET | ORAL | Status: DC | PRN

## 2024-02-02 MED ORDER — MELATONIN 3 MG PO TABS
3.0000 mg | ORAL_TABLET | Freq: Every evening | ORAL | Status: DC | PRN
  Administered 2024-02-02: 3 mg via ORAL
  Filled 2024-02-02: qty 1

## 2024-02-02 MED ORDER — LACTATED RINGERS IV BOLUS
500.0000 mL | Freq: Once | INTRAVENOUS | Status: AC
  Administered 2024-02-02: 500 mL via INTRAVENOUS

## 2024-02-02 MED ORDER — KCL IN DEXTROSE-NACL 20-5-0.45 MEQ/L-%-% IV SOLN
INTRAVENOUS | Status: DC
  Filled 2024-02-02 (×2): qty 1000

## 2024-02-02 MED ORDER — IOHEXOL 300 MG/ML  SOLN
100.0000 mL | Freq: Once | INTRAMUSCULAR | Status: AC | PRN
  Administered 2024-02-02: 100 mL via INTRAVENOUS

## 2024-02-02 MED ORDER — ENOXAPARIN SODIUM 40 MG/0.4ML IJ SOSY
40.0000 mg | PREFILLED_SYRINGE | INTRAMUSCULAR | Status: DC
  Administered 2024-02-02 – 2024-02-04 (×3): 40 mg via SUBCUTANEOUS
  Filled 2024-02-02 (×3): qty 0.4

## 2024-02-02 MED ORDER — LACTATED RINGERS IV SOLN: INTRAVENOUS | Status: DC

## 2024-02-02 MED ORDER — ACETAMINOPHEN 650 MG RE SUPP: 650.0000 mg | Freq: Four times a day (QID) | RECTAL | Status: DC | PRN

## 2024-02-02 NOTE — H&P (Signed)
 " History and Physical    Tanya Swanson FMW:[id number] DOB: 1940/05/18 DOA: 02/02/2024  PCP: Jordan, Betty G, MD   Patient coming from: Home  I have personally briefly reviewed patient's old medical records in [facility]  Chief Complaint: Diarrhea, dehydration  HPI: Tanya Swanson is a 84 y.o. female with medical history significant of acid reflux, CKD, hiatal hernia and UTIs who presents to the ER with complaints of nausea and loose stool for the past 2 days.  Diarrhea got worse last night.  She says she had gone to the bathroom 5 or 6 times, each time having loose stools.  Reports very mild abdominal discomfort and nausea as well.  He says he has not eaten well for the past 2 days.  Denies fever or chills.  Denies vomiting.  ED Course: Blood pressure initially soft in the ER with systolic 80s.  This improved with IV fluid bolus.  She was clinically dehydrated.  Labs surprisingly looked okay.  CT of abdomen and pelvis revealed colitis on the left side with no abscess or other complications.  C. difficile PCR is negative. Patient admitted for further management.  Review of Systems: As per HPI otherwise all other systems were reviewed and are negative.   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   Blood in stool    Chicken pox    Chronic kidney disease    UTI and kidneystones   Constipation    unknown urology procedure 2006   Hiatal hernia    Kidney stones    Pneumonia 1968   Urinary tract infection     Past Surgical History:  Procedure Laterality Date   CATARACT EXTRACTION W/PHACO Right 02/24/2015   Procedure: CATARACT EXTRACTION PHACO AND INTRAOCULAR LENS PLACEMENT RIGHT EYE CDE=8.01;  Surgeon: Cherene Mania, MD;  Location: [facility];  Service: Ophthalmology;  Laterality: Right;   CATARACT EXTRACTION W/PHACO Left 04/03/2015   Procedure: CATARACT EXTRACTION PHACO AND INTRAOCULAR LENS PLACEMENT (IOC);  Surgeon: Cherene Mania, MD;  Location: [facility];  Service: Ophthalmology;  Laterality: Left;  CDE:11.74   DILATION AND CURETTAGE OF UTERUS     LITHOTRIPSY     TONSILLECTOMY  1951     reports that she has never smoked. She has never used smokeless tobacco. She reports that she does not drink alcohol and does not use drugs.  Allergies[1]  Family History  Problem Relation Age of Onset   Arthritis Mother    Hypertension Mother    Arthritis Father    Hypertension Father    Breast cancer Maternal Aunt 33 - 89    Prior to Admission medications  Medication Sig Start Date End Date Taking? Authorizing Provider  Acetaminophen  (TYLENOL  PO) Take 500 mg by mouth as needed.    [provider]  Biotin 5 MG TABS Take by mouth.    [provider]  Calcium -Vitamin D -Vitamin K 500-100-40 MG-UNT-MCG CHEW Chew by mouth.    [provider]  Cholecalciferol (VITAMIN D3) 50 MCG (2000 UT) capsule Take by mouth.    [provider]  cycloSPORINE (RESTASIS) 0.05 % ophthalmic emulsion Place 1 drop into both eyes 2 (two) times daily.    [provider]  Famotidine (PEPCID PO) Take by mouth daily.    [provider]  OVER THE COUNTER MEDICATION Taking [facility] feet and nerves. Take 1 capsule in a.m and 1 p.m  [provider]  polyethylene glycol (MIRALAX / GLYCOLAX) packet Take 17 g by mouth daily as needed. For constipation.    [provider]  pregabalin  (LYRICA ) 25 MG capsule Take 1 capsule (25 mg total) by mouth at bedtime. 02/01/24   Jordan, Betty G, MD  Probiotic Product (PROBIOTIC DAILY PO) Take by mouth daily.    [provider]    Physical Exam: Vitals:   02/02/24 1400 02/02/24 1415 02/02/24 1428 02/02/24 1430  BP: (!) 103/56 (!) 100/57  (!) 105/55  Pulse: 80 82  82  Resp: 15 (!) 29  20  Temp:   99.8 F (37.7 C)   TempSrc:   Oral   SpO2: 97% 93%  93%    Constitutional: NAD, calm, comfortable Vitals:   02/02/24 1400  02/02/24 1415 02/02/24 1428 02/02/24 1430  BP: (!) 103/56 (!) 100/57  (!) 105/55  Pulse: 80 82  82  Resp: 15 (!) 29  20  Temp:   99.8 F (37.7 C)   TempSrc:   Oral   SpO2: 97% 93%  93%   General: Alert, oriented not in any acute distress HEENT: Dry oral mucosa, lips Chest: Clear CVs: S1, S2, no murmur, regular rhythm Abdomen: Soft, nontender Extremities: No edema   Labs on Admission: I have personally reviewed following labs and imaging studies  CBC: Recent Labs  Lab 02/02/24 1048  WBC 9.1  NEUTROABS 7.9*  HGB 12.1  HCT 37.1  MCV 92.3  PLT 218   Basic Metabolic Panel: Recent Labs  Lab 02/02/24 1048  NA 140  K 3.7  CL 104  CO2 21*  GLUCOSE 123*  BUN 12  CREATININE 0.93  CALCIUM  8.6*  MG 1.9   GFR: Estimated Creatinine Clearance: 45.7 mL/min (by C-G formula based on SCr of 0.93 mg/dL). Liver Function Tests: Recent Labs  Lab 02/02/24 1048  AST 36  ALT 25  ALKPHOS 71  BILITOT 0.3  PROT 6.5  ALBUMIN 3.7   Recent Labs  Lab 02/02/24 1048  LIPASE 34   No results for input(s): AMMONIA in the last 168 hours. Coagulation Profile: No results for input(s): INR, PROTIME in the last 168 hours. Cardiac Enzymes: No results for input(s): CKTOTAL, CKMB, CKMBINDEX, TROPONINI in the last 168 hours. BNP (last 3 results) No results for input(s): PROBNP in the last 8760 hours. HbA1C: No results for input(s): HGBA1C in the last 72 hours. CBG: No results for input(s): GLUCAP in the last 168 hours. Lipid Profile: No results for input(s): CHOL, HDL, LDLCALC, TRIG, CHOLHDL, LDLDIRECT in the last 72 hours. Thyroid  Function Tests: No results for input(s): TSH, T4TOTAL, FREET4, T3FREE, THYROIDAB in the last 72 hours. Anemia Panel: No results for input(s): VITAMINB12, FOLATE, FERRITIN, TIBC, IRON, RETICCTPCT in the last 72 hours. Urine analysis:    Component Value Date/Time   COLORURINE DARK YELLOW 02/01/2024 1040    APPEARANCEUR CLOUDY (A) 02/01/2024 1040   LABSPEC 1.020 02/01/2024 1040   PHURINE 6.5 02/01/2024 1040   GLUCOSEU NEGATIVE 02/01/2024 1040   GLUCOSEU NEGATIVE 09/23/2020 0941   HGBUR TRACE (A) 02/01/2024 1040   BILIRUBINUR neg 08/23/2022 1002   KETONESUR NEGATIVE 02/01/2024 1040   PROTEINUR TRACE (A) 02/01/2024 1040   UROBILINOGEN 0.2 08/23/2022 1002   UROBILINOGEN 0.2 09/23/2020 0941   NITRITE neg 08/23/2022 1002   NITRITE NEGATIVE 09/23/2020 0941   LEUKOCYTESUR Small (1+) (A) 08/23/2022 1002   LEUKOCYTESUR TRACE (A) 09/23/2020 0941    Radiological Exams on Admission: CT ABDOMEN PELVIS W CONTRAST  Result Date: 02/02/2024 EXAM: CT ABDOMEN AND PELVIS WITH CONTRAST 02/02/2024 12:31:24 PM TECHNIQUE: CT of the abdomen and pelvis was performed with the administration of intravenous contrast. Multiplanar reformatted images are provided for review. Automated exposure control, iterative reconstruction, and/or weight-based adjustment of the mA/kV was utilized to reduce the radiation dose to as low as reasonably achievable. 100 mL (iohexol  (OMNIPAQUE ) 300 MG/ML solution 100 mL IOHEXOL  300 MG/ML SOLN) was administered. COMPARISON: None available. CLINICAL HISTORY: Generalized abdominal pain, diarrhea. FINDINGS: LOWER CHEST: Posterior bibasilar dependent atelectasis. Subsegmental atelectasis in the lingula and left lower lobe. LIVER: The liver is unremarkable. GALLBLADDER AND BILE DUCTS: Gallbladder is unremarkable. No biliary ductal dilatation. SPLEEN: No acute abnormality. PANCREAS: No acute abnormality. ADRENAL GLANDS: No acute abnormality. KIDNEYS, URETERS AND BLADDER: Fluid filled extrarenal pelves without hydronephrosis. Cortically based and peripelvic cysts noted in the left kidney. Per consensus, no follow-up is needed for simple Bosniak type 1 and 2 renal cysts, unless the patient has a malignancy history or risk factors. No stones in the kidneys or ureters. No perinephric or periureteral stranding.  Urinary bladder is unremarkable. GI AND BOWEL: Partially visualized large hiatal hernia containing the majority of the stomach. Mild wall thickening with mucosal enhancement throughout the descending and sigmoid colon and including the rectum. Sigmoid diverticulosis. 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 There is no bowel obstruction. PERITONEUM AND RETROPERITONEUM: No ascites. No free air. VASCULATURE: Aorta is normal in caliber. Scattered aortic atherosclerosis. LYMPH NODES: No lymphadenopathy. REPRODUCTIVE ORGANS: Age related atrophy of the uterus and ovaries. No free pelvic fluid. BONES AND SOFT TISSUES: Osteopenia. 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 Facet arthropathy throughout the lumbar spine. Mild, grade 1 anterolisthesis of L4 on L5. No focal soft tissue abnormality. IMPRESSION: 1. Mild wall thickening with mucosal enhancement throughout the descending and sigmoid colon and including the rectum, consistent with an infectious or inflammatory proctocolitis. 2. Sigmoid diverticulosis. No changes of acute diverticulitis. Electronically signed by: Rogelia Myers MD 02/02/2024 01:15 PM EST RP Workstation: [id number]    EKG: Independently reviewed.   Assessment/Plan  84 year old female admitted with dehydration/diarrhea with CT findings of left-sided colitis.   Diarrhea/colitis: -CT shows colitis on the left side.  Likely viral.  Patient denies history of Crohn's disease or ulcerative colitis.  She denies history of C. difficile but she took some antibiotics about a month ago for some cystic infection on her left inner thigh - C. difficile PCR is negative - White count is normal. - Will hydrate with IV D5 with normal saline plus potassium.  Will hold off on antibiotics at this time but could initiate if worsening symptoms - Stool GI  panel is pending    DVT prophylaxis: lovenox   Code Status: full  Family Communication: daughter at bedside  Disposition Plan: home  Consults called:  Admission status: obs   Severity of Illness: The appropriate patient status for this patient is OBSERVATION. Observation status is judged to be reasonable and necessary in order to provide the required intensity of service to ensure the patient's safety. The patient's presenting symptoms, physical exam findings, and initial radiographic and laboratory data in the context of their medical condition is felt to place them at decreased risk for further clinical deterioration. Furthermore, it is anticipated that the patient will be medically stable for discharge from the hospital within 2 midnights  of admission.     Derryl Duval MD [facility]  02/02/2024, 4:19 PM        [1]  Allergies Allergen Reactions   Penicillins Other (See Comments)    Has patient had a PCN reaction causing immediate rash, facial/tongue/throat swelling, SOB or lightheadedness with hypotension: Yes Has patient had a PCN reaction causing severe rash involving mucus membranes or skin necrosis: No Has patient had a PCN reaction that required hospitalization No Has patient had a PCN reaction occurring within the last 10 years: No If all of the above answers are NO, then may proceed with Cephalosporin use. Passed out Has patient had a PCN reaction causing immediate rash, facial/tongue/throat swelling, SOB or lightheadedness with hypotension: Yes Has patient had a PCN reaction causing severe rash involving mucus membranes or skin necrosis: No Has patient had a PCN reaction that required hospitalization No Has patient had a PCN reaction occurring within the last 10 years: No If all of the above answers are NO, then may proceed with Cephalosporin use. Passed out fainted Has patient had a PCN reaction causing immediate rash, facial/tongue/throat swelling, SOB  or lightheadedness with hypotension: Yes Has patient had a PCN reaction causing severe rash involving mucus membranes or skin necrosis: No Has patient had a PCN reaction that required hospitalization No Has patient had a PCN reaction occurring within the last 10 years: No If all of the above answers are NO, then may proceed with Cephalosporin use. Passed out fainted Has patient had a PCN reaction causing immediate rash, facial/tongue/throat swelling, SOB or lightheadedness with hypotension: Yes Has patient had a PCN reaction causing severe rash involving mucus membranes or skin necrosis: No Has patient had a PCN reaction that required hospitalization No Has patient had a PCN reaction occurring within the last 10 years: No If all of the above answers are NO, then may proceed with Cephalosporin use. Passed out Has patient had a PCN reaction causing immediate rash, facial/tongue/throat swelling, SOB or lightheadedness with hypotension: Yes Has patient had a PCN reaction causing severe rash involving mucus membranes or skin necrosis: No Has patient had a PCN reaction that required hospitalization No Has patient had a PCN reaction occurring within the last 10 years: No If all of the above answers are NO, then may proceed with Cephalosporin use. Passed out fainted   "

## 2024-02-02 NOTE — ED Provider Notes (Signed)
 " [place] EMERGENCY DEPARTMENT AT [facility] Provider Note   CSN: [id number] Arrival date & time: 02/02/24  1021     Patient presents with: Diarrhea (X2 days)   Tanya Swanson is a 84 y.o. female.   Patient is an 84 year old female who presents to Emergency Department with a chief complaint of diarrhea for approximate the past 2 to 3 days, increased weakness and intermittent nausea.  She denies any vomiting but has had some dry heaving.  Patient notes that she was on antibiotics in December secondary to an abscess.  Patient notes that she has had minimal abdominal pain.  She denies any associated fever or chills but EMS notes that she was febrile.  Patient has had no chest pain or shortness of breath.  She denies any recent falls.   Diarrhea      Prior to Admission medications  Medication Sig Start Date End Date Taking? Authorizing Provider  Acetaminophen  (TYLENOL  PO) Take 500 mg by mouth as needed.    [provider]  Biotin 5 MG TABS Take by mouth.    [provider]  Calcium -Vitamin D -Vitamin K 500-100-40 MG-UNT-MCG CHEW Chew by mouth.    [provider]  Cholecalciferol (VITAMIN D3) 50 MCG (2000 UT) capsule Take by mouth.    [provider]  cycloSPORINE (RESTASIS) 0.05 % ophthalmic emulsion Place 1 drop into both eyes 2 (two) times daily.    [provider]  Famotidine (PEPCID PO) Take by mouth daily.    [provider]  OVER THE COUNTER MEDICATION Taking [facility] feet and nerves. Take 1 capsule in a.m and 1 p.m    [provider]  polyethylene glycol (MIRALAX / GLYCOLAX) packet Take 17 g by mouth daily as needed. For constipation.    [provider]  pregabalin  (LYRICA ) 25 MG capsule Take 1 capsule (25 mg total) by mouth at bedtime. 02/01/24   Jordan, Betty G, MD  Probiotic Product (PROBIOTIC DAILY PO) Take by mouth daily.    [provider]    Allergies:  Penicillins    Review of Systems  Gastrointestinal:  Positive for diarrhea and nausea.  All other systems reviewed and are negative.   Updated Vital Signs BP (!) 105/57 (BP Location: Right Arm)   Pulse 84   Temp 98.4 F (36.9 C) (Oral)   Resp 18   SpO2 97%   Physical Exam Vitals and nursing note reviewed.  Constitutional:      General: She is not in acute distress.    Appearance: Normal appearance. She is not ill-appearing.  HENT:     Head: Normocephalic and atraumatic.     Nose: Nose normal.     Mouth/Throat:     Mouth: Mucous membranes are moist.  Eyes:     Extraocular Movements: Extraocular movements intact.     Conjunctiva/sclera: Conjunctivae normal.     Pupils: Pupils are equal, round, and reactive to light.  Cardiovascular:     Rate and Rhythm: Normal rate and regular rhythm.     Pulses: Normal pulses.     Heart sounds: Normal heart sounds. No murmur heard.    No gallop.  Pulmonary:     Effort: Pulmonary effort is normal. No respiratory distress.     Breath sounds: Normal breath sounds. No stridor. No wheezing, rhonchi or rales.  Abdominal:     General: Abdomen is flat. Bowel sounds are normal. There is no distension.     Palpations: Abdomen is  soft.     Tenderness: There is no abdominal tenderness. There is no guarding.  Musculoskeletal:        General: Normal range of motion.     Cervical back: Normal range of motion and neck supple. No rigidity or tenderness.  Skin:    General: Skin is warm and dry.     Findings: No rash.  Neurological:     General: No focal deficit present.     Mental Status: She is alert and oriented to person, place, and time. Mental status is at baseline.     Cranial Nerves: No cranial nerve deficit.     Sensory: No sensory deficit.     Motor: No weakness.     Coordination: Coordination normal.  Psychiatric:        Mood and Affect: Mood normal.        Behavior: Behavior normal.        Thought Content: Thought content normal.         Judgment: Judgment normal.     (all labs ordered are listed, but only abnormal results are displayed) Labs Reviewed  GASTROINTESTINAL PANEL BY PCR, STOOL (REPLACES STOOL CULTURE)  C DIFFICILE QUICK SCREEN W PCR REFLEX    COMPREHENSIVE METABOLIC PANEL WITH GFR  LIPASE, BLOOD  CBC WITH DIFFERENTIAL/PLATELET  URINALYSIS, ROUTINE W REFLEX MICROSCOPIC  MAGNESIUM    EKG: EKG Interpretation Date/Time:  Thursday February 02 2024 10:35:04 EST Ventricular Rate:  85 PR Interval:  147 QRS Duration:  94 QT Interval:  365 QTC Calculation: 434 R Axis:   69  Text Interpretation: Sinus rhythm Borderline T abnormalities, anterior leads No significant change since 2/17 Confirmed by Towana Sharper [phone]) on 02/02/2024 10:40:52 AM  Radiology: No results found.   Procedures   Medications Ordered in the ED  lactated ringers  bolus 500 mL (has no administration in time range)                                    Medical Decision Making Amount and/or Complexity of Data Reviewed Labs: ordered. Radiology: ordered.  Risk Prescription drug management. Decision regarding hospitalization.   This patient presents to the ED for concern of diarrhea, generalized weakness, this involves an extensive number of treatment options, and is a complaint that carries with it a high risk of complications and morbidity.  The differential diagnosis includes viral gastroenteritis, infectious colitis, diverticulitis, dehydration, acute kidney injury, electrolyte derangement, acute appendicitis, cholecystitis, small bowel obstruction, pancreatitis, mesenteric ischemia   Co morbidities that complicate the patient evaluation  Chronic kidney disease   Additional history obtained:  Additional history obtained from family External records from outside source obtained and reviewed including medical records   Lab Tests:  I Ordered, and personally interpreted labs.  The pertinent results include: No  leukocytosis, no anemia, normal kidney function liver function, unremarkable electrolytes, normal magnesium, normal lipase   Imaging Studies ordered:  I ordered imaging studies including CT scan abdomen and pelvis I independently visualized and interpreted imaging which showed inflammation within the descending colon, sigmoid colon, rectum I agree with the radiologist interpretation   Cardiac Monitoring: / EKG:  The patient was maintained on a cardiac monitor.  I personally viewed and interpreted the cardiac monitored which showed an underlying rhythm of: Normal sinus rhythm, no ST/T wave changes, no ischemic changes, no STEMI   Consultations Obtained:  I requested consultation with the hospitalist,  and discussed  lab and imaging findings as well as pertinent plan - they recommend: Admission   Problem List / ED Course / Critical interventions / Medication management  Patient is doing well at this time and does remain stable.  Discussed with patient that we will plan for admission to the hospital service given her ongoing soft blood pressures, diarrhea and apparent dehydration.  Blood work has been unremarkable at this point.  CT is consistent with proctocolitis.  She was able to provide a stool sample which has been sent.  I have started on maintenance fluids at this point.  Have discussed patient case with Dr. Sigdel with the hospitalist service who has excepted for admission. I ordered medication including IV fluids for dehydration and diarrhea Reevaluation of the patient after these medicines showed that the patient improved I have reviewed the patients home medicines and have made adjustments as needed   Social Determinants of Health:  None   Test / Admission - Considered:  Admission     Final diagnoses:  None    ED Discharge Orders     None          Daralene Lonni JONETTA DEVONNA 02/02/24 1349    Towana Ozell BROCKS, MD 02/02/24 1745  "

## 2024-02-02 NOTE — ED Triage Notes (Signed)
 Pt biba with complaints of loose stool x2 days with increased weakness. Pt remains a&Ox4. Denies any nausea or vomiting at this time. Pt seen by pcp yesterday and tested negative for flu and covid.

## 2024-02-02 NOTE — Telephone Encounter (Signed)
 Pharmacy Patient Advocate Encounter   Received notification from [facility] KEY that prior authorization for Pregabalin  25 caps is required/requested.   Insurance verification completed.   The patient is insured through [facility].   Per test claim: PA required; PA submitted to above mentioned insurance via Latent Key/confirmation #/EOC [id number] Status is pending

## 2024-02-02 NOTE — Plan of Care (Signed)

## 2024-02-02 NOTE — Telephone Encounter (Signed)
 FYI Only or Action Required?: FYI only for provider: ED advised.  Patient was last seen in primary care on 02/01/2024 by Jordan, Tanya G, MD.  Called Nurse Triage reporting Diarrhea.  Symptoms began yesterday.  Interventions attempted: Rest, hydration, or home remedies.  Symptoms are: gradually worsening- pt c/o very dry mouth and weakness. Was advising daughter where things are aroud the house in case she dies. Daughter stated she does that.  Triage Disposition: Go to ED Now (or PCP Triage)  Patient/caregiver understands and will follow disposition?: YesMessage from Avram MATSU sent at 02/02/2024  8:38 AM EST  Reason for Triage: was seen recently and patient stated she feels very bad and stomach hurts   Reason for Disposition  [1] Drinking very little AND [2] dehydration suspected (e.Swanson., no urine > 12 hours, very dry mouth, very lightheaded)  Answer Assessment - Initial Assessment Questions Pt > 10 episodes watery non blood diarrhea, pt mouth very dry and weak. Was in office yesterday.      1. DIARRHEA SEVERITY: How bad is the diarrhea? How many more stools have you had in the past 24 hours than normal?      Incontinent 10 times  2. ONSET: When did the diarrhea begin?      Last night  3. STOOL DESCRIPTION:  How loose or watery is the diarrhea? What is the stool color? Is there any blood or mucous in the stool?     Watery /no blood  4. VOMITING: Are you also vomiting? If Yes, ask: How many times in the past 24 hours?      no 5. ABDOMEN PAIN: Are you having any abdomen pain? If Yes, ask: What does it feel like? (e.Swanson., crampy, dull, intermittent, constant)      [place] area 6. ABDOMEN PAIN SEVERITY: If present, ask: How bad is the pain?  (e.Swanson., Scale 1-10; mild, moderate, or severe)     5/10 7. ORAL INTAKE: If vomiting, Have you been able to drink liquids? How much liquids have you had in the past 24 hours?     N/a 8. HYDRATION: Any signs of dehydration?  (e.Swanson., dry mouth [not just dry lips], too weak to stand, dizziness, new weight loss) When did you last urinate?     Dry mouth //last urinated 2 hours  10. ANTIBIOTIC USE: Are you taking antibiotics now or have you taken antibiotics in the past 2 months?       December 2025 11. OTHER SYMPTOMS: Do you have any other symptoms? (e.Swanson., fever, blood in stool)       nausea  Protocols used: Diarrhea-A-AH

## 2024-02-02 NOTE — Plan of Care (Signed)
   Problem: Activity: Goal: Risk for activity intolerance will decrease Outcome: Progressing   Problem: Coping: Goal: Level of anxiety will decrease Outcome: Progressing

## 2024-02-03 ENCOUNTER — Ambulatory Visit: Payer: Self-pay | Admitting: Family Medicine

## 2024-02-03 DIAGNOSIS — K529 Noninfective gastroenteritis and colitis, unspecified: Secondary | ICD-10-CM | POA: Diagnosis not present

## 2024-02-03 LAB — GASTROINTESTINAL PANEL BY PCR, STOOL (REPLACES STOOL CULTURE)

## 2024-02-03 LAB — BASIC METABOLIC PANEL WITH GFR
Anion gap: 12 (ref 5–15)
BUN: 10 mg/dL (ref 8–23)
CO2: 20 mmol/L — ABNORMAL LOW (ref 22–32)
Calcium: 8.3 mg/dL — ABNORMAL LOW (ref 8.9–10.3)
Chloride: 107 mmol/L (ref 98–111)
Creatinine, Ser: 0.72 mg/dL (ref 0.44–1.00)
GFR, Estimated: >60 mL/min
Glucose, Bld: 107 mg/dL — ABNORMAL HIGH (ref 70–99)
Potassium: 3.5 mmol/L (ref 3.5–5.1)
Sodium: 138 mmol/L (ref 135–145)

## 2024-02-03 LAB — URINALYSIS W MICROSCOPIC + REFLEX CULTURE
Bilirubin Urine: NEGATIVE
Glucose, UA: NEGATIVE
Ketones, ur: NEGATIVE
Nitrites, Initial: NEGATIVE
RBC / HPF: NONE SEEN /HPF (ref 0–2)
Specific Gravity, Urine: 1.02 (ref 1.001–1.035)
pH: 6.5 (ref 5.0–8.0)

## 2024-02-03 LAB — CBC
HCT: 34.8 % — ABNORMAL LOW (ref 36.0–46.0)
Hemoglobin: 11.3 g/dL — ABNORMAL LOW (ref 12.0–15.0)
MCH: 29.8 pg (ref 26.0–34.0)
MCHC: 32.5 g/dL (ref 30.0–36.0)
MCV: 91.8 fL (ref 80.0–100.0)
Platelets: 208 K/uL (ref 150–400)
RBC: 3.79 MIL/uL — ABNORMAL LOW (ref 3.87–5.11)
RDW: 12.8 % (ref 11.5–15.5)
WBC: 6 K/uL (ref 4.0–10.5)
nRBC: 0 % (ref 0.0–0.2)

## 2024-02-03 LAB — CULTURE INDICATED

## 2024-02-03 LAB — URINE CULTURE
MICRO NUMBER:: 17498519
Result:: NO GROWTH
SPECIMEN QUALITY:: ADEQUATE

## 2024-02-03 MED ORDER — POTASSIUM CHLORIDE 20 MEQ PO PACK
40.0000 meq | PACK | Freq: Two times a day (BID) | ORAL | Status: AC
  Administered 2024-02-03 (×2): 40 meq via ORAL
  Filled 2024-02-03 (×2): qty 2

## 2024-02-03 NOTE — Plan of Care (Signed)

## 2024-02-03 NOTE — Care Management Obs Status (Signed)
 MEDICARE OBSERVATION STATUS NOTIFICATION   Patient Details  Name: Tanya Swanson MRN: [id number] Date of Birth: 1940/05/14   Medicare Observation Status Notification Given:  Yes    Duwaine LITTIE Ada 02/03/2024, 2:28 PM

## 2024-02-03 NOTE — Progress Notes (Signed)
" °   02/03/24 0945  Provider Notification  Provider Name/Title Dr. Mcarthur  Date Provider Notified 02/03/24  Time Provider Notified 0945  Method of Notification Page  Notification Reason Critical Result  Test performed and critical result GI stool panel- salmonella postiive  Date Critical Result Received 02/03/24  Time Critical Result Received 0945  Provider response No new orders    "

## 2024-02-03 NOTE — Progress Notes (Addendum)
 " PROGRESS NOTE    Tanya Swanson  FMW:[id number] DOB: October 03, 1940 DOA: 02/02/2024 PCP: Jordan, Betty G, MD   Brief Narrative:   Tanya Swanson is a 84 y.o. female with medical history significant of acid reflux, CKD, hiatal hernia and UTIs who presents to the ER with complaints of nausea and loose stool f for 2 days prior to presentation (onset ~1/20).  Patient reports some abdominal discomfort and poor appetite. Blood pressure was initially soft in the ER improved with IV fluid bolus.  CT appeared grossly dehydrated.  Labs look surprisingly okay.  CT of abdomen and pelvis revealed colitis on the left side with no abscess or other complications.  CT PCR was negative.  Patient admitted for further management.   Assessment & Plan:   Principal Problem:   Colitis   Diarrhea/colitis: -CT shows colitis on the left side.  No history of Crohn's disease or ulcerative colitis. - C. difficile PCR is negative -Stool PCR is positive for Salmonella species.  Patient presented with normal white count and no signs of sepsis. - Will continue with IV hydration. -Will send stool for culture -Will discuss with ID about potential use of antibiotics or further testing.(Secure chat sent to Dr. Dennise)  Hypokalemia: Replace potassium    DVT prophylaxis: lovenox   Code Status: full  Family Communication: daughter/spouse at bedside  Disposition Plan: home  Consults called:  Admission status: obs    Severity of Illness: The appropriate patient status for this patient is OBSERVATION. Observation status is judged to be reasonable and necessary in order to provide the required intensity of service to ensure the patient's safety. The patient's presenting symptoms, physical exam findings, and initial radiographic and laboratory data in the context of their medical condition is felt to place them at decreased risk for further clinical deterioration. Furthermore, it is anticipated that the patient will be  medically stable for discharge from the hospital within 2 midnights of admission.    Subjective:  Patient continues to have some loose stool.  Denies abdominal pain.  Appetite is still poor.  Overall feels better than yesterday.  No fever or chills.  No white count.  Objective: Vitals:   02/02/24 1701 02/02/24 1949 02/03/24 0000 02/03/24 0500  BP:  (!) 106/57 (!) 91/52 (!) 108/55  Pulse:  77 70 82  Resp:  20 18 18   Temp:  100.1 F (37.8 C) 98.6 F (37 C) 98.7 F (37.1 C)  TempSrc:  Oral Oral Oral  SpO2:  92% 92% 95%  Height: 5' 6 (1.676 m)       Intake/Output Summary (Last 24 hours) at 02/03/2024 1135 Last data filed at 02/03/2024 0900 Gross per 24 hour  Intake 1575.49 ml  Output --  Net 1575.49 ml   There were no vitals filed for this visit.  Examination:  General exam: Appears calm and comfortable  Respiratory system: Bilateral decreased breath sounds at bases Cardiovascular system: S1 & S2 heard, Rate controlled Gastrointestinal system: Abdomen is nondistended, soft and nontender. Normal bowel sounds heard. Extremities: No cyanosis, clubbing, edema  Central nervous system: Alert and oriented. No focal neurological deficits. Moving extremities Skin: No rashes, lesions or ulcers Psychiatry: Judgement and insight appear normal. Mood & affect appropriate.     Data Reviewed: I have personally reviewed following labs and imaging studies  CBC: Recent Labs  Lab 02/02/24 1048 02/03/24 0436  WBC 9.1 6.0  NEUTROABS 7.9*  --   HGB 12.1 11.3*  HCT 37.1 34.8*  MCV  92.3 91.8  PLT 218 208   Basic Metabolic Panel: Recent Labs  Lab 02/02/24 1048 02/03/24 0436  NA 140 138  K 3.7 3.5  CL 104 107  CO2 21* 20*  GLUCOSE 123* 107*  BUN 12 10  CREATININE 0.93 0.72  CALCIUM  8.6* 8.3*  MG 1.9  --    GFR: Estimated Creatinine Clearance: 53.1 mL/min (by C-G formula based on SCr of 0.72 mg/dL). Liver Function Tests: Recent Labs  Lab 02/02/24 1048  AST 36  ALT 25   ALKPHOS 71  BILITOT 0.3  PROT 6.5  ALBUMIN 3.7   Recent Labs  Lab 02/02/24 1048  LIPASE 34   No results for input(s): AMMONIA in the last 168 hours. Coagulation Profile: No results for input(s): INR, PROTIME in the last 168 hours. Cardiac Enzymes: No results for input(s): CKTOTAL, CKMB, CKMBINDEX, TROPONINI in the last 168 hours. BNP (last 3 results) No results for input(s): PROBNP in the last 8760 hours. HbA1C: No results for input(s): HGBA1C in the last 72 hours. CBG: No results for input(s): GLUCAP in the last 168 hours. Lipid Profile: No results for input(s): CHOL, HDL, LDLCALC, TRIG, CHOLHDL, LDLDIRECT in the last 72 hours. Thyroid  Function Tests: No results for input(s): TSH, T4TOTAL, FREET4, T3FREE, THYROIDAB in the last 72 hours. Anemia Panel: No results for input(s): VITAMINB12, FOLATE, FERRITIN, TIBC, IRON, RETICCTPCT in the last 72 hours. Sepsis Labs: No results for input(s): PROCALCITON, LATICACIDVEN in the last 168 hours.  Recent Results (from the past 240 hours)  Urine Culture     Status: None   Collection Time: 02/01/24 10:40 AM  Result Value Ref Range Status   MICRO NUMBER: [id number]  Final   SPECIMEN QUALITY: Adequate  Final   Sample Source URINE  Final   STATUS: FINAL  Final   Result: No Growth  Final  Gastrointestinal Panel by PCR , Stool     Status: Abnormal   Collection Time: 02/02/24  1:29 PM   Specimen: Stool  Result Value Ref Range Status   Campylobacter species NOT DETECTED NOT DETECTED Final   Plesimonas shigelloides NOT DETECTED NOT DETECTED Final   Salmonella species DETECTED (A) NOT DETECTED Final    Comment: CRITICAL RESULT CALLED TO, READ BACK BY AND VERIFIED WITH: ALLY B., RN AT 0945 02/03/24 RAM     Yersinia enterocolitica NOT DETECTED NOT DETECTED Final   Vibrio species NOT DETECTED NOT DETECTED Final   Vibrio cholerae NOT DETECTED NOT DETECTED Final   Enteroaggregative E  coli (EAEC) NOT DETECTED NOT DETECTED Final   Enteropathogenic E coli (EPEC) NOT DETECTED NOT DETECTED Final   Enterotoxigenic E coli (ETEC) NOT DETECTED NOT DETECTED Final   Shiga like toxin producing E coli (STEC) NOT DETECTED NOT DETECTED Final   Shigella/Enteroinvasive E coli (EIEC) NOT DETECTED NOT DETECTED Final   Cryptosporidium NOT DETECTED NOT DETECTED Final   Cyclospora cayetanensis NOT DETECTED NOT DETECTED Final   Entamoeba histolytica NOT DETECTED NOT DETECTED Final   Giardia lamblia NOT DETECTED NOT DETECTED Final   Adenovirus F40/41 NOT DETECTED NOT DETECTED Final   Astrovirus NOT DETECTED NOT DETECTED Final   Norovirus GI/GII NOT DETECTED NOT DETECTED Final   Rotavirus A NOT DETECTED NOT DETECTED Final   Sapovirus (I, II, IV, and V) NOT DETECTED NOT DETECTED Final    Comment: Performed at [facility], [address], [place], [place] [address]  C Difficile Quick Screen w PCR reflex     Status: None   Collection  Time: 02/02/24  1:29 PM   Specimen: Stool  Result Value Ref Range Status   C Diff antigen NEGATIVE NEGATIVE Final   C Diff toxin NEGATIVE NEGATIVE Final   C Diff interpretation No C. difficile detected.  Final    Comment: Performed at [facility], [address], [place], [place] [address]         Radiology Studies: CT ABDOMEN PELVIS W CONTRAST Result Date: 02/02/2024 EXAM: CT ABDOMEN AND PELVIS WITH CONTRAST 02/02/2024 12:31:24 PM TECHNIQUE: CT of the abdomen and pelvis was performed with the administration of intravenous contrast. Multiplanar reformatted images are provided for review. Automated exposure control, iterative reconstruction, and/or weight-based adjustment of the mA/kV was utilized to reduce the radiation dose to as low as reasonably achievable. 100 mL (iohexol  (OMNIPAQUE ) 300 MG/ML solution 100 mL IOHEXOL  300 MG/ML SOLN) was administered. COMPARISON: None available. CLINICAL HISTORY: Generalized abdominal pain, diarrhea. FINDINGS: LOWER  CHEST: Posterior bibasilar dependent atelectasis. Subsegmental atelectasis in the lingula and left lower lobe. LIVER: The liver is unremarkable. GALLBLADDER AND BILE DUCTS: Gallbladder is unremarkable. No biliary ductal dilatation. SPLEEN: No acute abnormality. PANCREAS: No acute abnormality. ADRENAL GLANDS: No acute abnormality. KIDNEYS, URETERS AND BLADDER: Fluid filled extrarenal pelves without hydronephrosis. Cortically based and peripelvic cysts noted in the left kidney. Per consensus, no follow-up is needed for simple Bosniak type 1 and 2 renal cysts, unless the patient has a malignancy history or risk factors. No stones in the kidneys or ureters. No perinephric or periureteral stranding. Urinary bladder is unremarkable. GI AND BOWEL: Partially visualized large hiatal hernia containing the majority of the stomach. Mild wall thickening with mucosal enhancement throughout the descending and sigmoid colon and including the rectum. Sigmoid diverticulosis. 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 There is no bowel obstruction. PERITONEUM AND RETROPERITONEUM: No ascites. No free air. VASCULATURE: Aorta is normal in caliber. Scattered aortic atherosclerosis. LYMPH NODES: No lymphadenopathy. REPRODUCTIVE ORGANS: Age related atrophy of the uterus and ovaries. No free pelvic fluid. BONES AND SOFT TISSUES: Osteopenia. 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 Facet arthropathy throughout the lumbar spine. Mild, grade 1 anterolisthesis of L4 on L5. No focal soft tissue abnormality. IMPRESSION: 1. Mild wall thickening with mucosal enhancement throughout the descending and sigmoid colon and including the rectum, consistent with an infectious or inflammatory proctocolitis. 2. Sigmoid diverticulosis. No changes of acute diverticulitis. Electronically signed by: Rogelia Myers MD 02/02/2024 01:15 PM EST RP Workstation: [id number]        Scheduled Meds:  enoxaparin  (LOVENOX ) injection  40 mg Subcutaneous Q24H   potassium chloride  40 mEq Oral BID   Continuous Infusions:  dextrose  5 % and 0.45 % NaCl with KCl 20 mEq/L 75 mL/hr at 02/03/24 0715          Derryl Duval, MD [facility] 02/03/2024, 11:35 AM   "

## 2024-02-03 NOTE — Assessment & Plan Note (Signed)
 She is symptomatic. We discussed possible causes, Also to consider lumbar radiculopathy. In the past she tried gabapentin , discontinued because of drowsiness. We discussed treatment options, including duloxetine and Lyrica , she agrees with trying the latter one. Lyrica  25 mg at bedtime started today. Continue appropriate footcare. We discussed some side effects of medications. Follow-up in 4 weeks, before if needed.

## 2024-02-03 NOTE — TOC CM/SW Note (Signed)
 Transition of Care [facility]) - Inpatient Brief Assessment   Patient Details  Name: Tanya Swanson MRN: [id number] Date of Birth: 1940-11-18  Transition of Care [facility]) CM/SW Contact:    Lucie Lunger, LCSWA Phone Number: 02/03/2024, 8:48 AM   Clinical Narrative: Transition of Care Department [facility]) has reviewed patient and no TOC needs have been identified at this time. We will continue to monitor patient advancement through interdiciplinary progression rounds. If new patient transition needs arise, please place a TOC consult.  Transition of Care Asessment: Insurance and Status: Insurance coverage has been reviewed Patient has primary care physician: Yes Home environment has been reviewed: From home Prior level of function:: Independent Prior/Current Home Services: No current home services Social Drivers of Health Review: SDOH reviewed no interventions necessary Readmission risk has been reviewed: Yes Transition of care needs: no transition of care needs at this time

## 2024-02-04 DIAGNOSIS — K529 Noninfective gastroenteritis and colitis, unspecified: Secondary | ICD-10-CM | POA: Diagnosis not present

## 2024-02-04 LAB — URINALYSIS, ROUTINE W REFLEX MICROSCOPIC
Bilirubin Urine: NEGATIVE
Glucose, UA: NEGATIVE mg/dL
Ketones, ur: NEGATIVE mg/dL
Nitrite: NEGATIVE
Protein, ur: NEGATIVE mg/dL
Specific Gravity, Urine: 1.005 (ref 1.005–1.030)
pH: 6 (ref 5.0–8.0)

## 2024-02-04 LAB — BASIC METABOLIC PANEL WITH GFR
Anion gap: 12 (ref 5–15)
BUN: 5 mg/dL — ABNORMAL LOW (ref 8–23)
CO2: 18 mmol/L — ABNORMAL LOW (ref 22–32)
Calcium: 8.7 mg/dL — ABNORMAL LOW (ref 8.9–10.3)
Chloride: 109 mmol/L (ref 98–111)
Creatinine, Ser: 0.69 mg/dL (ref 0.44–1.00)
GFR, Estimated: >60 mL/min
Glucose, Bld: 102 mg/dL — ABNORMAL HIGH (ref 70–99)
Potassium: 3.8 mmol/L (ref 3.5–5.1)
Sodium: 139 mmol/L (ref 135–145)

## 2024-02-04 MED ORDER — SODIUM CHLORIDE 0.9 % IV SOLN: 1.0000 g | INTRAVENOUS | Status: DC

## 2024-02-04 NOTE — Plan of Care (Signed)
  Problem: Education: Goal: Knowledge of General Education information will improve Description: Including pain rating scale, medication(s)/side effects and non-pharmacologic comfort measures Outcome: Progressing   Problem: Health Behavior/Discharge Planning: Goal: Ability to manage health-related needs will improve Outcome: Progressing   Problem: Clinical Measurements: Goal: Ability to maintain clinical measurements within normal limits will improve Outcome: Progressing Goal: Will remain free from infection Outcome: Progressing   Problem: Pain Managment: Goal: General experience of comfort will improve and/or be controlled Outcome: Progressing

## 2024-02-04 NOTE — Plan of Care (Signed)

## 2024-02-04 NOTE — Progress Notes (Signed)
 " PROGRESS NOTE    Tanya Swanson  FMW:[id number] DOB: 03/12/1940 DOA: 02/02/2024 PCP: Jordan, Betty G, MD   Brief Narrative:   Tanya Swanson is a 84 y.o. female with medical history significant of acid reflux, CKD, hiatal hernia and UTIs who presents to the ER with complaints of nausea and loose stool f for 2 days prior to presentation (onset ~1/20).  Patient reports some abdominal discomfort and poor appetite. Blood pressure was initially soft in the ER improved with IV fluid bolus.  CT appeared grossly dehydrated.  Labs look surprisingly okay.  CT of abdomen and pelvis revealed colitis on the left side with no abscess or other complications.  CT PCR was negative.  Patient admitted for further management.   Assessment & Plan:   Principal Problem:   Colitis   Diarrhea/colitis: -CT shows colitis on the left side.  No history of Crohn's disease or ulcerative colitis. - C. difficile PCR is negative -Stool PCR is positive for Salmonella species.  Patient presented with normal white count and no signs of sepsis. - Stool culture pending -Patient has had few loose stools but seems to be doing better.  Wants to eat more. -Will advance to soft diet -Continue IV hydration - Discussed with ID, antibiotics not warranted unless severe or immunocompromise.  Patient doing better.  Will continue hydration, no antibiotics at this time. - If oral intake better with better hydration, consider discharge Sunday  Hypokalemia: Replace potassium  Pyuria: UA reveals leukocyte esterase.  Patient does not have any urinary symptoms.  Will hold off on antibiotics    DVT prophylaxis: lovenox   Code Status: full  Family Communication: daughter at bedside  Disposition Plan: home  Consults called:  Admission status: obs    Severity of Illness: The appropriate patient status for this patient is OBSERVATION. Observation status is judged to be reasonable and necessary in order to provide the  required intensity of service to ensure the patient's safety. The patient's presenting symptoms, physical exam findings, and initial radiographic and laboratory data in the context of their medical condition is felt to place them at decreased risk for further clinical deterioration. Furthermore, it is anticipated that the patient will be medically stable for discharge from the hospital within 2 midnights of admission.    Subjective:  Patient continues to have some loose stool.  Denies abdominal pain, episodes of diarrhea have decreased.  Vital signs stable.  No fever or chills Appetite somewhat improving. Labs with some acidosis.  Objective: Vitals:   02/03/24 1832 02/03/24 1900 02/03/24 1902 02/04/24 0300  BP:  112/66  (!) 106/56  Pulse:   77 67  Resp:   18 18  Temp: 99.1 F (37.3 C) 98.3 F (36.8 C)  98 F (36.7 C)  TempSrc: Oral  Oral Oral  SpO2:   92% 94%  Height:       No intake or output data in the 24 hours ending 02/04/24 1549  There were no vitals filed for this visit.  Examination:  General exam: Appears calm and comfortable  Respiratory system: Bilateral decreased breath sounds at bases Cardiovascular system: S1 & S2 heard, Rate controlled Gastrointestinal system: Abdomen is nondistended, soft and nontender. Normal bowel sounds heard. Extremities: No cyanosis, clubbing, edema  Central nervous system: Alert and oriented. No focal neurological deficits. Moving extremities Skin: No rashes, lesions or ulcers Psychiatry: Judgement and insight appear normal. Mood & affect appropriate.     Data Reviewed: I have personally reviewed following labs  and imaging studies  CBC: Recent Labs  Lab 02/02/24 1048 02/03/24 0436  WBC 9.1 6.0  NEUTROABS 7.9*  --   HGB 12.1 11.3*  HCT 37.1 34.8*  MCV 92.3 91.8  PLT 218 208   Basic Metabolic Panel: Recent Labs  Lab 02/02/24 1048 02/03/24 0436 02/04/24 0423  NA 140 138 139  K 3.7 3.5 3.8  CL 104 107 109  CO2 21* 20*  18*  GLUCOSE 123* 107* 102*  BUN 12 10 5*  CREATININE 0.93 0.72 0.69  CALCIUM  8.6* 8.3* 8.7*  MG 1.9  --   --    GFR: Estimated Creatinine Clearance: 53.1 mL/min (by C-G formula based on SCr of 0.69 mg/dL). Liver Function Tests: Recent Labs  Lab 02/02/24 1048  AST 36  ALT 25  ALKPHOS 71  BILITOT 0.3  PROT 6.5  ALBUMIN 3.7   Recent Labs  Lab 02/02/24 1048  LIPASE 34   No results for input(s): AMMONIA in the last 168 hours. Coagulation Profile: No results for input(s): INR, PROTIME in the last 168 hours. Cardiac Enzymes: No results for input(s): CKTOTAL, CKMB, CKMBINDEX, TROPONINI in the last 168 hours. BNP (last 3 results) No results for input(s): PROBNP in the last 8760 hours. HbA1C: No results for input(s): HGBA1C in the last 72 hours. CBG: No results for input(s): GLUCAP in the last 168 hours. Lipid Profile: No results for input(s): CHOL, HDL, LDLCALC, TRIG, CHOLHDL, LDLDIRECT in the last 72 hours. Thyroid  Function Tests: No results for input(s): TSH, T4TOTAL, FREET4, T3FREE, THYROIDAB in the last 72 hours. Anemia Panel: No results for input(s): VITAMINB12, FOLATE, FERRITIN, TIBC, IRON, RETICCTPCT in the last 72 hours. Sepsis Labs: No results for input(s): PROCALCITON, LATICACIDVEN in the last 168 hours.  Recent Results (from the past 240 hours)  Urine Culture     Status: None   Collection Time: 02/01/24 10:40 AM  Result Value Ref Range Status   MICRO NUMBER: [id number]  Final   SPECIMEN QUALITY: Adequate  Final   Sample Source URINE  Final   STATUS: FINAL  Final   Result: No Growth  Final  Gastrointestinal Panel by PCR , Stool     Status: Abnormal   Collection Time: 02/02/24  1:29 PM   Specimen: Stool  Result Value Ref Range Status   Campylobacter species NOT DETECTED NOT DETECTED Final   Plesimonas shigelloides NOT DETECTED NOT DETECTED Final   Salmonella species DETECTED (A) NOT DETECTED Final     Comment: CRITICAL RESULT CALLED TO, READ BACK BY AND VERIFIED WITH: ALLY B., RN AT 0945 02/03/24 RAM     Yersinia enterocolitica NOT DETECTED NOT DETECTED Final   Vibrio species NOT DETECTED NOT DETECTED Final   Vibrio cholerae NOT DETECTED NOT DETECTED Final   Enteroaggregative E coli (EAEC) NOT DETECTED NOT DETECTED Final   Enteropathogenic E coli (EPEC) NOT DETECTED NOT DETECTED Final   Enterotoxigenic E coli (ETEC) NOT DETECTED NOT DETECTED Final   Shiga like toxin producing E coli (STEC) NOT DETECTED NOT DETECTED Final   Shigella/Enteroinvasive E coli (EIEC) NOT DETECTED NOT DETECTED Final   Cryptosporidium NOT DETECTED NOT DETECTED Final   Cyclospora cayetanensis NOT DETECTED NOT DETECTED Final   Entamoeba histolytica NOT DETECTED NOT DETECTED Final   Giardia lamblia NOT DETECTED NOT DETECTED Final   Adenovirus F40/41 NOT DETECTED NOT DETECTED Final   Astrovirus NOT DETECTED NOT DETECTED Final   Norovirus GI/GII NOT DETECTED NOT DETECTED Final   Rotavirus A NOT DETECTED NOT DETECTED Final  Sapovirus (I, II, IV, and V) NOT DETECTED NOT DETECTED Final    Comment: Performed at [facility], [address] [place], [place] [address]  C Difficile Quick Screen w PCR reflex     Status: None   Collection Time: 02/02/24  1:29 PM   Specimen: Stool  Result Value Ref Range Status   C Diff antigen NEGATIVE NEGATIVE Final   C Diff toxin NEGATIVE NEGATIVE Final   C Diff interpretation No C. difficile detected.  Final    Comment: Performed at [facility], [address], [place], [place] [address]         Radiology Studies: No results found.       Scheduled Meds:  enoxaparin  (LOVENOX ) injection  40 mg Subcutaneous Q24H   Continuous Infusions:  dextrose  5 % and 0.45 % NaCl with KCl 20 mEq/L 75 mL/hr at 02/04/24 1208          Derryl Duval, MD [facility] 02/04/2024, 3:49 PM   "

## 2024-02-05 DIAGNOSIS — A029 Salmonella infection, unspecified: Secondary | ICD-10-CM | POA: Diagnosis present

## 2024-02-05 DIAGNOSIS — K529 Noninfective gastroenteritis and colitis, unspecified: Secondary | ICD-10-CM | POA: Diagnosis not present

## 2024-02-05 LAB — BASIC METABOLIC PANEL WITH GFR
Anion gap: 9 (ref 5–15)
BUN: 6 mg/dL — ABNORMAL LOW (ref 8–23)
CO2: 22 mmol/L (ref 22–32)
Calcium: 8.6 mg/dL — ABNORMAL LOW (ref 8.9–10.3)
Chloride: 109 mmol/L (ref 98–111)
Creatinine, Ser: 0.71 mg/dL (ref 0.44–1.00)
GFR, Estimated: >60 mL/min
Glucose, Bld: 102 mg/dL — ABNORMAL HIGH (ref 70–99)
Potassium: 3.9 mmol/L (ref 3.5–5.1)
Sodium: 141 mmol/L (ref 135–145)

## 2024-02-05 NOTE — Discharge Summary (Signed)
 Physician Discharge Summary  Tanya Swanson FMW:[id number] DOB: Aug 11, 1940 DOA: 02/02/2024  PCP: Jordan, Betty G, MD  Admit date: 02/02/2024 Discharge date: 02/05/2024  Admitted From: Home Disposition: Home  Recommendations for Outpatient Follow-up:  Follow up with PCP in 1 week  Follow up in ED if symptoms worsen or new appear   Home Health: No Equipment/Devices: None  Discharge Condition: Stable CODE STATUS: Full Diet recommendation: Heart healthy  Brief/Interim Summary:  Tanya Swanson is a 84 y.o. female with medical history significant of acid reflux, CKD, hiatal hernia and UTIs who presents to the ER with complaints of nausea and loose stool f for 2 days prior to presentation (onset ~1/20).  Patient reports some abdominal discomfort and poor appetite. Blood pressure was initially soft in the ER improved with IV fluid bolus.  She appeared grossly dehydrated. .  CT of abdomen and pelvis revealed colitis on the left side with no abscess or other complications.    Patient admitted for further management.  Stool PCR was negative for C. difficile.  This was positive for Salmonella species.  She was not septic and given nonsevere illness, antibiotics were not given.  She received continuous IV fluid with improvement in hydration.  She continued to feel better and was discharged to home.  She was able to tolerate diet and her diarrhea resolved prior to discharge.  Of note, UA was positive for pyuria but this was also not treated due to lack of urinary symptoms.   Discharge Diagnoses:  Principal Problem:   Colitis Active Problems:   Salmonella infection   GERD (gastroesophageal reflux disease)   Chronic lower back pain   Peripheral neuropathy    Discharge Instructions  Discharge Instructions     Discharge instructions   Complete by: As directed    1.  Stay hydrated.  Follow-up with PCP within a week.  Return to the ER if worsening diarrhea, worsening abdominal  pain nausea or vomiting   Increase activity slowly   Complete by: As directed       Allergies as of 02/05/2024       Reactions   Penicillins Other (See Comments)   Has patient had a PCN reaction causing immediate rash, facial/tongue/throat swelling, SOB or lightheadedness with hypotension: Yes Has patient had a PCN reaction causing severe rash involving mucus membranes or skin necrosis: No Has patient had a PCN reaction that required hospitalization No Has patient had a PCN reaction occurring within the last 10 years: No If all of the above answers are NO, then may proceed with Cephalosporin use. Passed out Has patient had a PCN reaction causing immediate rash, facial/tongue/throat swelling, SOB or lightheadedness with hypotension: Yes Has patient had a PCN reaction causing severe rash involving mucus membranes or skin necrosis: No Has patient had a PCN reaction that required hospitalization No Has patient had a PCN reaction occurring within the last 10 years: No If all of the above answers are NO, then may proceed with Cephalosporin use. Passed out fainted Has patient had a PCN reaction causing immediate rash, facial/tongue/throat swelling, SOB or lightheadedness with hypotension: Yes Has patient had a PCN reaction causing severe rash involving mucus membranes or skin necrosis: No Has patient had a PCN reaction that required hospitalization No Has patient had a PCN reaction occurring within the last 10 years: No If all of the above answers are NO, then may proceed with Cephalosporin use. Passed out fainted Has patient had a PCN reaction causing immediate rash, facial/tongue/throat  swelling, SOB or lightheadedness with hypotension: Yes Has patient had a PCN reaction causing severe rash involving mucus membranes or skin necrosis: No Has patient had a PCN reaction that required hospitalization No Has patient had a PCN reaction occurring within the last 10 years: No If all of the  above answers are NO, then may proceed with Cephalosporin use. Passed out Has patient had a PCN reaction causing immediate rash, facial/tongue/throat swelling, SOB or lightheadedness with hypotension: Yes Has patient had a PCN reaction causing severe rash involving mucus membranes or skin necrosis: No Has patient had a PCN reaction that required hospitalization No Has patient had a PCN reaction occurring within the last 10 years: No If all of the above answers are NO, then may proceed with Cephalosporin use. Passed out fainted        Medication List     TAKE these medications    Biotin 5 MG Tabs Take by mouth.   Calcium -Vitamin D -Vitamin K 500-100-40 MG-UNT-MCG Chew Chew by mouth.   cycloSPORINE 0.05 % ophthalmic emulsion Commonly known as: RESTASIS Place 1 drop into both eyes 2 (two) times daily.   OVER THE COUNTER MEDICATION Taking [facility] feet and nerves. Take 1 capsule in a.m and 1 p.m   PEPCID PO Take by mouth daily.   polyethylene glycol 17 g packet Commonly known as: MIRALAX / GLYCOLAX Take 17 g by mouth daily as needed. For constipation.   pregabalin  25 MG capsule Commonly known as: Lyrica  Take 1 capsule (25 mg total) by mouth at bedtime.   PROBIOTIC DAILY PO Take by mouth daily.   TYLENOL  PO Take 500 mg by mouth as needed.   Vitamin D3 50 MCG (2000 UT) capsule Take by mouth.        Follow-up Information     Jordan, Betty G, MD. Schedule an appointment as soon as possible for a visit in 1 week(s).   Specialty: Family Medicine Contact information: [address] [place] [address] [phone]                Allergies[1]  Consultations:    Procedures/Studies: CT ABDOMEN PELVIS W CONTRAST Result Date: 02/02/2024 EXAM: CT ABDOMEN AND PELVIS WITH CONTRAST 02/02/2024 12:31:24 PM TECHNIQUE: CT of the abdomen and pelvis was performed with the administration of intravenous contrast. Multiplanar reformatted  images are provided for review. Automated exposure control, iterative reconstruction, and/or weight-based adjustment of the mA/kV was utilized to reduce the radiation dose to as low as reasonably achievable. 100 mL (iohexol  (OMNIPAQUE ) 300 MG/ML solution 100 mL IOHEXOL  300 MG/ML SOLN) was administered. COMPARISON: None available. CLINICAL HISTORY: Generalized abdominal pain, diarrhea. FINDINGS: LOWER CHEST: Posterior bibasilar dependent atelectasis. Subsegmental atelectasis in the lingula and left lower lobe. LIVER: The liver is unremarkable. GALLBLADDER AND BILE DUCTS: Gallbladder is unremarkable. No biliary ductal dilatation. SPLEEN: No acute abnormality. PANCREAS: No acute abnormality. ADRENAL GLANDS: No acute abnormality. KIDNEYS, URETERS AND BLADDER: Fluid filled extrarenal pelves without hydronephrosis. Cortically based and peripelvic cysts noted in the left kidney. Per consensus, no follow-up is needed for simple Bosniak type 1 and 2 renal cysts, unless the patient has a malignancy history or risk factors. No stones in the kidneys or ureters. No perinephric or periureteral stranding. Urinary bladder is unremarkable. GI AND BOWEL: Partially visualized large hiatal hernia containing the majority of the stomach. Mild wall thickening with mucosal enhancement throughout the descending and sigmoid colon and including the rectum. Sigmoid diverticulosis. 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 There is no bowel obstruction. PERITONEUM AND RETROPERITONEUM: No ascites. No free air. VASCULATURE: Aorta is normal in caliber. Scattered aortic atherosclerosis. LYMPH NODES: No lymphadenopathy. REPRODUCTIVE ORGANS: Age related atrophy of the uterus and ovaries. No free pelvic fluid. BONES AND SOFT TISSUES: Osteopenia. 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 Facet  arthropathy throughout the lumbar spine. Mild, grade 1 anterolisthesis of L4 on L5. No focal soft tissue abnormality. IMPRESSION: 1. Mild wall thickening with mucosal enhancement throughout the descending and sigmoid colon and including the rectum, consistent with an infectious or inflammatory proctocolitis. 2. Sigmoid diverticulosis. No changes of acute diverticulitis. Electronically signed by: Rogelia Myers MD 02/02/2024 01:15 PM EST RP Workstation: [id number]      Subjective:   Discharge Exam: Vitals:   02/04/24 1929 02/05/24 0435  BP: 129/79 117/68  Pulse: 74 67  Resp: 18 20  Temp: 97.8 F (36.6 C) 98.1 F (36.7 C)  SpO2: 95% 94%    General: Pt is alert, awake, not in acute distress Cardiovascular: rate controlled, S1/S2 + Respiratory: bilateral decreased breath sounds at bases Abdominal: Soft, NT, ND, bowel sounds + Extremities: no edema, no cyanosis    The results of significant diagnostics from this hospitalization (including imaging, microbiology, ancillary and laboratory) are listed below for reference.     Microbiology: Recent Results (from the past 240 hours)  Urine Culture     Status: None   Collection Time: 02/01/24 10:40 AM  Result Value Ref Range Status   MICRO NUMBER: [id number]  Final   SPECIMEN QUALITY: Adequate  Final   Sample Source URINE  Final   STATUS: FINAL  Final   Result: No Growth  Final  Gastrointestinal Panel by PCR , Stool     Status: Abnormal   Collection Time: 02/02/24  1:29 PM   Specimen: Stool  Result Value Ref Range Status   Campylobacter species NOT DETECTED NOT DETECTED Final   Plesimonas shigelloides NOT DETECTED NOT DETECTED Final   Salmonella species DETECTED (A) NOT DETECTED Final    Comment: CRITICAL RESULT CALLED TO, READ BACK BY AND VERIFIED WITH: ALLY B., RN AT 0945 02/03/24 RAM     Yersinia enterocolitica NOT DETECTED NOT DETECTED Final   Vibrio species NOT DETECTED NOT DETECTED Final   Vibrio cholerae NOT DETECTED NOT  DETECTED Final   Enteroaggregative E coli (EAEC) NOT DETECTED NOT DETECTED Final   Enteropathogenic E coli (EPEC) NOT DETECTED NOT DETECTED Final   Enterotoxigenic E coli (ETEC) NOT DETECTED NOT DETECTED Final   Shiga like toxin producing E coli (STEC) NOT DETECTED NOT DETECTED Final   Shigella/Enteroinvasive E coli (EIEC) NOT DETECTED NOT DETECTED Final   Cryptosporidium NOT DETECTED NOT DETECTED Final   Cyclospora cayetanensis NOT DETECTED NOT DETECTED Final   Entamoeba histolytica NOT DETECTED NOT DETECTED Final   Giardia lamblia NOT DETECTED NOT DETECTED Final   Adenovirus F40/41 NOT DETECTED NOT DETECTED Final   Astrovirus NOT DETECTED NOT DETECTED Final   Norovirus GI/GII NOT DETECTED NOT DETECTED Final   Rotavirus A NOT DETECTED NOT DETECTED Final   Sapovirus (I, II, IV, and V) NOT DETECTED NOT DETECTED Final    Comment: Performed at [facility], [address], [place], [place] [address]  C Difficile Quick Screen w PCR reflex     Status: None   Collection Time: 02/02/24  1:29 PM  Specimen: Stool  Result Value Ref Range Status   C Diff antigen NEGATIVE NEGATIVE Final   C Diff toxin NEGATIVE NEGATIVE Final   C Diff interpretation No C. difficile detected.  Final    Comment: Performed at [facility], [address] [place], [place] [address]     Labs: BNP (last 3 results) No results for input(s): BNP in the last 8760 hours. Basic Metabolic Panel: Recent Labs  Lab 02/02/24 1048 02/03/24 0436 02/04/24 0423 02/05/24 0433  NA 140 138 139 141  K 3.7 3.5 3.8 3.9  CL 104 107 109 109  CO2 21* 20* 18* 22  GLUCOSE 123* 107* 102* 102*  BUN 12 10 5* 6*  CREATININE 0.93 0.72 0.69 0.71  CALCIUM  8.6* 8.3* 8.7* 8.6*  MG 1.9  --   --   --    Liver Function Tests: Recent Labs  Lab 02/02/24 1048  AST 36  ALT 25  ALKPHOS 71  BILITOT 0.3  PROT 6.5  ALBUMIN 3.7   Recent Labs  Lab 02/02/24 1048  LIPASE 34   No results for input(s): AMMONIA in the last 168  hours. CBC: Recent Labs  Lab 02/02/24 1048 02/03/24 0436  WBC 9.1 6.0  NEUTROABS 7.9*  --   HGB 12.1 11.3*  HCT 37.1 34.8*  MCV 92.3 91.8  PLT 218 208   Cardiac Enzymes: No results for input(s): CKTOTAL, CKMB, CKMBINDEX, TROPONINI in the last 168 hours. BNP: Invalid input(s): POCBNP CBG: No results for input(s): GLUCAP in the last 168 hours. D-Dimer No results for input(s): DDIMER in the last 72 hours. Hgb A1c No results for input(s): HGBA1C in the last 72 hours. Lipid Profile No results for input(s): CHOL, HDL, LDLCALC, TRIG, CHOLHDL, LDLDIRECT in the last 72 hours. Thyroid  function studies No results for input(s): TSH, T4TOTAL, T3FREE, THYROIDAB in the last 72 hours.  Invalid input(s): FREET3 Anemia work up No results for input(s): VITAMINB12, FOLATE, FERRITIN, TIBC, IRON, RETICCTPCT in the last 72 hours. Urinalysis    Component Value Date/Time   COLORURINE YELLOW 02/04/2024 0422   APPEARANCEUR HAZY (A) 02/04/2024 0422   LABSPEC 1.005 02/04/2024 0422   PHURINE 6.0 02/04/2024 0422   GLUCOSEU NEGATIVE 02/04/2024 0422   GLUCOSEU NEGATIVE 09/23/2020 0941   HGBUR MODERATE (A) 02/04/2024 0422   BILIRUBINUR NEGATIVE 02/04/2024 0422   BILIRUBINUR neg 08/23/2022 1002   KETONESUR NEGATIVE 02/04/2024 0422   PROTEINUR NEGATIVE 02/04/2024 0422   UROBILINOGEN 0.2 08/23/2022 1002   UROBILINOGEN 0.2 09/23/2020 0941   NITRITE NEGATIVE 02/04/2024 0422   LEUKOCYTESUR LARGE (A) 02/04/2024 0422   Sepsis Labs Recent Labs  Lab 02/02/24 1048 02/03/24 0436  WBC 9.1 6.0   Microbiology Recent Results (from the past 240 hours)  Urine Culture     Status: None   Collection Time: 02/01/24 10:40 AM  Result Value Ref Range Status   MICRO NUMBER: [id number]  Final   SPECIMEN QUALITY: Adequate  Final   Sample Source URINE  Final   STATUS: FINAL  Final   Result: No Growth  Final  Gastrointestinal Panel by PCR , Stool     Status:  Abnormal   Collection Time: 02/02/24  1:29 PM   Specimen: Stool  Result Value Ref Range Status   Campylobacter species NOT DETECTED NOT DETECTED Final   Plesimonas shigelloides NOT DETECTED NOT DETECTED Final   Salmonella species DETECTED (A) NOT DETECTED Final    Comment: CRITICAL RESULT CALLED TO, READ BACK BY AND VERIFIED WITH: ALLY B., RN  AT 0945 02/03/24 RAM     Yersinia enterocolitica NOT DETECTED NOT DETECTED Final   Vibrio species NOT DETECTED NOT DETECTED Final   Vibrio cholerae NOT DETECTED NOT DETECTED Final   Enteroaggregative E coli (EAEC) NOT DETECTED NOT DETECTED Final   Enteropathogenic E coli (EPEC) NOT DETECTED NOT DETECTED Final   Enterotoxigenic E coli (ETEC) NOT DETECTED NOT DETECTED Final   Shiga like toxin producing E coli (STEC) NOT DETECTED NOT DETECTED Final   Shigella/Enteroinvasive E coli (EIEC) NOT DETECTED NOT DETECTED Final   Cryptosporidium NOT DETECTED NOT DETECTED Final   Cyclospora cayetanensis NOT DETECTED NOT DETECTED Final   Entamoeba histolytica NOT DETECTED NOT DETECTED Final   Giardia lamblia NOT DETECTED NOT DETECTED Final   Adenovirus F40/41 NOT DETECTED NOT DETECTED Final   Astrovirus NOT DETECTED NOT DETECTED Final   Norovirus GI/GII NOT DETECTED NOT DETECTED Final   Rotavirus A NOT DETECTED NOT DETECTED Final   Sapovirus (I, II, IV, and V) NOT DETECTED NOT DETECTED Final    Comment: Performed at [facility], [address] [place], [place] [address]  C Difficile Quick Screen w PCR reflex     Status: None   Collection Time: 02/02/24  1:29 PM   Specimen: Stool  Result Value Ref Range Status   C Diff antigen NEGATIVE NEGATIVE Final   C Diff toxin NEGATIVE NEGATIVE Final   C Diff interpretation No C. difficile detected.  Final    Comment: Performed at [facility], [address] [place], [place] [address]     Time coordinating discharge: 35 minutes  SIGNED:   Derryl Duval, MD  [facility] 02/05/2024, 8:37 PM       [1]  Allergies Allergen Reactions   Penicillins Other (See Comments)    Has patient had a PCN reaction causing immediate rash, facial/tongue/throat swelling, SOB or lightheadedness with hypotension: Yes Has patient had a PCN reaction causing severe rash involving mucus membranes or skin necrosis: No Has patient had a PCN reaction that required hospitalization No Has patient had a PCN reaction occurring within the last 10 years: No If all of the above answers are NO, then may proceed with Cephalosporin use. Passed out Has patient had a PCN reaction causing immediate rash, facial/tongue/throat swelling, SOB or lightheadedness with hypotension: Yes Has patient had a PCN reaction causing severe rash involving mucus membranes or skin necrosis: No Has patient had a PCN reaction that required hospitalization No Has patient had a PCN reaction occurring within the last 10 years: No If all of the above answers are NO, then may proceed with Cephalosporin use. Passed out fainted Has patient had a PCN reaction causing immediate rash, facial/tongue/throat swelling, SOB or lightheadedness with hypotension: Yes Has patient had a PCN reaction causing severe rash involving mucus membranes or skin necrosis: No Has patient had a PCN reaction that required hospitalization No Has patient had a PCN reaction occurring within the last 10 years: No If all of the above answers are NO, then may proceed with Cephalosporin use. Passed out fainted Has patient had a PCN reaction causing immediate rash, facial/tongue/throat swelling, SOB or lightheadedness with hypotension: Yes Has patient had a PCN reaction causing severe rash involving mucus membranes or skin necrosis: No Has patient had a PCN reaction that required hospitalization No Has patient had a PCN reaction occurring within the last 10 years: No If all of the above answers are NO, then may proceed with Cephalosporin use. Passed out Has patient  had  a PCN reaction causing immediate rash, facial/tongue/throat swelling, SOB or lightheadedness with hypotension: Yes Has patient had a PCN reaction causing severe rash involving mucus membranes or skin necrosis: No Has patient had a PCN reaction that required hospitalization No Has patient had a PCN reaction occurring within the last 10 years: No If all of the above answers are NO, then may proceed with Cephalosporin use. Passed out fainted

## 2024-02-05 NOTE — Plan of Care (Signed)

## 2024-02-05 NOTE — Plan of Care (Signed)
   Problem: Education: Goal: Knowledge of General Education information will improve Description Including pain rating scale, medication(s)/side effects and non-pharmacologic comfort measures Outcome: Progressing   Problem: Health Behavior/Discharge Planning: Goal: Ability to manage health-related needs will improve Outcome: Progressing

## 2024-02-06 ENCOUNTER — Telehealth: Payer: Self-pay

## 2024-02-06 ENCOUNTER — Other Ambulatory Visit (HOSPITAL_COMMUNITY): Payer: Self-pay

## 2024-02-06 NOTE — Transitions of Care (Post Inpatient/ED Visit) (Signed)
" ° °  02/06/2024  Name: Tanya Swanson MRN: [id number] DOB: 1940-08-19  Today's TOC FU Call Status: Today's TOC FU Call Status:: Successful TOC FU Call Completed TOC FU Call Complete Date: 02/06/24  Patient's Name and Date of Birth confirmed. Name, DOB  Transition Care Management Follow-up Telephone Call Date of Discharge: 02/05/24 [facility] Type of Discharge: Inpatient Admission Primary Inpatient Discharge Diagnosis:: colitis How have you been since you were released from the hospital?: Better Any questions or concerns?: No  Items Reviewed: Did you receive and understand the discharge instructions provided?: Yes Medications obtained,verified, and reconciled?: Yes (Medications Reviewed) Any new allergies since your discharge?: No Dietary orders reviewed?: Yes Do you have support at home?: Yes People in Home [RPT]: spouse  Medications Reviewed Today: Medications Reviewed Today     Reviewed by Emmitt Pan, LPN (Licensed Practical Nurse) on 02/06/24 at 1612  Med List Status: <None>   Medication Order Taking? Sig Documenting Provider Last Dose Status Informant  Acetaminophen  (TYLENOL  PO) [id number] Yes Take 500 mg by mouth as needed. [provider]  Active Self  Biotin 5 MG TABS [id number] Yes Take by mouth. [provider]  Active   Calcium -Vitamin D -Vitamin K 500-100-40 MG-UNT-MCG CHEW [id number] Yes Chew by mouth. [provider]  Active   Cholecalciferol (VITAMIN D3) 50 MCG (2000 UT) capsule [id number] Yes Take by mouth. [provider]  Active   cycloSPORINE (RESTASIS) 0.05 % ophthalmic emulsion [id number] Yes Place 1 drop into both eyes 2 (two) times daily. [provider]  Active   denosumab  (PROLIA ) injection 60 mg [id number]   Jordan, Betty G, MD  Active   Famotidine (PEPCID PO) [id number] Yes Take by mouth daily. [provider]  Active Self  OVER THE COUNTER MEDICATION [id number] Yes Taking  [facility] feet and nerves. Take 1 capsule in a.m and 1 p.m [provider]  Active   polyethylene glycol (MIRALAX / GLYCOLAX) packet [id number] Yes Take 17 g by mouth daily as needed. For constipation. [provider]  Active Self  pregabalin  (LYRICA ) 25 MG capsule [id number] Yes Take 1 capsule (25 mg total) by mouth at bedtime. Jordan, Betty G, MD  Active   Probiotic Product (PROBIOTIC DAILY PO) [id number] Yes Take by mouth daily. [provider]  Active             Home Care and Equipment/Supplies: Were Home Health Services Ordered?: NA Any new equipment or medical supplies ordered?: NA  Functional Questionnaire: Do you need assistance with bathing/showering or dressing?: No Do you need assistance with meal preparation?: No Do you need assistance with eating?: No Do you have difficulty maintaining continence: No Do you need assistance with getting out of bed/getting out of a chair/moving?: No Do you have difficulty managing or taking your medications?: No  Follow up appointments reviewed: PCP Follow-up appointment confirmed?: Yes Date of PCP follow-up appointment?: 02/15/24 [facility] Follow-up appointment confirmed?: NA Do you need transportation to your follow-up appointment?: No Do you understand care options if your condition(s) worsen?: Yes-patient verbalized understanding    SIGNATURE Pan Emmitt, LPN [facility] Nurse Health Advisor Direct Dial [phone]  "

## 2024-02-10 ENCOUNTER — Other Ambulatory Visit (HOSPITAL_COMMUNITY): Payer: Self-pay

## 2024-02-10 NOTE — Telephone Encounter (Signed)
 Pharmacy Patient Advocate Encounter  Received notification from [facility] that Prior Authorization for Pregabalin  25 caps has been APPROVED from 02/02/24 to 01/10/25. Ran test claim, Copay is $2.16. This test claim was processed through [facility]- copay amounts may vary at other pharmacies due to pharmacy/plan contracts, or as the patient moves through the different stages of their insurance plan.   PA #/Case ID/Reference #: # [id number]

## 2024-02-14 NOTE — Telephone Encounter (Signed)
 Called patient and she stated she wants to speak with the doctor in regards of the prolia  injection before moving forward she stated she has had to have two teeth pulled since starting the prolia  injections and she is wondering if the prolia  injection is causing this to happen. Patient will let me know tomorrow if she wants to move forward with receiving prolia  injections.

## 2024-02-14 NOTE — Telephone Encounter (Signed)
 Patient is past due to Prolia  injection. Patient has appt tomorrow with Dr. Jordan. Please contact patient and advise she is over due for injection. Copay is $50 due to pharmacy that we can order to have sent to office. Please respond with patient's decision so that medication can be ordered. Please let patient know medication will not arrive by tomorrow. She will need to be scheduled approximately the beginning of next week.

## 2024-02-15 ENCOUNTER — Encounter: Payer: Self-pay | Admitting: Family Medicine

## 2024-02-15 ENCOUNTER — Ambulatory Visit: Admitting: Family Medicine

## 2024-02-15 DIAGNOSIS — G629 Polyneuropathy, unspecified: Secondary | ICD-10-CM

## 2024-02-15 DIAGNOSIS — D649 Anemia, unspecified: Secondary | ICD-10-CM

## 2024-02-15 DIAGNOSIS — A029 Salmonella infection, unspecified: Secondary | ICD-10-CM

## 2024-02-15 DIAGNOSIS — M816 Localized osteoporosis [Lequesne]: Secondary | ICD-10-CM

## 2024-02-15 LAB — BASIC METABOLIC PANEL WITH GFR
BUN: 14 mg/dL (ref 6–23)
CO2: 28 meq/L (ref 19–32)
Calcium: 9.8 mg/dL (ref 8.4–10.5)
Chloride: 104 meq/L (ref 96–112)
Creatinine, Ser: 0.88 mg/dL (ref 0.40–1.20)
GFR: 60.1 mL/min
Glucose, Bld: 90 mg/dL (ref 70–99)
Potassium: 4.1 meq/L (ref 3.5–5.1)
Sodium: 141 meq/L (ref 135–145)

## 2024-02-15 LAB — CBC
HCT: 39.7 % (ref 36.0–46.0)
Hemoglobin: 13.3 g/dL (ref 12.0–15.0)
MCHC: 33.5 g/dL (ref 30.0–36.0)
MCV: 90.2 fl (ref 78.0–100.0)
Platelets: 352 10*3/uL (ref 150.0–400.0)
RBC: 4.4 Mil/uL (ref 3.87–5.11)
RDW: 13.2 % (ref 11.5–15.5)
WBC: 7.7 10*3/uL (ref 4.0–10.5)

## 2024-02-15 LAB — PHOSPHORUS: Phosphorus: 4.2 mg/dL (ref 2.3–4.6)

## 2024-02-15 LAB — VITAMIN D 25 HYDROXY (VIT D DEFICIENCY, FRACTURES): VITD: 59.93 ng/mL (ref 30.00–100.00)

## 2024-02-15 NOTE — Patient Instructions (Addendum)
 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 Do not use My Chart to request refills or for acute issues that need immediate attention. If you send a my chart message, it may take a few days to be addressed, specially if I am not in the office.  Please be sure medication list is accurate. If a new problem present, please set up appointment sooner than planned today.

## 2024-02-15 NOTE — Assessment & Plan Note (Signed)
 She is due for Prolia  injection today. She is concerned about treatment affecting her teeth, she is not having jaw pain or symptoms that suggest mandibular osteonecrosis. We discussed other treatment options and the possibility of establishing with an osteoporosis specialist, she prefers to hold on this. She will resume Prolia  when she recovers completely from recent acute illness. Continue adequate calcium  and vitamin D  supplementation.

## 2024-02-15 NOTE — Addendum Note (Signed)
 Addended by: DIONISIO COLLIE PARAS on: 02/15/2024 11:19 AM   Modules accepted: Orders

## 2024-02-15 NOTE — Progress Notes (Unsigned)
 "  Chief Complaint  Patient presents with   Hospitalization Follow-up    Dehydrated    [name] is a 84 y.o. female  Discussed the use of AI scribe software for clinical note transcription with the patient, who gave verbal consent to proceed.  History of Present Illness Tanya Swanson is an 84 year old female with PMHx significant for GERD,peripheral neuropathy,and osteoporosis, who is here today to follow on recent hospitalization. She was seen here on 02/01/2024. Hospitalized from 02/02/2024 to 02/05/2024. She was discharged home. TOC call on 02/06/24. No new medications.  Presented today ED on the date of admission complaining of diarrhea and nausea as well as abdominal discomfort and decreased appetite.  During her hospital stay, multiple stool tests were conducted, all of which were negative except for Salmonella.  Initial evaluation suggestive of mild dehydration, treated initially with IV fluids. Abdominal/pelvis CT done on 02/02/24 revealed colitis on the left side with no abscess or other complications. 1. Mild wall thickening with mucosal enhancement throughout the descending and sigmoid colon and including the rectum, consistent with an infectious or inflammatory proctocolitis. 2. Sigmoid diverticulosis. No changes of acute diverticulitis.  Post-discharge, she continued to experience diarrhea for a few more days and has resolved. She is back to her baseline, she resumed Miralax to maintain regularity.  She recalls a potential source of the Salmonella infection, noting that her husband had a [employer] sandwich while she had nuggets, which was the only difference in their meals prior to symptoms onset.   Home health services were not deemed necessary. Mild and stable gait, she is now using a cane or a walker for assistance.  She feels significantly fatigued and weak since her hospital stay.   No fever, chills, abdominal pain, nausea, vomiting, or  urinary symptoms.   Lab Results  Component Value Date   NA 141 02/05/2024   CL 109 02/05/2024   K 3.9 02/05/2024   CO2 22 02/05/2024   BUN 6 (L) 02/05/2024   CREATININE 0.71 02/05/2024   GFRNONAA >60 02/05/2024   CALCIUM  8.6 (L) 02/05/2024   ALBUMIN 3.7 02/02/2024   GLUCOSE 102 (H) 02/05/2024   Mild anemia. She has not noted blood in the stool or melena. Lab Results  Component Value Date   WBC 6.0 02/03/2024   HGB 11.3 (L) 02/03/2024   HCT 34.8 (L) 02/03/2024   MCV 91.8 02/03/2024   PLT 208 02/03/2024   Component Ref Range & Units (hover) 13 d ago  C Diff antigen NEGATIVE  C Diff toxin NEGATIVE  C Diff interpretation No C. difficile detected.  Gastrointestinal panel by PCR was negative except for the presence of Salmonella species.  Component Ref Range & Units (hover) 2 wk ago  MICRO NUMBER: [id number]  SPECIMEN QUALITY: Adequate  Sample Source URINE  STATUS: FINAL  Result: No Growth   Lab Results  Component Value Date   VD25OH 74.52 11/21/2023   She has not yet started taking Lyrica  25 mg, which was prescribed last visit for her neuropathy.  Osteoporosis: She discusses concerns about her Prolia  treatment, noting dental issues since starting the medication, but she has not experienced jaw pain.  Review of Systems  Constitutional:  Positive for activity change, appetite change and fatigue. Negative for diaphoresis and fever.  HENT:  Negative for sore throat.   Respiratory:  Negative for cough, shortness of breath and wheezing.   Cardiovascular:  Negative for chest pain, palpitations and leg swelling.  Endocrine: Negative for  cold intolerance and heat intolerance.  Genitourinary:  Negative for decreased urine volume, dysuria and hematuria.  Skin:  Negative for rash.  Neurological:  Negative for syncope, facial asymmetry and headaches.  Psychiatric/Behavioral:  Negative for confusion and hallucinations.   See other pertinent positives and negatives in  HPI.  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   Blood in stool    Chicken pox    Chronic kidney disease    UTI and kidneystones   Constipation    unknown urology procedure 2006   Hiatal hernia    Kidney stones    Pneumonia 1968   Urinary tract infection    Allergies[2]  Social History   Socioeconomic History   Marital status: Married    Spouse name: Not on file   Number of children: Not on file   Years of education: Not on file   Highest education level: Not on file  Occupational History   Not on file  Tobacco Use   Smoking status: Never   Smokeless tobacco: Never  Vaping Use   Vaping status: Never Used  Substance and Sexual Activity   Alcohol use: No   Drug use: No   Sexual activity: Not Currently  Other Topics Concern   Not on file  Social History Narrative   Not on file   Social Drivers of Health   Tobacco Use: Low Risk (02/15/2024)   Patient History    Smoking Tobacco Use: Never    Smokeless Tobacco Use: Never    Passive Exposure: Not on file  Financial Resource Strain: Low Risk (08/22/2023)   Overall Financial Resource Strain (CARDIA)    Difficulty of Paying Living Expenses: Not hard at all  Food Insecurity: No Food Insecurity (02/02/2024)   Epic    Worried About Radiation Protection Practitioner of Food in the Last Year: Never true    Ran Out of Food in the Last Year: Never true  Transportation Needs: No Transportation Needs (02/02/2024)   Epic    Lack of Transportation (Medical): No    Lack of Transportation (Non-Medical): No  Physical Activity: Inactive (08/22/2023)   Exercise Vital Sign    Days of Exercise per Week: 0 days    Minutes of Exercise per Session: 0 min  Stress: No Stress Concern Present (08/22/2023)   [employer] of Occupational Health - Occupational Stress Questionnaire    Feeling of Stress: Not at all  Social Connections: Socially Integrated (02/02/2024)    Social Connection and Isolation Panel    Frequency of Communication with Friends and Family: More than three times a week    Frequency of Social Gatherings with Friends and Family: More than three times a week    Attends Religious Services: More than 4 times per year    Active Member of Clubs or Organizations: Yes    Attends Banker Meetings: More than 4 times per year    Marital Status: Married  Depression (PHQ2-9): Low Risk (11/21/2023)   Depression (PHQ2-9)    PHQ-2 Score: 1  Alcohol Screen: Low Risk (08/22/2023)   Alcohol Screen    Last Alcohol Screening Score (AUDIT): 0  Housing: Low Risk (02/02/2024)   Epic    Unable to Pay for Housing in the Last Year: No    Number of Times Moved in the Last Year: 0    Homeless in the Last Year:  No  Utilities: Not At Risk (02/02/2024)   Epic    Threatened with loss of utilities: No  Health Literacy: Adequate Health Literacy (08/22/2023)   B1300 Health Literacy    Frequency of need for help with medical instructions: Never   Vitals:   02/15/24 1030  BP: 120/78  Pulse: 91  Resp: 16  Temp: 98.2 F (36.8 C)  SpO2: 95%   Body mass index is 25.7 kg/m.  Physical Exam Vitals and nursing note reviewed.  Constitutional:      General: She is not in acute distress.    Appearance: She is well-developed.  HENT:     Head: Normocephalic and atraumatic.     Mouth/Throat:     Mouth: Mucous membranes are moist.     Pharynx: Oropharynx is clear.  Eyes:     Conjunctiva/sclera: Conjunctivae normal.  Cardiovascular:     Rate and Rhythm: Normal rate and regular rhythm.     Pulses:          Dorsalis pedis pulses are 2+ on the right side and 2+ on the left side.     Heart sounds: No murmur heard. Pulmonary:     Effort: Pulmonary effort is normal. No respiratory distress.     Breath sounds: Normal breath sounds.  Abdominal:     Palpations: Abdomen is soft. There is no hepatomegaly or mass.     Tenderness: There is no abdominal  tenderness.  Lymphadenopathy:     Cervical: No cervical adenopathy.  Skin:    General: Skin is warm.     Findings: No erythema or rash.  Neurological:     General: No focal deficit present.     Mental Status: She is alert and oriented to person, place, and time.     Comments: Mildly unstable gait, not assisted.  Psychiatric:        Mood and Affect: Mood and affect normal.   ASSESSMENT AND PLAN:  Tanya Swanson was seen today for hospitalization follow-up.  Diagnoses and all orders for this visit:  Orders Placed This Encounter  Procedures   Basic metabolic panel with GFR   VITAMIN D  25 Hydroxy (Vit-D Deficiency, Fractures)   Parathyroid hormone, intact (no Ca)   Phosphorus   Calcium , ionized   CBC   *** Hypocalcemia Mild. Corrected calcium  slightly under normal range. Currently she is on vitamin D  supplementation 2000 units daily. She is holding on Prolia  for now, planning on resuming treatment after she recovers from acute illness. Further recommendation will be given according to lab results.  -     Basic metabolic panel with GFR; Future -     VITAMIN D  25 Hydroxy (Vit-D Deficiency, Fractures); Future -     Parathyroid hormone, intact (no Ca); Future -     Phosphorus; Future -     Calcium , ionized; Future -     CBC; Future  Normocytic anemia Mild. ? Dilutional due to IVF given during hospitalization. Further recommendation will be given according to CBC results.  Salmonella infection Assessment & Plan: We discussed diagnosis, prognosis, and treatment. Symptoms have resolved. Continue adequate hand hygiene and adequate hydration. No further workup is needed at this time.  Localized osteoporosis without current pathological fracture Assessment & Plan: She is due for Prolia  injection today. She is concerned about treatment affecting her teeth, she is not having jaw pain or symptoms that suggest mandibular osteonecrosis. We discussed other treatment options  and the possibility of establishing with an osteoporosis specialist, she  prefers to hold on this. She will resume Prolia  when she recovers completely from recent acute illness. Fall precautions discussed. Continue adequate calcium  and vitamin D  supplementation.  Peripheral polyneuropathy Assessment & Plan: Is stable. She has not started Lyrica  yet, 25 mg at bedtime was recommended last visit. She will start medication when she feels better, recovering from recent acute illness.  I personally spent a total of 40 minutes in the care of the patient today including preparing to see the patient, getting/reviewing separately obtained history, performing a medically appropriate exam/evaluation, counseling and educating, placing orders, and documenting clinical information in the EHR.  Return if symptoms worsen or fail to improve, for keep next appointment.  Tanya Braaksma G. Tyran Huser, MD  [facility]. [place] office.     [1]  Current Outpatient Medications on File Prior to Visit  Medication Sig Dispense Refill   Acetaminophen  (TYLENOL  PO) Take 500 mg by mouth as needed.     Biotin 5 MG TABS Take by mouth.     Cholecalciferol (VITAMIN D3) 50 MCG (2000 UT) capsule Take by mouth.     cycloSPORINE (RESTASIS) 0.05 % ophthalmic emulsion Place 1 drop into both eyes 2 (two) times daily.     Famotidine (PEPCID PO) Take by mouth daily.     polyethylene glycol (MIRALAX / GLYCOLAX) packet Take 17 g by mouth daily as needed. For constipation.     Probiotic Product (PROBIOTIC DAILY PO) Take by mouth daily.     Calcium -Vitamin D -Vitamin K 500-100-40 MG-UNT-MCG CHEW Chew by mouth. (Patient not taking: Reported on 02/15/2024)     OVER THE COUNTER MEDICATION Taking [facility] feet and nerves. Take 1 capsule in a.m and 1 p.m (Patient not taking: Reported on 02/15/2024)     pregabalin  (LYRICA ) 25 MG capsule Take 1 capsule (25 mg total) by mouth at bedtime. (Patient not taking: Reported on 02/15/2024) 30  capsule 0   Current Facility-Administered Medications on File Prior to Visit  Medication Dose Route Frequency Provider Last Rate Last Admin   denosumab  (PROLIA ) injection 60 mg  60 mg Subcutaneous Q6 months Lacreasha Hinds, Dickey MATSU, MD      [2]  Allergies Allergen Reactions   Penicillins Other (See Comments)    Has patient had a PCN reaction causing immediate rash, facial/tongue/throat swelling, SOB or lightheadedness with hypotension: Yes Has patient had a PCN reaction causing severe rash involving mucus membranes or skin necrosis: No Has patient had a PCN reaction that required hospitalization No Has patient had a PCN reaction occurring within the last 10 years: No If all of the above answers are NO, then may proceed with Cephalosporin use. Passed out Has patient had a PCN reaction causing immediate rash, facial/tongue/throat swelling, SOB or lightheadedness with hypotension: Yes Has patient had a PCN reaction causing severe rash involving mucus membranes or skin necrosis: No Has patient had a PCN reaction that required hospitalization No Has patient had a PCN reaction occurring within the last 10 years: No If all of the above answers are NO, then may proceed with Cephalosporin use. Passed out fainted Has patient had a PCN reaction causing immediate rash, facial/tongue/throat swelling, SOB or lightheadedness with hypotension: Yes Has patient had a PCN reaction causing severe rash involving mucus membranes or skin necrosis: No Has patient had a PCN reaction that required hospitalization No Has patient had a PCN reaction occurring within the last 10 years: No If all of the above answers are NO, then may proceed with Cephalosporin use. Passed out  fainted Has patient had a PCN reaction causing immediate rash, facial/tongue/throat swelling, SOB or lightheadedness with hypotension: Yes Has patient had a PCN reaction causing severe rash involving mucus membranes or skin necrosis: No Has  patient had a PCN reaction that required hospitalization No Has patient had a PCN reaction occurring within the last 10 years: No If all of the above answers are NO, then may proceed with Cephalosporin use. Passed out Has patient had a PCN reaction causing immediate rash, facial/tongue/throat swelling, SOB or lightheadedness with hypotension: Yes Has patient had a PCN reaction causing severe rash involving mucus membranes or skin necrosis: No Has patient had a PCN reaction that required hospitalization No Has patient had a PCN reaction occurring within the last 10 years: No If all of the above answers are NO, then may proceed with Cephalosporin use. Passed out fainted   "

## 2024-02-15 NOTE — Progress Notes (Signed)
Patient declined medication at this time.

## 2024-02-15 NOTE — Telephone Encounter (Signed)
 Patient was seen today by Dr. Jordan and decided to hold off on the PROlia  injection at this time.

## 2024-02-15 NOTE — Assessment & Plan Note (Signed)
 We discussed diagnosis, prognosis, and treatment. Symptoms have resolved. Continue adequate hand hygiene and adequate hydration. No further workup is needed at this time.

## 2024-02-15 NOTE — Assessment & Plan Note (Signed)
 Is stable. She has not started Lyrica  yet, 25 mg at bedtime was recommended last visit. She will start medication when she feels better, recovering from recent acute illness.

## 2024-02-16 ENCOUNTER — Ambulatory Visit: Payer: Self-pay | Admitting: Family Medicine

## 2024-02-16 LAB — PARATHYROID HORMONE, INTACT (NO CA): PTH: 20 pg/mL (ref 16–77)

## 2024-02-16 LAB — CALCIUM, IONIZED: Calcium, Ion: 5.3 mg/dL (ref 4.7–5.5)

## 2024-08-24 ENCOUNTER — Ambulatory Visit

## 2024-08-27 ENCOUNTER — Ambulatory Visit
